# Patient Record
Sex: Male | Born: 1950
Health system: Southern US, Community
[De-identification: ages and names within clinical notes are randomized; demographics above are authoritative.]

## PROBLEM LIST (undated history)

## (undated) DIAGNOSIS — I10 Essential (primary) hypertension: Secondary | ICD-10-CM

## (undated) DIAGNOSIS — F1027 Alcohol dependence with alcohol-induced persisting dementia: Secondary | ICD-10-CM

## (undated) DIAGNOSIS — R569 Unspecified convulsions: Secondary | ICD-10-CM

## (undated) DIAGNOSIS — Z720 Tobacco use: Secondary | ICD-10-CM

## (undated) DIAGNOSIS — D649 Anemia, unspecified: Secondary | ICD-10-CM

## (undated) DIAGNOSIS — K2990 Gastroduodenitis, unspecified, without bleeding: Secondary | ICD-10-CM

## (undated) DIAGNOSIS — F1010 Alcohol abuse, uncomplicated: Secondary | ICD-10-CM

## (undated) DIAGNOSIS — R911 Solitary pulmonary nodule: Secondary | ICD-10-CM

## (undated) HISTORY — DX: Alcohol dependence with alcohol-induced persisting dementia: F10.27

## (undated) HISTORY — DX: Anemia, unspecified: D64.9

## (undated) HISTORY — DX: Unspecified convulsions: R56.9

## (undated) HISTORY — PX: ANKLE FRACTURE SURGERY: SHX122

## (undated) HISTORY — DX: Solitary pulmonary nodule: R91.1

## (undated) HISTORY — PX: APPENDECTOMY: SHX54

## (undated) HISTORY — DX: Alcohol abuse, uncomplicated: F10.10

## (undated) HISTORY — DX: Gastroduodenitis, unspecified, without bleeding: K29.90

## (undated) HISTORY — DX: Essential (primary) hypertension: I10

## (undated) HISTORY — DX: Tobacco use: Z72.0

---
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
## 2005-07-25 HISTORY — PX: COLONOSCOPY: SHX174

## 2015-01-23 HISTORY — PX: COLONOSCOPY: SHX174

## 2016-09-20 ENCOUNTER — Encounter: Payer: Self-pay | Admitting: Orthopaedic Surgery

## 2016-09-20 ENCOUNTER — Ambulatory Visit (INDEPENDENT_AMBULATORY_CARE_PROVIDER_SITE_OTHER): Payer: Medicare Other | Admitting: Orthopaedic Surgery

## 2016-09-20 ENCOUNTER — Ambulatory Visit (INDEPENDENT_AMBULATORY_CARE_PROVIDER_SITE_OTHER): Payer: Medicare Other

## 2016-09-20 VITALS — BP 181/111 | HR 97 | Temp 97.3°F | Ht 73.0 in | Wt 145.0 lb

## 2016-09-20 DIAGNOSIS — F17210 Nicotine dependence, cigarettes, uncomplicated: Secondary | ICD-10-CM | POA: Diagnosis not present

## 2016-09-20 DIAGNOSIS — M25561 Pain in right knee: Secondary | ICD-10-CM

## 2016-09-20 MED ORDER — NAPROXEN 500 MG PO TABS: 500.0000 mg | ORAL_TABLET | Freq: Two times a day (BID) | ORAL | 5 refills | Status: DC

## 2016-09-20 NOTE — Progress Notes (Signed)
Subjective:    Patient ID: Scott Jackson, male    DOB: October 27, 1950, 66 y.o.   MRN: [id number]  HPI He fell Friday, February 23rd and hurt his right knee.  He has had swelling and pain but no giving way.  He used ice and a knee sleeve and Tylenol which helped.  He is concerned about the swelling.  He has had problems with the knee on and off for years.  He has no redness, no numbness.   Review of Systems  HENT: Negative for congestion.   Respiratory: Negative for cough and shortness of breath.   Cardiovascular: Negative for chest pain and leg swelling.  Endocrine: Negative for cold intolerance.  Musculoskeletal: Positive for arthralgias, gait problem and joint swelling.  Allergic/Immunologic: Negative for environmental allergies.    He is a current smoker and not willing to quit at this time.  Past Medical History:  Diagnosis Date  . Hypertension     Past Surgical History:  Procedure Laterality Date  . ANKLE FRACTURE SURGERY    . intestininal blockage      No current outpatient prescriptions on file prior to visit.   No current facility-administered medications on file prior to visit.     Social History   Social History  . Marital status: Married    Spouse name: N/A  . Number of children: N/A  . Years of education: N/A   Occupational History  . Not on file.   Social History Main Topics  . Smoking status: Current Every Day Smoker  . Smokeless tobacco: Never Used  . Alcohol use Not on file  . Drug use: Unknown  . Sexual activity: Not on file   Other Topics Concern  . Not on file   Social History Narrative  . No narrative on file    Family History  Problem Relation Age of Onset  . Heart disease Sister     BP (!) 181/111   Pulse 97   Temp 97.3 F (36.3 C)   Ht 6\' 1"  (1.854 m)   Wt 145 lb (65.8 kg)   BMI 19.13 kg/m      Objective:   Physical Exam  Constitutional: He is oriented to person, place, and time. He appears well-developed and  well-nourished.  HENT:  Head: Normocephalic and atraumatic.  Eyes: Conjunctivae and EOM are normal. Pupils are equal, round, and reactive to light.  Neck: Normal range of motion. Neck supple.  Cardiovascular: Normal rate, regular rhythm and intact distal pulses.   Pulmonary/Chest: Effort normal.  Abdominal: Soft.  Musculoskeletal: He exhibits tenderness (he has pain of the right knee and a very large effusion.  ROM is 0 to 95 with pain.  The knee is stable.  there is no redness or distal edema.  NV intact.  Left knee negative.).  Neurological: He is alert and oriented to person, place, and time. He has normal reflexes. No cranial nerve deficit. He exhibits normal muscle tone. Coordination normal.  Skin: Skin is warm and dry.  Psychiatric: He has a normal mood and affect. His behavior is normal. Judgment and thought content normal.  Vitals reviewed.   X-rays were done of the right knee.      Assessment & Plan:   Encounter Diagnoses  Name Primary?  . Acute pain of right knee Yes  . Cigarette nicotine dependence without complication    PROCEDURE NOTE:  The patient request injection, verbal consent was obtained.  The right knee was prepped appropriately after time  out was performed.   Sterile technique was observed and anesthesia was provided by ethyl chloride and a 20-gauge needle was used to inject the knee area.  A 16-gauge needle was then used to aspirate the knee.  Color of fluid aspirated was bloody  Total cc's aspirated was 95.    Injection of 1 cc of Depo-Medrol 40 mg with several cc's of plain xylocaine was then performed.  A band aid dressing was applied.  The patient was advised to apply ice later today and tomorrow to the injection sight as needed.  Begin Naprosyn.  Return in two weeks.  Call if any problem.  Precautions discussed.  Electronically Signed Sanjuana Kava, MD 2/27/20189:50 AM

## 2016-09-20 NOTE — Patient Instructions (Addendum)
Steps to Quit Smoking Smoking tobacco can be bad for your health. It can also affect almost every organ in your body. Smoking puts you and people around you at risk for many serious long-lasting (chronic) diseases. Quitting smoking is hard, but it is one of the best things that you can do for your health. It is never too late to quit. What are the benefits of quitting smoking? When you quit smoking, you lower your risk for getting serious diseases and conditions. They can include:  Lung cancer or lung disease.  Heart disease.  Stroke.  Heart attack.  Not being able to have children (infertility).  Weak bones (osteoporosis) and broken bones (fractures). If you have coughing, wheezing, and shortness of breath, those symptoms may get better when you quit. You may also get sick less often. If you are pregnant, quitting smoking can help to lower your chances of having a baby of low birth weight. What can I do to help me quit smoking? Talk with your doctor about what can help you quit smoking. Some things you can do (strategies) include:  Quitting smoking totally, instead of slowly cutting back how much you smoke over a period of time.  Going to in-person counseling. You are more likely to quit if you go to many counseling sessions.  Using resources and support systems, such as:  Online chats with a counselor.  Phone quitlines.  Printed self-help materials.  Support groups or group counseling.  Text messaging programs.  Mobile phone apps or applications.  Taking medicines. Some of these medicines may have nicotine in them. If you are pregnant or breastfeeding, do not take any medicines to quit smoking unless your doctor says it is okay. Talk with your doctor about counseling or other things that can help you. Talk with your doctor about using more than one strategy at the same time, such as taking medicines while you are also going to in-person counseling. This can help make quitting  easier. What things can I do to make it easier to quit? Quitting smoking might feel very hard at first, but there is a lot that you can do to make it easier. Take these steps:  Talk to your family and friends. Ask them to support and encourage you.  Call phone quitlines, reach out to support groups, or work with a counselor.  Ask people who smoke to not smoke around you.  Avoid places that make you want (trigger) to smoke, such as:  Bars.  Parties.  Smoke-break areas at work.  Spend time with people who do not smoke.  Lower the stress in your life. Stress can make you want to smoke. Try these things to help your stress:  Getting regular exercise.  Deep-breathing exercises.  Yoga.  Meditating.  Doing a body scan. To do this, close your eyes, focus on one area of your body at a time from head to toe, and notice which parts of your body are tense. Try to relax the muscles in those areas.  Download or buy apps on your mobile phone or tablet that can help you stick to your quit plan. There are many free apps, such as QuitGuide from the CDC (Centers for Disease Control and Prevention). You can find more support from smokefree.gov and other websites. This information is not intended to replace advice given to you by your health care provider. Make sure you discuss any questions you have with your health care provider. Document Released: 05/07/2009 Document Revised: 03/08/2016 Document   Reviewed: 11/25/2014 Elsevier Interactive Patient Education  2017 [employer].   Note to work saying he was here today.

## 2016-10-05 ENCOUNTER — Encounter: Payer: Self-pay | Admitting: Orthopaedic Surgery

## 2016-10-05 ENCOUNTER — Ambulatory Visit (INDEPENDENT_AMBULATORY_CARE_PROVIDER_SITE_OTHER): Payer: Medicare Other | Admitting: Orthopaedic Surgery

## 2016-10-05 VITALS — BP 146/93 | HR 76 | Ht 73.0 in | Wt 140.0 lb

## 2016-10-05 DIAGNOSIS — M25561 Pain in right knee: Secondary | ICD-10-CM

## 2016-10-05 NOTE — Progress Notes (Signed)
Patient Scott Jackson, male DOB:May 12, 1951, 66 y.o. [id number]  Chief Complaint  Patient presents with  . Follow-up    RIGHT KNEE PAIN    HPI  Scott Jackson is a 66 y.o. male who had acute pain of the right knee. He had an injection last time.  He is significantly improved. He has no pain now, normal gait. He is pleased. HPI  Body mass index is 18.47 kg/m.  ROS  Review of Systems  HENT: Negative for congestion.   Respiratory: Negative for cough and shortness of breath.   Cardiovascular: Negative for chest pain and leg swelling.  Endocrine: Negative for cold intolerance.  Musculoskeletal: Positive for arthralgias, gait problem and joint swelling.  Allergic/Immunologic: Negative for environmental allergies.    Past Medical History:  Diagnosis Date  . Hypertension     Past Surgical History:  Procedure Laterality Date  . ANKLE FRACTURE SURGERY    . intestininal blockage      Family History  Problem Relation Age of Onset  . Heart disease Sister     Social History Social History  Substance Use Topics  . Smoking status: Current Every Day Smoker  . Smokeless tobacco: Never Used  . Alcohol use Not on file    No Known Allergies  Current Outpatient Prescriptions  Medication Sig Dispense Refill  . amLODipine (NORVASC) 5 MG tablet 5 mg.   0  . aspirin 81 MG chewable tablet Chew by mouth daily.    Marland Kitchen lisinopril (PRINIVIL,ZESTRIL) 20 MG tablet 20 mg.   0  . naproxen (NAPROSYN) 500 MG tablet Take 1 tablet (500 mg total) by mouth 2 (two) times daily with a meal. 60 tablet 5   No current facility-administered medications for this visit.      Physical Exam  Blood pressure (!) 146/93, pulse 76, height 6\' 1"  (1.854 m), weight 140 lb (63.5 kg).  Constitutional: overall normal hygiene, normal nutrition, well developed, normal grooming, normal body habitus. Assistive device:none  Musculoskeletal: gait and station Limp none, muscle tone and strength are normal, no  tremors or atrophy is present.  .  Neurological: coordination overall normal.  Deep tendon reflex/nerve stretch intact.  Sensation normal.  Cranial nerves II-XII intact.   Skin:   Normal overall no scars, lesions, ulcers or rashes. No psoriasis.  Psychiatric: Alert and oriented x 3.  Recent memory intact, remote memory unclear.  Normal mood and affect. Well groomed.  Good eye contact.  Cardiovascular: overall no swelling, no varicosities, no edema bilaterally, normal temperatures of the legs and arms, no clubbing, cyanosis and good capillary refill.  Lymphatic: palpation is normal.  He has full motion of the right knee and no pain.  Exam negative.  The patient has been educated about the nature of the problem(s) and counseled on treatment options.  The patient appeared to understand what I have discussed and is in agreement with it.  Encounter Diagnosis  Name Primary?  . Acute pain of right knee Yes    PLAN Call if any problems.  Precautions discussed.  Continue current medications.   Return to clinic PRN   Electronically Signed Sanjuana Kava, MD 3/14/20183:32 PM

## 2016-10-05 NOTE — Patient Instructions (Signed)
Steps to Quit Smoking Smoking tobacco can be bad for your health. It can also affect almost every organ in your body. Smoking puts you and people around you at risk for many serious long-lasting (chronic) diseases. Quitting smoking is hard, but it is one of the best things that you can do for your health. It is never too late to quit. What are the benefits of quitting smoking? When you quit smoking, you lower your risk for getting serious diseases and conditions. They can include:  Lung cancer or lung disease.  Heart disease.  Stroke.  Heart attack.  Not being able to have children (infertility).  Weak bones (osteoporosis) and broken bones (fractures). If you have coughing, wheezing, and shortness of breath, those symptoms may get better when you quit. You may also get sick less often. If you are pregnant, quitting smoking can help to lower your chances of having a baby of low birth weight. What can I do to help me quit smoking? Talk with your doctor about what can help you quit smoking. Some things you can do (strategies) include:  Quitting smoking totally, instead of slowly cutting back how much you smoke over a period of time.  Going to in-person counseling. You are more likely to quit if you go to many counseling sessions.  Using resources and support systems, such as:  Online chats with a counselor.  Phone quitlines.  Printed self-help materials.  Support groups or group counseling.  Text messaging programs.  Mobile phone apps or applications.  Taking medicines. Some of these medicines may have nicotine in them. If you are pregnant or breastfeeding, do not take any medicines to quit smoking unless your doctor says it is okay. Talk with your doctor about counseling or other things that can help you. Talk with your doctor about using more than one strategy at the same time, such as taking medicines while you are also going to in-person counseling. This can help make quitting  easier. What things can I do to make it easier to quit? Quitting smoking might feel very hard at first, but there is a lot that you can do to make it easier. Take these steps:  Talk to your family and friends. Ask them to support and encourage you.  Call phone quitlines, reach out to support groups, or work with a counselor.  Ask people who smoke to not smoke around you.  Avoid places that make you want (trigger) to smoke, such as:  Bars.  Parties.  Smoke-break areas at work.  Spend time with people who do not smoke.  Lower the stress in your life. Stress can make you want to smoke. Try these things to help your stress:  Getting regular exercise.  Deep-breathing exercises.  Yoga.  Meditating.  Doing a body scan. To do this, close your eyes, focus on one area of your body at a time from head to toe, and notice which parts of your body are tense. Try to relax the muscles in those areas.  Download or buy apps on your mobile phone or tablet that can help you stick to your quit plan. There are many free apps, such as QuitGuide from the CDC (Centers for Disease Control and Prevention). You can find more support from smokefree.gov and other websites. This information is not intended to replace advice given to you by your health care provider. Make sure you discuss any questions you have with your health care provider. Document Released: 05/07/2009 Document Revised: 03/08/2016 Document   Reviewed: 11/25/2014 Elsevier Interactive Patient Education  2017 [employer] Inc[employer]  

## 2017-08-18 ENCOUNTER — Ambulatory Visit (INDEPENDENT_AMBULATORY_CARE_PROVIDER_SITE_OTHER)
Admission: RE | Admit: 2017-08-18 | Discharge: 2017-08-18 | Disposition: A | Discharge status: Home / Self Care | Payer: Medicare PPO | Source: Ambulatory Visit | Attending: Pulmonary Disease | Admitting: Pulmonary Disease

## 2017-08-18 ENCOUNTER — Ambulatory Visit (INDEPENDENT_AMBULATORY_CARE_PROVIDER_SITE_OTHER): Payer: Medicare PPO | Admitting: Pulmonary Disease

## 2017-08-18 ENCOUNTER — Encounter: Payer: Self-pay | Admitting: Pulmonary Disease

## 2017-08-18 VITALS — BP 144/90 | HR 89 | Ht 73.0 in | Wt 145.0 lb

## 2017-08-18 DIAGNOSIS — R0602 Shortness of breath: Secondary | ICD-10-CM

## 2017-08-18 MED ORDER — FLUTICASONE PROPIONATE 50 MCG/ACT NA SUSP: 2.0000 | Freq: Every day | NASAL | 2 refills | Status: DC

## 2017-08-18 MED ORDER — CHLORPHENIRAMINE MALEATE 4 MG PO TABS: 4.0000 mg | ORAL_TABLET | Freq: Three times a day (TID) | ORAL | 0 refills | Status: DC

## 2017-08-18 NOTE — Patient Instructions (Signed)
Please schedule you for pulmonary function test and chest x-ray Continue to work on smoking cessation You can use chlorpheniramine 3 times a day over-the-counter and Flonase nasal spray for sinus congestion and postnasal drip Follow-up in 1 month

## 2017-08-18 NOTE — Progress Notes (Signed)
Scott Jackson    [id number]    1951-05-08  Primary Care Physician:Patient, No Pcp Per  Referring Physician: No referring provider defined for this encounter.  Chief complaint: Consult for evaluation of the lung  HPI: 67 year old with extensive smoking history, hypertension.  Complains of sinus congestion, postnasal drip.  Throat congestion.  Denies any cough, sputum production, fevers, chills.  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  Continues to smoke half pack per day Travel History: Not significant  Outpatient Encounter Medications as of 08/18/2017  Medication Sig  . amLODipine (NORVASC) 5 MG tablet 5 mg.   . aspirin 81 MG chewable tablet Chew by mouth daily.  Marland Kitchen lisinopril (PRINIVIL,ZESTRIL) 20 MG tablet 20 mg.   . naproxen (NAPROSYN) 500 MG tablet Take 1 tablet (500 mg total) by mouth 2 (two) times daily with a meal.   No facility-administered encounter medications on file as of 08/18/2017.     Allergies as of 08/18/2017  . (No Known Allergies)    Past Medical History:  Diagnosis Date  . Hypertension     Past Surgical History:  Procedure Laterality Date  . ANKLE FRACTURE SURGERY    . intestininal blockage      Family History  Problem Relation Age of Onset  . Heart disease Sister     Social History   Socioeconomic History  . Marital status: Married    Spouse name: Not on file  . Number of children: Not on file  . Years of education: Not on file  . Highest education level: Not on file  Social Needs  . Financial resource strain: Not on file  . Food insecurity - worry: Not on file  . Food insecurity - inability: Not on file  . Transportation needs - medical: Not on file  . Transportation  needs - non-medical: Not on file  Occupational History  . Not on file  Tobacco Use  . Smoking status: Current Every Day Smoker    Packs/day: 0.50    Years: 40.00    Pack years: 20.00  . Smokeless tobacco: Never Used  Substance and Sexual Activity  . Alcohol use: Yes    Alcohol/week: 2.4 oz    Types: 4 Shots of liquor per week  . Drug use: No  . Sexual activity: Not on file  Other Topics Concern  . Not on file  Social History Narrative  . Not on file   Review of systems: Review of Systems  Constitutional: Negative for fever and chills.  HENT: Negative.   Eyes: Negative for blurred vision.  Respiratory: as per HPI  Cardiovascular: Negative for chest pain and palpitations.  Gastrointestinal: Negative for vomiting, diarrhea, blood per rectum. Genitourinary: Negative for dysuria, urgency, frequency and hematuria.  Musculoskeletal: Negative for myalgias, back pain and joint pain.  Skin: Negative for itching and rash.  Neurological: Negative for dizziness, tremors, focal weakness, seizures and loss of consciousness.  Endo/Heme/Allergies: Negative for environmental allergies.  Psychiatric/Behavioral: Negative for depression, suicidal ideas and hallucinations.  All other systems reviewed and are negative.  Physical Exam: Blood pressure (!) 144/90, pulse 89, SpO2 98 %. Gen:      No acute distress HEENT:  EOMI, sclera anicteric  Neck:     No masses; no thyromegaly Lungs:    Clear to auscultation bilaterally; normal respiratory effort CV:         Regular rate and rhythm; no murmurs Abd:      + bowel sounds; soft, non-tender; no palpable masses, no distension Ext:    No edema; adequate peripheral perfusion Skin:      Warm and dry; no rash Neuro: alert and oriented x 3 Psych: normal mood and affect  Data Reviewed:   Assessment:  Evaluation for COPD Schedule chest x-ray and pulmonary function test. He is not very symptomatic and will avoid putting him on standing inhalers for  now. I have advised him to use over-the-counter antihistamine and Flonase nasal spray for postnasal drip and sinus congestion.  Active smoker Smoking cessation encouraged.  He is not interested in quitting.  Plan/Recommendations: - Chest x-ray, PFTs - Smoking cessation.  Marshell Garfinkel MD [facility] Pager [phone] 08/18/2017, 1:34 PM  CC: No ref. provider found

## 2017-08-22 ENCOUNTER — Telehealth: Payer: Self-pay | Admitting: Pulmonary Disease

## 2017-08-22 NOTE — Telephone Encounter (Signed)
Notes recorded by Marshell Garfinkel, MD on 08/20/2017 at 9:53 AM EST Please let the pt know that the CXR shows mild scarring that is likely chronic. No lung infiltrate or nodules  Pt's daughter [name] is aware of results and voiced her understanding. Nothing further is needed

## 2017-09-21 ENCOUNTER — Ambulatory Visit: Payer: Medicare PPO | Admitting: Pulmonary Disease

## 2017-10-19 ENCOUNTER — Encounter: Payer: Self-pay | Admitting: Pulmonary Disease

## 2017-10-19 ENCOUNTER — Ambulatory Visit: Payer: Medicare HMO | Admitting: Pulmonary Disease

## 2017-10-19 ENCOUNTER — Ambulatory Visit (INDEPENDENT_AMBULATORY_CARE_PROVIDER_SITE_OTHER): Payer: Medicare HMO | Admitting: Pulmonary Disease

## 2017-10-19 VITALS — BP 130/70 | HR 75 | Ht 72.0 in | Wt 141.0 lb

## 2017-10-19 DIAGNOSIS — R0602 Shortness of breath: Secondary | ICD-10-CM

## 2017-10-19 DIAGNOSIS — F17210 Nicotine dependence, cigarettes, uncomplicated: Secondary | ICD-10-CM | POA: Diagnosis not present

## 2017-10-19 DIAGNOSIS — F17200 Nicotine dependence, unspecified, uncomplicated: Secondary | ICD-10-CM

## 2017-10-19 DIAGNOSIS — R69 Illness, unspecified: Secondary | ICD-10-CM | POA: Diagnosis not present

## 2017-10-19 LAB — PULMONARY FUNCTION TEST
DL/VA % PRED: 87 %
DL/VA: 4.14 ml/min/mmHg/L
DLCO unc % pred: 76 %
DLCO unc: 26.7 ml/min/mmHg
FEF 25-75 POST: 2.1 L/s
FEF 25-75 Pre: 1.85 L/sec
FEF2575-%Change-Post: 13 %
FEF2575-%PRED-POST: 74 %
FEF2575-%PRED-PRE: 65 %
FEV1-%Change-Post: 2 %
FEV1-%Pred-Post: 87 %
FEV1-%Pred-Pre: 85 %
FEV1-PRE: 2.75 L
FEV1-Post: 2.83 L
FEV1FVC-%CHANGE-POST: 4 %
FEV1FVC-%PRED-PRE: 91 %
FEV6-%Change-Post: 0 %
FEV6-%Pred-Post: 94 %
FEV6-%Pred-Pre: 95 %
FEV6-Post: 3.84 L
FEV6-Pre: 3.87 L
FEV6FVC-%Change-Post: 0 %
FEV6FVC-%Pred-Post: 103 %
FEV6FVC-%Pred-Pre: 103 %
FVC-%Change-Post: -1 %
FVC-%PRED-PRE: 92 %
FVC-%Pred-Post: 91 %
FVC-POST: 3.85 L
FVC-PRE: 3.89 L
POST FEV1/FVC RATIO: 74 %
PRE FEV1/FVC RATIO: 71 %
Post FEV6/FVC ratio: 100 %
Pre FEV6/FVC Ratio: 99 %
RV % pred: 121 %
RV: 3.03 L
TLC % pred: 91 %
TLC: 6.76 L

## 2017-10-19 NOTE — Progress Notes (Signed)
PFT done today. 

## 2017-10-19 NOTE — Patient Instructions (Addendum)
Your lung function test show very mild changes of COPD Please work on quitting smoking as we do not want your lung function to decline further We will refer you for low-dose screening CT of the chest Follow-up in 6 months.

## 2017-10-19 NOTE — Progress Notes (Signed)
Scott Jackson    [id number]    Mar 20, 1951  Primary Care Physician:Pradhan, Tilden Fossa, MD  Chief complaint: Follow-up for COPD  HPI: 67 year old with extensive smoking history, hypertension.  Complains of sinus congestion, postnasal drip.  Throat congestion.  Denies any cough, sputum production, fevers, chills.  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  Continues to smoke half pack per day Travel History: Not significant  Interim history: He remains asymptomatic with no issues.  He is here for review of PFTs  Outpatient Encounter Medications as of 10/19/2017  Medication Sig  . amLODipine (NORVASC) 5 MG tablet 5 mg.   . aspirin 81 MG chewable tablet Chew by mouth daily.  . chlorpheniramine (CHLOR-TRIMETON) 4 MG tablet Take 1 tablet (4 mg total) by mouth 3 (three) times daily.  . fluticasone (FLONASE) 50 MCG/ACT nasal spray Place 2 sprays into both nostrils daily.  Marland Kitchen lisinopril (PRINIVIL,ZESTRIL) 20 MG tablet 20 mg.   . naproxen (NAPROSYN) 500 MG tablet Take 1 tablet (500 mg total) by mouth 2 (two) times daily with a meal.   No facility-administered encounter medications on file as of 10/19/2017.     Allergies as of 10/19/2017  . (No Known Allergies)    Past Medical History:  Diagnosis Date  . Hypertension     Past Surgical History:  Procedure Laterality Date  . ANKLE FRACTURE SURGERY    . intestininal blockage      Family History  Problem Relation Age of Onset  . Heart disease Sister     Social History   Socioeconomic History  . Marital status: Married    Spouse name: Not on file  . Number of children: Not on file  . Years of education: Not on file  . Highest education level: Not on file    Occupational History  . Not on file  Social Needs  . Financial resource strain: Not on file  . Food insecurity:    Worry: Not on file    Inability: Not on file  . Transportation needs:    Medical: Not on file    Non-medical: Not on file  Tobacco Use  . Smoking status: Current Every Day Smoker    Packs/day: 0.50    Years: 40.00    Pack years: 20.00  . Smokeless tobacco: Never Used  Substance and Sexual Activity  . Alcohol use: Yes    Alcohol/week: 2.4 oz    Types: 4 Shots of liquor per week  . Drug use: No  . Sexual activity: Not on file  Lifestyle  . Physical activity:    Days per week: Not on file    Minutes per session: Not on file  . Stress: Not on file  Relationships  . Social connections:    Talks on phone: Not on file    Gets together: Not on file    Attends religious service: Not on file    Active member of club or organization: Not on file    Attends meetings of clubs or organizations: Not on file    Relationship status: Not on file  . Intimate partner violence:    Fear of current or ex partner: Not on file  Emotionally abused: Not on file    Physically abused: Not on file    Forced sexual activity: Not on file  Other Topics Concern  . Not on file  Social History Narrative  . Not on file   Review of systems: Review of Systems  Constitutional: Negative for fever and chills.  HENT: Negative.   Eyes: Negative for blurred vision.  Respiratory: as per HPI  Cardiovascular: Negative for chest pain and palpitations.  Gastrointestinal: Negative for vomiting, diarrhea, blood per rectum. Genitourinary: Negative for dysuria, urgency, frequency and hematuria.  Musculoskeletal: Negative for myalgias, back pain and joint pain.  Skin: Negative for itching and rash.  Neurological: Negative for dizziness, tremors, focal weakness, seizures and loss of consciousness.  Endo/Heme/Allergies: Negative for environmental allergies.  Psychiatric/Behavioral: Negative for  depression, suicidal ideas and hallucinations.  All other systems reviewed and are negative.  Physical Exam: Blood pressure 130/70, pulse 75, height 6' (1.829 m), weight 141 lb (64 kg), SpO2 97 %. Gen:      No acute distress HEENT:  EOMI, sclera anicteric Neck:     No masses; no thyromegaly Lungs:    Clear to auscultation bilaterally; normal respiratory effort CV:         Regular rate and rhythm; no murmurs Abd:      + bowel sounds; soft, non-tender; no palpable masses, no distension Ext:    No edema; adequate peripheral perfusion Skin:      Warm and dry; no rash Neuro: alert and oriented x 3 Psych: normal mood and affect  Data Reviewed: PFTs 10/19/17 FVC 3.85 [91%], FEV1 2.83 [87%], F/F 74, TLC 91%, RV/TLC 124%, DLCO 76% Minimal obstruction and diffusion defect  Chest x-ray 08/18/17-hyperinflation, biapical scarring.  No acute abnormality I have reviewed the images personally   Assessment:  COPD PFTs reviewed which shows only minimal obstructive changes which is surprising given his smoking history No need for inhalers as he is asymptomatic  Active smoker Smoking cessation encouraged.  Time spent counseling-5 minutes Schedule for low-dose screening CT  Plan/Recommendations: - Low-dose screening CT - Smoking cessation.  Marshell Garfinkel MD [facility] Pager [phone] 10/19/2017, 4:21 PM

## 2017-11-02 ENCOUNTER — Telehealth: Payer: Self-pay | Admitting: Pulmonary Disease

## 2017-11-03 NOTE — Telephone Encounter (Signed)
LMTC x 1  

## 2017-11-08 ENCOUNTER — Other Ambulatory Visit: Payer: Self-pay | Admitting: Acute Care

## 2017-11-08 DIAGNOSIS — F17210 Nicotine dependence, cigarettes, uncomplicated: Secondary | ICD-10-CM

## 2017-11-08 DIAGNOSIS — Z122 Encounter for screening for malignant neoplasm of respiratory organs: Secondary | ICD-10-CM

## 2017-11-08 NOTE — Telephone Encounter (Signed)
LMTC x 1 - Will close this message and refer to referral notes 

## 2017-11-08 NOTE — Telephone Encounter (Signed)
Pt daughter, [name], is returning call. Cb is [phone].

## 2017-11-23 ENCOUNTER — Other Ambulatory Visit: Payer: Self-pay

## 2017-11-23 ENCOUNTER — Telehealth: Payer: Self-pay | Admitting: Clinical

## 2017-11-23 ENCOUNTER — Ambulatory Visit (INDEPENDENT_AMBULATORY_CARE_PROVIDER_SITE_OTHER): Payer: Managed Care, Other (non HMO) | Admitting: Family Medicine

## 2017-11-23 ENCOUNTER — Encounter: Payer: Self-pay | Admitting: Family Medicine

## 2017-11-23 ENCOUNTER — Telehealth (INDEPENDENT_AMBULATORY_CARE_PROVIDER_SITE_OTHER): Payer: Managed Care, Other (non HMO) | Admitting: Clinical

## 2017-11-23 VITALS — BP 148/86 | HR 77 | Temp 98.1°F | Resp 14 | Ht 73.0 in | Wt 141.1 lb

## 2017-11-23 DIAGNOSIS — R5383 Other fatigue: Secondary | ICD-10-CM

## 2017-11-23 DIAGNOSIS — F1010 Alcohol abuse, uncomplicated: Secondary | ICD-10-CM

## 2017-11-23 DIAGNOSIS — R739 Hyperglycemia, unspecified: Secondary | ICD-10-CM | POA: Diagnosis not present

## 2017-11-23 DIAGNOSIS — Z113 Encounter for screening for infections with a predominantly sexual mode of transmission: Secondary | ICD-10-CM

## 2017-11-23 DIAGNOSIS — Z7141 Alcohol abuse counseling and surveillance of alcoholic: Secondary | ICD-10-CM

## 2017-11-23 DIAGNOSIS — F1020 Alcohol dependence, uncomplicated: Secondary | ICD-10-CM

## 2017-11-23 DIAGNOSIS — E785 Hyperlipidemia, unspecified: Secondary | ICD-10-CM | POA: Diagnosis not present

## 2017-11-23 DIAGNOSIS — Z1159 Encounter for screening for other viral diseases: Secondary | ICD-10-CM

## 2017-11-23 DIAGNOSIS — I10 Essential (primary) hypertension: Secondary | ICD-10-CM | POA: Diagnosis not present

## 2017-11-23 DIAGNOSIS — R69 Illness, unspecified: Secondary | ICD-10-CM | POA: Diagnosis not present

## 2017-11-23 DIAGNOSIS — Z23 Encounter for immunization: Secondary | ICD-10-CM | POA: Diagnosis not present

## 2017-11-23 LAB — COMPLETE METABOLIC PANEL WITH GFR
AG RATIO: 1.6 (calc) (ref 1.0–2.5)
ALBUMIN MSPROF: 4.7 g/dL (ref 3.6–5.1)
ALT: 14 U/L (ref 9–46)
AST: 27 U/L (ref 10–35)
Alkaline phosphatase (APISO): 40 U/L (ref 40–115)
BUN: 21 mg/dL (ref 7–25)
CO2: 27 mmol/L (ref 20–32)
CREATININE: 1.05 mg/dL (ref 0.70–1.25)
Calcium: 10.2 mg/dL (ref 8.6–10.3)
Chloride: 100 mmol/L (ref 98–110)
GFR, Est African American: 85 mL/min/{1.73_m2} (ref >=60)
GFR, Est Non African American: 74 mL/min/{1.73_m2} (ref >=60)
GLOBULIN: 2.9 g/dL (ref 1.9–3.7)
Glucose, Bld: 90 mg/dL (ref 65–139)
POTASSIUM: 4.6 mmol/L (ref 3.5–5.3)
SODIUM: 137 mmol/L (ref 135–146)
Total Bilirubin: 0.3 mg/dL (ref 0.2–1.2)
Total Protein: 7.6 g/dL (ref 6.1–8.1)

## 2017-11-23 LAB — CBC WITH DIFFERENTIAL/PLATELET
BASOS ABS: 31 {cells}/uL (ref 0–200)
Basophils Relative: 0.7 %
EOS ABS: 22 {cells}/uL (ref 15–500)
EOS PCT: 0.5 %
HEMATOCRIT: 38.4 % — AB (ref 38.5–50.0)
HEMOGLOBIN: 13.1 g/dL — AB (ref 13.2–17.1)
LYMPHS ABS: 2380 {cells}/uL (ref 850–3900)
MCH: 32 pg (ref 27.0–33.0)
MCHC: 34.1 g/dL (ref 32.0–36.0)
MCV: 93.7 fL (ref 80.0–100.0)
MPV: 9.2 fL (ref 7.5–12.5)
Monocytes Relative: 7.3 %
NEUTROS ABS: 1646 {cells}/uL (ref 1500–7800)
Neutrophils Relative %: 37.4 %
Platelets: 416 10*3/uL — ABNORMAL HIGH (ref 140–400)
RBC: 4.1 10*6/uL — ABNORMAL LOW (ref 4.20–5.80)
RDW: 12.9 % (ref 11.0–15.0)
Total Lymphocyte: 54.1 %
WBC: 4.4 10*3/uL (ref 3.8–10.8)
WBCMIX: 321 {cells}/uL (ref 200–950)

## 2017-11-23 LAB — TSH: TSH: 1.15 m[IU]/L (ref 0.40–4.50)

## 2017-11-23 LAB — LIPID PANEL
CHOL/HDL RATIO: 2.1 (calc) (ref <5.0)
Cholesterol: 207 mg/dL — ABNORMAL HIGH (ref <200)
HDL: 97 mg/dL (ref >40)
LDL CHOLESTEROL (CALC): 86 mg/dL
Non-HDL Cholesterol (Calc): 110 mg/dL (calc) (ref <130)
Triglycerides: 139 mg/dL (ref <150)

## 2017-11-23 NOTE — Telephone Encounter (Signed)
This [facility] specialist left message to call back with name and contact information for daughter to coordinate care.  This [facility] specialist will follow up tomorrow.

## 2017-11-23 NOTE — BH Specialist Note (Addendum)
[facility] Initial Clinical Assessment  MRN: [id number] NAME: Scott Jackson Date: 11/23/17  Start time: 11:50 AM  End time: 12:20pm Total time: 30 minutes  Type of Contact: Type of Contact: Video Visit Initial Contact  Patient consent obtained: Patient consent obtained for Virtual Visit: Yes Reason for Visit today: Reason for Your Call/Visit Today: Wants to decrease alcohol use  Scott Jackson - patient's daughter, was present during all of the video visit  Treatment History Patient recently received Inpatient Treatment: Have You Recently Been in Any Inpatient Treatment (Hospital/Detox/Crisis Center/28-Day Program)?: No  Facility/Program:    Date of discharge:   Patient currently being seen by therapist/psychiatrist: Do You Currently Have a Therapist/Psychiatrist?: No Patient currently receiving the following services: Patient Currently Receiving the Following Services:: Currently not receiving any services  Past Psychiatric History/Hospitalization(s): Anxiety: No Bipolar Disorder: Need to assess Depression: No Mania: Not assessed Psychosis: Need to assess Schizophrenia: Need to assess Personality Disorder: Need to assess Hospitalization for psychiatric illness: No History of Electroconvulsive Shock Therapy: No Prior Suicide Attempts: No  Clinical Assessment:  PHQ-9 Assessments: Depression screen [facility] 2/9 11/23/2017  Decreased Interest 0  Down, Depressed, Hopeless 0  PHQ - 2 Score 0    GAD-7 Assessments: No flowsheet data found.   Social Functioning Social maturity: Social Maturity: Responsible Social judgement: Social Judgement: Normal  Stress Current stressors: Current Stressors: Family death(Father's death many years ago) Familial stressors: Familial Stressors: None Sleep: Sleep: No problems Appetite: Appetite: Decreased Coping ability: Coping ability: Other (Comment)(Increased alcohol use since father's death) Patient taking medications as prescribed:  Patient taking medications as prescribed: Yes  Current medications:  Outpatient Encounter Medications as of 11/23/2017  Medication Sig  . amLODipine (NORVASC) 5 MG tablet 5 mg.   . aspirin 81 MG chewable tablet Chew by mouth daily.  . chlorpheniramine (CHLOR-TRIMETON) 4 MG tablet Take 1 tablet (4 mg total) by mouth 3 (three) times daily. (Patient not taking: Reported on 11/23/2017)  . Cholecalciferol (VITAMIN D3) 5000 units CAPS Take 1 capsule by mouth every other day.  . fluticasone (FLONASE) 50 MCG/ACT nasal spray Place 2 sprays into both nostrils daily.  Marland Kitchen lisinopril (PRINIVIL,ZESTRIL) 20 MG tablet 20 mg.   . lisinopril-hydrochlorothiazide (PRINZIDE,ZESTORETIC) 10-12.5 MG tablet Take 1 tablet by mouth every morning.  . naproxen (NAPROSYN) 500 MG tablet Take 1 tablet (500 mg total) by mouth 2 (two) times daily with a meal.   No facility-administered encounter medications on file as of 11/23/2017.     Self-harm Behaviors Risk Assessment Self-harm risk factors: Self-harm risk factors: Substance use disorder(Increased alcohol use) Patient endorses recent thoughts of harming self: Have you recently had any thoughts about harming yourself?: No  [place] Suicide Severity Rating Scale: No flowsheet data found.  Danger to Others Risk Assessment Danger to others risk factors: Danger to Others Risk Factors: No risk factors noted Patient endorses recent thoughts of harming others: Notification required: No need or identified person  Dynamic Appraisal of Situational Aggression (DASA): No flowsheet data found.  Substance Use Assessment Patient recently consumed alcohol: Have you recently consumed alcohol?: Yes  Alcohol Use Disorder Identification Test (AUDIT): No flowsheet data found. Patient recently used drugs: Have you recently used any drugs?: No  Opioid Risk Assessment:  Patient is concerned about dependence or abuse of substances: Does patient seem concerned about dependence or abuse of any  substance?: No  Goals, Interventions and Follow-up Plan Goals: Decrease alcohol use Interventions: Functional Assessment of ADLs and Link to [employer]  Summary of Clinical Assessment Summary:   Scott Jackson is a 67 yo male who has been using alcohol to cope with his father's death over 30 years ago. Scott Jackson also smokes cigarettes about half a pack per day, for the last 20 years.  Scott Jackson lives with 20 yo mother.  Scott Jackson has an adult daughter who assists in his care and has a 38 yo granddaughter who lives in [place].  Scott Jackson' daughter was supportive and willing to provide assistance with his treatment.  Scott Jackson works for a [employer] Monday - Wednesdays, he denied any alcohol use while at work.  During video call, Scott Jackson minimized his alcohol consumption each day to half of a fifth.  After talking with Dr. Mannie Stabile, Scott Jackson daughter reported during MD visit that Scott Jackson consumes about 2 liters of vodka each week and that he consumes alcohol every day.  Scott Jackson denied any depressive symptoms, denied any history or current SI/HI.  After consultation with Dr. Mannie Stabile after the video visit, Dr. Mannie Stabile was concerned about the patient stating he gets shaky, agitated, and sweaty when he stops drinking. Dr. Mannie Stabile would recommend inpatient services due to high risk for seizures.  Follow-up Plan:  Scott Jackson to connect [name] to appropriate services to address his alcohol use. [facility] will follow up with pt/family by tomorrow 11/23/17.   Scott Francisco Capuchin, LCSW

## 2017-11-23 NOTE — Progress Notes (Signed)
Patient ID: Scott Jackson, male    DOB: [birth year], 67 y.o.   MRN: [id number]  Chief Complaint  Patient presents with  . Establish Care    Allergies Varenicline  Subjective:   Scott Jackson is a 67 y.o. male who presents to [facility] today.  HPI Mr. Bomar presents to the office today.  He is accompanied by his daughter who brings him in for evaluation.  She reports that he has previously been seen by a primary care physician in [place].  However they did not accept his insurance and so he was getting very large bills.  He comes in today to establish care with a doctor who would be in his network.  She reports that she is concerned about his alcohol use.  She takes him to the liquor store twice a week and he goes in and purchases two, liter bottles of vodka.  He drinks the vodka throughout the week and has been drinking this much alcohol since 1983.  He has never tried to quit.  He drinks when he gets home from work or if he does not have to work.  He lives with his mother.  The alcohol use and tobacco use started after the death of his father in 63.  Per his daughter's report he had never drank alcohol or smoke tobacco products prior to this.  His alcohol use has caused the dissolution of his marriage.  His daughter reports that if he does not drink alcohol he will get shaky and sweaty.  He denies any history of seizures.  He denies any other drug use.  He reports that he would be interested in getting some help to stop drinking alcohol.  He reports that he understands if he does not cut down on his alcohol use and stop smoking that he will likely die.  He is lost weight over the past year.  He does not always eat as well as he should.  He denies any fevers, chills, nausea, vomiting, or diarrhea.  He denies any hematemesis or hemoptysis.  He does smoke over one pack per day.  He denies any skin rashes.  He reports that he might of had a high blood sugar in the past but he has  not diabetic.  He does report that he takes his blood pressure medication on a daily basis.  He denies any history of heart attack or stroke.  He denies any chest pain.  He reports that he is not short of breath.  He does occasionally get some dyspnea on exertion.  He is followed by pulmonology.  He is not sure if he has had his pneumonia shots.  He does not take any vitamins or supplements.  He denies any myalgias.  He reports that he urinates fine.  Denies any blood in his urine.  Patient can get tired and have low energy at times.    Past Medical History:  Diagnosis Date  . Hypertension     Past Surgical History:  Procedure Laterality Date  . ANKLE FRACTURE SURGERY    . APPENDECTOMY    . intestininal blockage      Family History  Problem Relation Age of Onset  . Hypertension Mother   . Fibromyalgia Daughter   . Lupus Daughter   . Anxiety disorder Daughter   . Post-traumatic stress disorder Daughter   . Asthma Daughter   . Breast cancer Sister   . Breast cancer Sister  Social History   Socioeconomic History  . Marital status: Married    Spouse name: Not on file  . Number of children: Not on file  . Years of education: Not on file  . Highest education level: Not on file  Occupational History  . Not on file  Social Needs  . Financial resource strain: Not on file  . Food insecurity:    Worry: Not on file    Inability: Not on file  . Transportation needs:    Medical: Not on file    Non-medical: Not on file  Tobacco Use  . Smoking status: Current Every Day Smoker    Packs/day: 0.50    Years: 40.00    Pack years: 20.00  . Smokeless tobacco: Never Used  Substance and Sexual Activity  . Alcohol use: Yes    Alcohol/week: 2.4 oz    Types: 4 Shots of liquor per week  . Drug use: No  . Sexual activity: Yes  Lifestyle  . Physical activity:    Days per week: Not on file    Minutes per session: Not on file  . Stress: Not on file  Relationships  . Social  connections:    Talks on phone: Not on file    Gets together: Not on file    Attends religious service: Not on file    Active member of club or organization: Not on file    Attends meetings of clubs or organizations: Not on file    Relationship status: Not on file  Other Topics Concern  . Not on file  Social History Narrative   Works at [employer] in [place], [place].   Separated.   [place], hunting.    Current Outpatient Medications on File Prior to Visit  Medication Sig Dispense Refill  . amLODipine (NORVASC) 5 MG tablet 5 mg.   0  . aspirin 81 MG chewable tablet Chew by mouth daily.    . Cholecalciferol (VITAMIN D3) 5000 units CAPS Take 1 capsule by mouth every other day.    . fluticasone (FLONASE) 50 MCG/ACT nasal spray Place 2 sprays into both nostrils daily. 16 g 2  . lisinopril-hydrochlorothiazide (PRINZIDE,ZESTORETIC) 10-12.5 MG tablet Take 1 tablet by mouth every morning.  1  . naproxen (NAPROSYN) 500 MG tablet Take 1 tablet (500 mg total) by mouth 2 (two) times daily with a meal. 60 tablet 5  . chlorpheniramine (CHLOR-TRIMETON) 4 MG tablet Take 1 tablet (4 mg total) by mouth 3 (three) times daily. (Patient not taking: Reported on 11/23/2017) 180 tablet 0  . lisinopril (PRINIVIL,ZESTRIL) 20 MG tablet 20 mg.   0   No current facility-administered medications on file prior to visit.     Review of Systems  Constitutional: Negative for appetite change, chills, diaphoresis, fever and unexpected weight change.  HENT: Negative for congestion, facial swelling, postnasal drip, rhinorrhea, sinus pressure, tinnitus, trouble swallowing and voice change.   Eyes: Negative for visual disturbance.  Respiratory: Negative for cough, chest tightness, shortness of breath and wheezing.   Cardiovascular: Negative for chest pain, palpitations and leg swelling.  Gastrointestinal: Negative for abdominal pain, diarrhea, nausea and vomiting.  Genitourinary: Negative for decreased urine volume,  difficulty urinating, dysuria, enuresis, frequency, hematuria, penile pain and urgency.  Musculoskeletal: Negative for arthralgias, gait problem and myalgias.  Skin: Negative for rash.  Neurological: Negative for dizziness, tremors, seizures, syncope, facial asymmetry, speech difficulty, weakness, light-headedness, numbness and headaches.  Hematological: Negative for adenopathy. Does not bruise/bleed easily.  Psychiatric/Behavioral: Negative for  behavioral problems, decreased concentration, dysphoric mood and suicidal ideas. The patient is not nervous/anxious and is not hyperactive.        He does not admit to feeling down, depressed, or hopeless.  But then asked again and he reports that he may occasionally feel sad.  He reports he does not feel anxious.  He reports that he does not drink alcohol to help him sleep.     Objective:   BP (!) 148/86 (BP Location: Left Arm, Patient Position: Sitting, Cuff Size: Normal)   Pulse 77   Temp 98.1 F (36.7 C) (Oral)   Resp 14   Ht 6\' 1"  (1.854 m)   Wt 141 lb 1.3 oz (64 kg)   SpO2 98%   BMI 18.61 kg/m   Physical Exam  Constitutional: Vital signs are normal.  Non-toxic appearance. No distress.  Thin elderly male, appearance older than stated age.  HENT:  Head: Normocephalic and atraumatic.  Mouth/Throat: Oropharynx is clear and moist. No oropharyngeal exudate.  Very poor dentition.  Eyes: Pupils are equal, round, and reactive to light. EOM are normal. No scleral icterus.  Muddy sclera, but no overt icterus.  Neck: Normal range of motion. Neck supple. No JVD present. No tracheal deviation present. No thyromegaly present.  Cardiovascular: Normal rate, regular rhythm and normal heart sounds.  Pulmonary/Chest: Effort normal and breath sounds normal.  Abdominal: Soft. Bowel sounds are normal. He exhibits no distension and no pulsatile liver. There is no hepatosplenomegaly. There is no tenderness.  Musculoskeletal: Normal range of motion.    Neurological: He is alert. He displays normal reflexes. No sensory deficit. He exhibits normal muscle tone.  Skin: Skin is warm and dry. Capillary refill takes less than 2 seconds.  Psychiatric: His speech is normal and behavior is normal. Judgment and thought content normal. He is not actively hallucinating. Cognition and memory are normal. He expresses no homicidal and no suicidal ideation. He expresses no suicidal plans and no homicidal plans.  Tearful during office visit when discussing alcohol use and needing to stop.  No suicidal or homicidal ideations.  Thought process is goal-directed without delusions, phobias, obsessions, or compulsions.  Judgment appears within normal limits.  Memory appears intact to immediate and recent, and remote recall.  No agitation or tremors.  No distracting behaviors or mannerisms.  Mood dysthymic slightly anxious.  Affect congruent with mood.   He is attentive.   Depression screen PHQ 2/9 11/23/2017  Decreased Interest 0  Down, Depressed, Hopeless 0  PHQ - 2 Score 0    Assessment and Plan  1. Alcohol abuse Patient with long-term history of alcohol abuse affecting his relationships and quality of life.  He understands the severity and life-threatening effects of his pattern of behavior.  He is interested in seeking help to become abstinent.  He is at significant risk for DTs and side effects of alcohol withdrawal.   Due to patient's willingness and his family support of this decision virtual behavioral health urgent referral was consulted today.  Patient spoke at length with Jazmine at behavioral health.  After  long discussion with behavioral health, patient left with a plan that he would be contacted after check with his insurance regarding behavioral health therapy. She has been called me subsequent to his departure again and she was considering [facility] treatment.  I discussed with her my concerns of alcohol withdrawal, maintenance of sobriety, and  complications for this patient.  We also discussed that he was willing to go  to an inpatient or an intensive day program because he has benefits and could take time off from work.  She reports that she will check this out with his insurance and be in contact with him. Office visit today was greater than 60 minutes.  Greater than 50% was spent counseling and coordinating care.  Due to his history of alcoholism will check B12 and go ahead and screen with UDS. - Vitamin B12 - Drugs of abuse screen w/o alc (for BH OP)  2. Screening for viral disease Check hep C today.  3. Fatigue, unspecified type Patient due for metabolic testing.  Suspect fatigue is most likely secondary to tobacco, alcohol, and mood disorder.  He is at risk for other metabolic conditions therefore order testing today. - CBC with Differential - COMPLETE METABOLIC PANEL WITH GFR - TSH - VITAMIN D 25 Hydroxy (Vit-D Deficiency, Fractures)  4. Need for Tdap vaccination Request records, and plan to give vaccinations at next visit.  5. HTN, goal below 140/90 Pressure not at goal today.  However, patient is anxious and this is been somewhat of a stressful, emotional visit for him.  We will plan to recheck at follow-up.  Dietary and salt reduction were as discussed with patient  6. Hyperlipidemia, unspecified hyperlipidemia type Plan to check lipids today.  He reports he has not been on medicine but has had a high cholesterol for quite some time. - Lipid panel  7. Screen for sexually transmitted diseases Lab testing ordered. - Hepatitis C antibody - Hepatitis panel, acute - HIV antibody - RPR  8. Hyperglycemia Noted history of hyperglycemia and history of alcohol use.  Check labs today. - Hemoglobin A1c  Commended to daughter and patient to get a multivitamin and start this on a daily basis.  Recommended to get B12 and thiamine.  We will follow-up and discuss at next visit.  Call with any questions, concerns, or worrisome  symptoms.  Records have also been requested. Return in about 2 weeks (around 12/07/2017) for Follow-up. Caren Macadam, MD 11/24/2017

## 2017-11-23 NOTE — Telephone Encounter (Signed)
12:30pm-12:35pm TC to [employer] and spoke with [employer]: [phone] Maudie Mercury reported services would be out of network with [facility] but was directed to finance department for more information.  Left message with finance department  1:00pm - 1:05 pm TC to Dr. Mannie Stabile - she recommends inpatient treatment due to concerns report during their visit.    1:25pm-1:30pm Received a call back from [place], she would need more information to determine how much the cost would be.  [facility] can provide inpatient services but not in the [facility]. Per Maudie Mercury, patient can be assessed at their location for services he may need and they would connect him to the appropriate level of care in their various locations in [place].  Plan:  This [facility] will contact patient & family regarding their options.

## 2017-11-24 ENCOUNTER — Telehealth: Payer: Self-pay | Admitting: Family Medicine

## 2017-11-24 DIAGNOSIS — Z113 Encounter for screening for infections with a predominantly sexual mode of transmission: Secondary | ICD-10-CM | POA: Insufficient documentation

## 2017-11-24 DIAGNOSIS — Z7141 Alcohol abuse counseling and surveillance of alcoholic: Secondary | ICD-10-CM | POA: Insufficient documentation

## 2017-11-24 DIAGNOSIS — F1010 Alcohol abuse, uncomplicated: Secondary | ICD-10-CM | POA: Insufficient documentation

## 2017-11-24 DIAGNOSIS — Z1159 Encounter for screening for other viral diseases: Secondary | ICD-10-CM | POA: Insufficient documentation

## 2017-11-24 DIAGNOSIS — E785 Hyperlipidemia, unspecified: Secondary | ICD-10-CM | POA: Insufficient documentation

## 2017-11-24 DIAGNOSIS — I10 Essential (primary) hypertension: Secondary | ICD-10-CM | POA: Insufficient documentation

## 2017-11-24 LAB — VITAMIN B12: Vitamin B-12: 350 pg/mL (ref 200–1100)

## 2017-11-24 LAB — HEMOGLOBIN A1C
EAG (MMOL/L): 6.5 (calc)
Hgb A1c MFr Bld: 5.7 % of total Hgb — ABNORMAL HIGH (ref <5.7)
Mean Plasma Glucose: 117 (calc)

## 2017-11-24 LAB — HIV ANTIBODY (ROUTINE TESTING W REFLEX): HIV: NONREACTIVE

## 2017-11-24 LAB — HEPATITIS PANEL, ACUTE
HEP B C IGM: NONREACTIVE
HEP B S AG: NONREACTIVE
Hep A IgM: NONREACTIVE
Hepatitis C Ab: NONREACTIVE
SIGNAL TO CUT-OFF: 0.03 (ref <1.00)

## 2017-11-24 LAB — RPR: RPR Ser Ql: NONREACTIVE

## 2017-11-24 LAB — VITAMIN D 25 HYDROXY (VIT D DEFICIENCY, FRACTURES): Vit D, 25-Hydroxy: 56 ng/mL (ref 30–100)

## 2017-11-24 NOTE — Telephone Encounter (Signed)
1:20-1:38 pm This VBH Specialist spoke with Scott Jackson, patient's daughter, and informed her about the options for detox and treatment.  Pt's daughter reported that she doesn't think patient will go into any facility for a few days, since he's concerned about missing work.  Pt's daughter reported that patient works half a day on Saturday, along with his regular hours Monday-Wednesday.  Pt's daughter reported that patient is concerned about missing work because he is financially supporting his daughter & granddaughter at this time.   Pt's daughter reported that patient is open to counseling and she thinks he needs it.  Plan: This VBH specialist will discuss options with Scott Jackson.   2:04pm -2:08 pm TC to Scott Jackson and informed him about his options for treatment and if he wants to completely stop using alcohol he will need to go to [facility] to be monitored by a doctor.  Scott Jackson was agreeable to the plan, he will talk to his sister about staying with his mother and he reported his job would ok if he missed a few days.  This VBH Specialist will contact his daughter again to discuss plans for Scott Jackson' treatment.   2:10 pm TC to [employer], pt's daughter, no answer. This VBH specialist left message to call back with name and contact information.

## 2017-11-24 NOTE — Telephone Encounter (Signed)
Scott Jackson, [place], left message regarding patient. She states he can go to [facility] for detox. She requests a call back from Dr. Mannie Stabile at [phone]

## 2017-11-24 NOTE — Telephone Encounter (Signed)
I spoke with [facility] on the phone.  She was questioning whether it would be acceptable for patient to have a scheduled admit for inpatient detox within the next several days versus going there urgently.  She reports she was concerned that he could possibly lose his job per his report and there were family concerns because he lives with his 67 year old mother.  I did discuss with Jasmine and she was going to talk with the family in more detail that he should not abruptly discontinue all alcohol use or he was at risk for alcohol withdrawal which could increase his risk of death and disability.  She will keep me contacted and up-to-date on any changes or issues.  She is going to talk with her family today to schedule his hospitalization for detox at [facility].

## 2017-11-24 NOTE — Telephone Encounter (Signed)
1:10pm This VBH Specialist spoke with Dr. Mannie Stabile about Scott Jackson options for treatment and when it can occur.  VBH Specialist discussed concerns with patient taking care of Scott 67 yo Jackson at home and Scott job, making sure there is a plan in place before patient goes in patient for detoxification and ongoing treatment.  Dr. Mannie Stabile agreed that there needs to be a plan in place and advised that patient does not stop alcohol use by himself due to high risk of seizures from withdrawal.  Plan: This VBH Specialist will contact patient & Scott daughter about a plan for patient's medically monitored detoxification.

## 2017-11-25 LAB — DRUGS OF ABUSE SCREEN W/O ALC, ROUTINE URINE
AMPHETAMINES (1000 ng/mL SCRN): NEGATIVE
BARBITURATES: NEGATIVE
BENZODIAZEPINES: NEGATIVE
COCAINE METABOLITES: NEGATIVE
MARIJUANA MET (50 ng/mL SCRN): NEGATIVE
METHADONE: NEGATIVE
METHAQUALONE: NEGATIVE
OPIATES: NEGATIVE
PHENCYCLIDINE: NEGATIVE
PROPOXYPHENE: NEGATIVE

## 2017-11-27 ENCOUNTER — Telehealth: Payer: Self-pay | Admitting: Clinical

## 2017-11-29 ENCOUNTER — Telehealth: Payer: Self-pay | Admitting: Clinical

## 2017-11-29 DIAGNOSIS — F1020 Alcohol dependence, uncomplicated: Secondary | ICD-10-CM

## 2017-11-29 NOTE — Progress Notes (Signed)
It is strongly recommended for inpatient detox given severity of symptoms. If the patient declines this option, will make referral to outpatient treatment for evaluation/treatment. He does not meet criteria for involuntary commitment given he is amenable to outpatient treatment and denies SI.

## 2017-11-29 NOTE — BH Specialist Note (Signed)
Virtual Behavioral Health Treatment Plan Team Note  MRN: [id number] NAME: Scott Jackson  DATE: 12/01/17  Total time: 10 min Total number of Virtual [place] Treatment Team Plan encounters: 1/4  Treatment Team Attendees: Sherilyn Dacosta & Dr. Modesta Messing  This [facility] specialists presented case to Dr. Modesta Messing.  Recommendation of inpatient detoxification and if the patient declines, she would recommend outpatient therapy for ongoing assessment and treatment.  Stress   Virtual [place] Visit from 11/23/2017 in [facility]  Current Stressors  Family death [Father's death many years ago]  Familial Stressors  None  Sleep  No problems  Appetite  Decreased  Coping ability  Other (Comment) [Increased alcohol use since father's death]  Patient taking medications as prescribed  Yes        Self-harm Behaviors Risk Assessment   Virtual [place] Visit from 11/23/2017 in [facility]  Self-harm risk factors  Substance use disorder [Increased alcohol use]  Have you recently had any thoughts about harming yourself?  No        Goals, Interventions and Follow-up Plan Goals: Decrease alcohol use Interventions: Functional Assessment of ADLs Link to [employer] Follow-up Plan:  This VBH Specialist will will follow up with pt and family regarding services.  Scribe for Treatment Team: Toney Rakes, [place]

## 2017-12-01 ENCOUNTER — Ambulatory Visit (INDEPENDENT_AMBULATORY_CARE_PROVIDER_SITE_OTHER)
Admission: RE | Admit: 2017-12-01 | Discharge: 2017-12-01 | Disposition: A | Discharge status: Home / Self Care | Payer: Managed Care, Other (non HMO) | Source: Ambulatory Visit | Attending: Acute Care | Admitting: Acute Care

## 2017-12-01 ENCOUNTER — Telehealth: Payer: Self-pay | Admitting: Acute Care

## 2017-12-01 ENCOUNTER — Ambulatory Visit (INDEPENDENT_AMBULATORY_CARE_PROVIDER_SITE_OTHER): Payer: Managed Care, Other (non HMO) | Admitting: Acute Care

## 2017-12-01 ENCOUNTER — Encounter: Payer: Self-pay | Admitting: Acute Care

## 2017-12-01 DIAGNOSIS — F17210 Nicotine dependence, cigarettes, uncomplicated: Secondary | ICD-10-CM | POA: Diagnosis not present

## 2017-12-01 DIAGNOSIS — Z122 Encounter for screening for malignant neoplasm of respiratory organs: Secondary | ICD-10-CM

## 2017-12-01 DIAGNOSIS — R69 Illness, unspecified: Secondary | ICD-10-CM | POA: Diagnosis not present

## 2017-12-01 NOTE — Telephone Encounter (Signed)
Spoke briefly with Alben Spittle, pt's daughter, she reported that they were at pt's doctor visit for his lungs.  Ms. Eliah Marquard reported she will call this [facility] specialist back.

## 2017-12-01 NOTE — Progress Notes (Signed)
Shared Decision Making Visit Lung Cancer Screening Program [phone])   Eligibility:  Age 67 y.o.  Pack Years Smoking History Calculation 75 pack year smoking history (# packs/per year x # years smoked)  Recent History of coughing up blood  no  Unexplained weight loss? no ( >Than 15 pounds within the last 6 months )  Prior History Lung / other cancer no (Diagnosis within the last 5 years already requiring surveillance chest CT Scans).  Smoking Status Current Smoker  Former Smokers: Years since quit: NA  Quit Date: NA  Visit Components:  Discussion included one or more decision making aids. yes  Discussion included risk/benefits of screening. yes  Discussion included potential follow up diagnostic testing for abnormal scans. yes  Discussion included meaning and risk of over diagnosis. yes  Discussion included meaning and risk of False Positives. yes  Discussion included meaning of total radiation exposure. yes  Counseling Included:  Importance of adherence to annual lung cancer LDCT screening. yes  Impact of comorbidities on ability to participate in the program. yes  Ability and willingness to under diagnostic treatment. yes  Smoking Cessation Counseling:  Current Smokers:   Discussed importance of smoking cessation. yes  Information about tobacco cessation classes and interventions provided to patient. yes  Patient provided with "ticket" for LDCT Scan. yes  Symptomatic Patient. no  Counseling  Diagnosis Code: Tobacco Use Z72.0  Asymptomatic Patient yes  Counseling (Intermediate counseling: > three minutes counseling) N4627  Former Smokers:   Discussed the importance of maintaining cigarette abstinence. yes  Diagnosis Code: Personal History of Nicotine Dependence. O35.009  Information about tobacco cessation classes and interventions provided to patient. Yes  Patient provided with "ticket" for LDCT Scan. yes  Written Order for Lung Cancer  Screening with LDCT placed in Epic. Yes (CT Chest Lung Cancer Screening Low Dose W/O CM) [id number] Z12.2-Screening of respiratory organs Z87.891-Personal history of nicotine dependence  I have spent 25 minutes of face to face time with Scott Jackson and Scott Jackson  discussing the risks and benefits of lung cancer screening. We viewed a power point together that explained in detail the above noted topics. We paused at intervals to allow for questions to be asked and answered to ensure understanding.We discussed that the single most powerful action that he can take to decrease Scott risk of developing lung cancer is to quit smoking. We discussed whether or not he is ready to commit to setting a quit date. He is not ready to set a quit date. We discussed options for tools to aid in quitting smoking including nicotine replacement therapy, non-nicotine medications, support groups, Quit Smart classes, and behavior modification. We discussed that often times setting smaller, more achievable goals, such as eliminating 1 cigarette a day for a week and then 2 cigarettes a day for a week can be helpful in slowly decreasing the number of cigarettes smoked. This allows for a sense of accomplishment as well as providing a clinical benefit. I gave him the " Be Stronger Than Your Excuses" card with contact information for community resources, classes, free nicotine replacement therapy, and access to mobile apps, text messaging, and on-line smoking cessation help. I have also given him my card and contact information in the event he needs to contact me. We discussed the time and location of the scan, and that either Doroteo Glassman RN or I will call with the results within 24-48 hours of receiving them. I have offered him  a copy of the power point  we viewed  as a resource in the event they need reinforcement of the concepts we discussed today in the office. The patient verbalized understanding of all of  the above and had no further  questions upon leaving the office. They have my contact information in the event they have any further questions.  I spent 4 minutes counseling on smoking cessation and the health risks of continued tobacco abuse.  I explained to the patient that there has been a high incidence of coronary artery disease noted on these exams. I explained that this is a non-gated exam therefore degree or severity cannot be determined. This patient is not on statin therapy. I have asked the patient to follow-up with their PCP regarding any incidental finding of coronary artery disease and management with diet or medication as their PCP  feels is clinically indicated. The patient verbalized understanding of the above and had no further questions upon completion of the visit.      Magdalen Spatz, NP 12/01/2017 3:55 PM

## 2017-12-01 NOTE — Telephone Encounter (Signed)
Denies, If you call Scott Jackson with his results he wants [place] to call his daughter, Scott Jackson>> [phone]. Thanks

## 2017-12-08 ENCOUNTER — Ambulatory Visit: Payer: Managed Care, Other (non HMO) | Admitting: Family Medicine

## 2017-12-08 ENCOUNTER — Telehealth: Payer: Self-pay | Admitting: Licensed Clinical Social Worker

## 2017-12-08 NOTE — Telephone Encounter (Signed)
Outreach call to patient; Left message with daughter [name]. She states that her father has told her that he doesn't want inpatient treatment. Per daughter, patient doesn't want to take days off of work. She agreed to give her father the message to contact [place].

## 2017-12-12 ENCOUNTER — Ambulatory Visit: Payer: Managed Care, Other (non HMO) | Admitting: Family Medicine

## 2017-12-13 ENCOUNTER — Other Ambulatory Visit: Payer: Self-pay | Admitting: Acute Care

## 2017-12-13 DIAGNOSIS — Z122 Encounter for screening for malignant neoplasm of respiratory organs: Secondary | ICD-10-CM

## 2017-12-13 DIAGNOSIS — F17210 Nicotine dependence, cigarettes, uncomplicated: Secondary | ICD-10-CM

## 2017-12-14 ENCOUNTER — Encounter: Payer: Self-pay | Admitting: Family Medicine

## 2017-12-14 ENCOUNTER — Ambulatory Visit (INDEPENDENT_AMBULATORY_CARE_PROVIDER_SITE_OTHER): Payer: Managed Care, Other (non HMO) | Admitting: Family Medicine

## 2017-12-14 ENCOUNTER — Other Ambulatory Visit: Payer: Self-pay

## 2017-12-14 ENCOUNTER — Telehealth: Payer: Self-pay | Admitting: Clinical

## 2017-12-14 VITALS — BP 128/70 | HR 73 | Temp 98.0°F | Resp 14 | Ht 73.0 in | Wt 142.0 lb

## 2017-12-14 DIAGNOSIS — I2510 Atherosclerotic heart disease of native coronary artery without angina pectoris: Secondary | ICD-10-CM

## 2017-12-14 DIAGNOSIS — I2584 Coronary atherosclerosis due to calcified coronary lesion: Secondary | ICD-10-CM

## 2017-12-14 DIAGNOSIS — F1010 Alcohol abuse, uncomplicated: Secondary | ICD-10-CM | POA: Diagnosis not present

## 2017-12-14 DIAGNOSIS — R69 Illness, unspecified: Secondary | ICD-10-CM | POA: Diagnosis not present

## 2017-12-14 DIAGNOSIS — I10 Essential (primary) hypertension: Secondary | ICD-10-CM

## 2017-12-14 NOTE — Telephone Encounter (Signed)
TC to [place], pt's daughter & Mr. Raju while they were in [facility] for Dr. Roma Kayser appointment.  This VBH Specialist discussed with them the previous options for treatment including inpatient and outpatient.  Mr. Turnage reported he didn't want to go anywhere inpatient but was open to setting up outpatient counseling.  [place] specialist gave them name & contact information of [facility] for outpatient therapy.  They reported they will follow up with [facility] directly.

## 2017-12-14 NOTE — Progress Notes (Signed)
Patient ID: Scott Jackson, male    DOB: October 31, 1950, 67 y.o.   MRN: [id number]  Chief Complaint  Patient presents with  . alcohol abuse counseling and surveillance of alcoholic    2 week follow up    Allergies Varenicline  Subjective:   Scott Jackson is a 67 y.o. male who presents to [facility] today.  HPI Patient presents today for a follow-up visit.  He is accompanied by his daughter for the visit.  Upon him arriving in the office we initiated virtual behavioral health consult due to the fact that there is been some question about patient's path for counseling/assistance related to his alcohol and mood disorder.  Patient and his daughter spoke with virtual behavioral health counselor.  Upon conclusion of the conversation with behavioral health specialist, I went into the room.  Patient and his daughter report that they have decided that he is not going to seek any inpatient counseling or detox services at this time.  He is not interested in doing inpatient services and his daughter reports that she cannot make him.  He prefers to try to attempt this on his own.  He has been cutting down o on his alcohol use for the past several weeks.  He tells me he understands that he cannot abruptly stop drinking alcohol or he could have side effects.  He reports he is motivated to get help and is considering some outpatient options on his days off.  He was given options for this by the behavioral health counselor.  He reports that I am not the first doctor that [place] told him that he has problems with alcohol and that he needs to quit drinking or it is going to kill him.  He reports he understands the told that this is taken on his health.  He comes back in today to get his blood pressure checked.  He reports that he has been taking his medications as directed.  He also comes in to go over his blood work.  He is trying to eat better and eat more nutritious foods.  His daughter reports that  she has not taken him to the liquor store since she was here last but she does not know what he does all the time.   They would also like to discuss the fact that he had a CT scan performed for lung cancer screening which revealed evidence of calcifications in his left anterior descending artery.  He has never had a heart attack or stroke.  He denies any chest pain or shortness of breath.  He feels like he can walk without any difficulty.  He has a history significant for alcohol and tobacco abuse.  He denies any chest pain or chest pain equivalent.  He does admit he can get slightly winded at times.  He would be interested in having a stress test and his daughter would like to proceed with this test/evaluation by cardiology.   Past Medical History:  Diagnosis Date  . Hypertension     Past Surgical History:  Procedure Laterality Date  . ANKLE FRACTURE SURGERY    . APPENDECTOMY    . intestininal blockage      Family History  Problem Relation Age of Onset  . Hypertension Mother   . Fibromyalgia Daughter   . Lupus Daughter   . Anxiety disorder Daughter   . Post-traumatic stress disorder Daughter   . Asthma Daughter   . Breast cancer Sister   .  Breast cancer Sister      Social History   Socioeconomic History  . Marital status: Married    Spouse name: Not on file  . Number of children: Not on file  . Years of education: Not on file  . Highest education level: Not on file  Occupational History  . Not on file  Social Needs  . Financial resource strain: Not on file  . Food insecurity:    Worry: Not on file    Inability: Not on file  . Transportation needs:    Medical: Not on file    Non-medical: Not on file  Tobacco Use  . Smoking status: Current Every Day Smoker    Packs/day: 1.50    Years: 50.00    Pack years: 75.00  . Smokeless tobacco: Never Used  Substance and Sexual Activity  . Alcohol use: Yes    Alcohol/week: 2.4 oz    Types: 4 Shots of liquor per week  .  Drug use: No  . Sexual activity: Yes  Lifestyle  . Physical activity:    Days per week: Not on file    Minutes per session: Not on file  . Stress: Not on file  Relationships  . Social connections:    Talks on phone: Not on file    Gets together: Not on file    Attends religious service: Not on file    Active member of club or organization: Not on file    Attends meetings of clubs or organizations: Not on file    Relationship status: Not on file  Other Topics Concern  . Not on file  Social History Narrative   Works at [employer] in [place], [place].   Separated.   [place], hunting.    Current Outpatient Medications on File Prior to Visit  Medication Sig Dispense Refill  . amLODipine (NORVASC) 5 MG tablet 5 mg.   0  . aspirin 81 MG chewable tablet Chew by mouth daily.    . Cholecalciferol (VITAMIN D3) 5000 units CAPS Take 1 capsule by mouth every other day.    . fluticasone (FLONASE) 50 MCG/ACT nasal spray Place 2 sprays into both nostrils daily. 16 g 2  . lisinopril (PRINIVIL,ZESTRIL) 20 MG tablet 20 mg.   0  . lisinopril-hydrochlorothiazide (PRINZIDE,ZESTORETIC) 10-12.5 MG tablet Take 1 tablet by mouth every morning.  1  . naproxen (NAPROSYN) 500 MG tablet Take 1 tablet (500 mg total) by mouth 2 (two) times daily with a meal. 60 tablet 5   No current facility-administered medications on file prior to visit.     Review of Systems   Objective:   BP 128/70 (BP Location: Left Arm, Patient Position: Sitting, Cuff Size: Normal)   Pulse 73   Temp 98 F (36.7 C) (Temporal)   Resp 14   Ht 6\' 1"  (1.854 m)   Wt 142 lb (64.4 kg)   SpO2 99%   BMI 18.73 kg/m   Physical Exam  Constitutional:  Thin elderly appearing male with poor dentition, appearance older than his stated age.  Well-dressed and well-groomed.  HENT:  Head: Normocephalic and atraumatic.  Eyes: Pupils are equal, round, and reactive to light. Conjunctivae are normal. No scleral icterus.  Neck: Normal range  of motion. Neck supple.  Cardiovascular: Normal rate and regular rhythm.  Pulmonary/Chest: Effort normal and breath sounds normal.  Abdominal: Soft. Bowel sounds are normal.  Skin: Skin is warm and dry.  Psychiatric: He has a normal mood and affect. His behavior is  normal. Judgment and thought content normal.   Depression screen [facility] 2/9 12/14/2017 11/23/2017  Decreased Interest 0 0  Down, Depressed, Hopeless 0 0  PHQ - 2 Score 0 0    Assessment and Plan   1. Coronary artery disease due to calcified coronary lesion Evidence of coronary artery atherosclerosis on low-dose CT scan for lung cancer.  We will proceed with cardiology evaluation at this time.  Due to alcohol use and history will await recommendation for aspirin pending cardiology evaluation secondary to risk of GI bleed. - Ambulatory referral to Cardiology  2. HTN, goal below 140/90 Continue lisinopril as directed. Cholesterol values reviewed and discussed with patient and his daughter.  LDL is less than 100 and HDL is greater than 90.  Alcohol abstinence and recommendations for outpatient therapy/assistance discussed with patient.  They have this information and will make their decision.  They do understand the risks of abrupt cessation of alcohol secondary to his long history of abuse.  They understand that alcohol withdrawal puts him at increased risk for seizures, death, and disability.  They voiced understanding.  He will follow-up in several months for recheck.  They will call with any questions, concerns, or assistance.  Smoking cessation was recommended.  Return in about 6 weeks (around 01/25/2018) for follow up. Caren Macadam, MD 12/15/2017

## 2017-12-15 ENCOUNTER — Encounter: Payer: Self-pay | Admitting: Family Medicine

## 2017-12-28 ENCOUNTER — Encounter: Payer: Self-pay | Admitting: Family Medicine

## 2017-12-29 ENCOUNTER — Encounter: Payer: Self-pay | Admitting: Family Medicine

## 2018-01-10 NOTE — Progress Notes (Signed)
Cardiology Office Note   Date:  01/11/2018   ID:  GERAN, HAITHCOCK 1951/01/04, MRN [id number]  PCP:  Caren Macadam, MD  Cardiologist:   Jenkins Rouge, MD   No chief complaint on file.     History of Present Illness: Scott Jackson is a 67 y.o. male who presents for consultation regarding CAD. Referred by Dr Mannie Stabile.  He is an alcoholic and has refused inpatient Rx. He had a lung cancer screening scan that noted calcium in his LAD. He has no cardiac history I reviewed scan from 12/03/17  And he had a small amount of calcium in the proximal to mid LAD and none in the RCA or circumflex. He has recently been more compliant with his BP meds. 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 He drinks vodka 3 shots ors so at night Does not drink during day Smokes about 1 ppd.   No chest pain claudication TIA or edema  Past Medical History:  Diagnosis Date  . Hypertension     Past Surgical History:  Procedure Laterality Date  . ANKLE FRACTURE SURGERY    . APPENDECTOMY    . intestininal blockage       Current Outpatient Medications  Medication Sig Dispense Refill  . amLODipine (NORVASC) 5 MG tablet 5 mg.   0  . aspirin 81 MG chewable tablet Chew by mouth daily.    Marland Kitchen lisinopril-hydrochlorothiazide (PRINZIDE,ZESTORETIC) 10-12.5 MG tablet Take 1 tablet by mouth every morning.  1   No current facility-administered medications for this visit.     Allergies:   Varenicline    Social History:  The patient  reports that he has been smoking.  He has a 75.00 pack-year smoking history. He has never used smokeless tobacco. He reports that he drinks about 2.4 oz of alcohol per week. He reports that he does not use drugs.    Family History:  The patient's family history includes Anxiety disorder in his daughter; Asthma in his daughter; Breast cancer in his sister and sister; Fibromyalgia in his daughter; Hypertension in his mother; Lupus in his daughter; Post-traumatic stress disorder in his daughter.    ROS:  Please see the history of present illness.   Otherwise, review of systems are positive for none.   All other systems are reviewed and negative.    PHYSICAL EXAM: VS:  BP (!) 144/84 (BP Location: Right Arm)   Pulse 75   Ht 6\' 1"  (1.854 m)   Wt 140 lb (63.5 kg)   SpO2 99%   BMI 18.47 kg/m  , BMI Body mass index is 18.47 kg/m. Affect appropriate Healthy:  appears stated age 54: normal Neck supple with no adenopathy JVP normal no bruits no thyromegaly Lungs clear with no wheezing and good diaphragmatic motion Heart:  S1/S2 no murmur, no rub, gallop or click PMI normal Abdomen: benighn, BS positve, no tenderness, no AAA no bruit.  No HSM or HJR Distal pulses intact with no bruits No edema Neuro non-focal Skin warm and dry No muscular weakness    EKG:  01/12/17 SR rate 78 normal ECG    Recent Labs: 11/23/2017: ALT 14; BUN 21; Creat 1.05; Hemoglobin  13.1; Platelets 416; Potassium 4.6; Sodium 137; TSH 1.15    Lipid Panel    Component Value Date/Time   CHOL 207 (H) 11/23/2017 1225   TRIG 139 11/23/2017 1225   HDL 97 11/23/2017 1225   CHOLHDL 2.1 11/23/2017 1225   LDLCALC 86 11/23/2017 1225      Wt Readings from Last 3 Encounters:  01/11/18 140 lb (63.5 kg)  12/14/17 142 lb (64.4 kg)  11/23/17 141 lb 1.3 oz (64 kg)      Other studies Reviewed: Additional studies/ records that were reviewed today include: Notes from primary Dr Glori Bickers, ECG labs chest CT .    ASSESSMENT AND PLAN:  1.  CAD:  Incidental finding on CT No symptoms CRF;s HTN and smoking Normal ECG f/u ETT 2. COPD: counseled on smoking cessation needs f/u CT scan 6 months for LLL lesion 3. ETOH:  Abuse refused  inpatient Rx working with daughter and primary on outpatient services 4. HTN:  Well controlled.  Continue current medications and low sodium Dash type diet.      Current medicines are reviewed at length with the patient today.  The patient does not have concerns regarding medicines.  The following changes have been made:  no change  Labs/ tests ordered today include: ETT  Orders Placed This Encounter  Procedures  . EKG 12-Lead     Disposition:   FU with cardiology PRN      Signed, Jenkins Rouge, MD  01/11/2018 9:13 AM    [facility] [address], [place], [place]  [address] Phone: [phone]; Fax: [phone]

## 2018-01-11 ENCOUNTER — Encounter: Payer: Self-pay | Admitting: Cardiovascular Disease

## 2018-01-11 ENCOUNTER — Ambulatory Visit (INDEPENDENT_AMBULATORY_CARE_PROVIDER_SITE_OTHER): Payer: Medicare HMO | Admitting: Cardiovascular Disease

## 2018-01-11 VITALS — BP 144/84 | HR 75 | Ht 73.0 in | Wt 140.0 lb

## 2018-01-11 DIAGNOSIS — I2510 Atherosclerotic heart disease of native coronary artery without angina pectoris: Secondary | ICD-10-CM | POA: Diagnosis not present

## 2018-01-11 NOTE — Patient Instructions (Signed)
Medication Instructions:  Your physician recommends that you continue on your current medications as directed. Please refer to the Current Medication list given to you today.   Labwork: none  Testing/Procedures: Your physician has requested that you have an exercise tolerance test. For further information please visit [email or web address]. Please also follow instruction sheet, as given.    Follow-Up: Your physician recommends that you schedule a follow-up appointment in: As needed   Any Other Special Instructions Will Be Listed Below (If Applicable).     If you need a refill on your cardiac medications before your next appointment, please call your pharmacy.

## 2018-01-16 ENCOUNTER — Telehealth: Payer: Self-pay

## 2018-01-16 NOTE — Telephone Encounter (Signed)
VBH Inactive - information routed to the PCPn and Dr. Modesta Messing.

## 2018-01-18 ENCOUNTER — Ambulatory Visit (HOSPITAL_COMMUNITY)
Admission: RE | Admit: 2018-01-18 | Discharge: 2018-01-18 | Disposition: A | Discharge status: Home / Self Care | Payer: Medicare HMO | Source: Ambulatory Visit | Attending: Cardiovascular Disease | Admitting: Cardiovascular Disease

## 2018-01-18 DIAGNOSIS — I2510 Atherosclerotic heart disease of native coronary artery without angina pectoris: Secondary | ICD-10-CM | POA: Diagnosis not present

## 2018-01-18 LAB — EXERCISE TOLERANCE TEST
CHL RATE OF PERCEIVED EXERTION: 13
CSEPED: 4 min
CSEPEDS: 31 s
CSEPEW: 7 METS
CSEPPHR: 148 {beats}/min
MPHR: 154 {beats}/min
Percent HR: 96 %
Rest HR: 86 {beats}/min

## 2018-02-01 ENCOUNTER — Other Ambulatory Visit: Payer: Self-pay

## 2018-02-01 ENCOUNTER — Encounter: Payer: Self-pay | Admitting: Family Medicine

## 2018-02-01 ENCOUNTER — Ambulatory Visit (INDEPENDENT_AMBULATORY_CARE_PROVIDER_SITE_OTHER): Payer: Medicare HMO | Admitting: Family Medicine

## 2018-02-01 VITALS — BP 124/86 | HR 72 | Temp 98.7°F | Resp 16 | Ht 73.0 in | Wt 141.1 lb

## 2018-02-01 DIAGNOSIS — I10 Essential (primary) hypertension: Secondary | ICD-10-CM

## 2018-02-01 MED ORDER — AMLODIPINE BESYLATE 5 MG PO TABS: 5.0000 mg | ORAL_TABLET | Freq: Every day | ORAL | 1 refills | Status: DC

## 2018-02-01 MED ORDER — LISINOPRIL-HYDROCHLOROTHIAZIDE 10-12.5 MG PO TABS: 1.0000 | ORAL_TABLET | ORAL | 1 refills | Status: DC

## 2018-02-01 NOTE — Progress Notes (Signed)
Patient ID: Scott Jackson, male    DOB: 03/19/1951, 67 y.o.   MRN: [id number]  Chief Complaint  Patient presents with  . Coronary Artery Disease    follow up    Allergies Patient has no active allergies.  Subjective:   Scott Jackson is a 67 y.o. male who presents to [facility] today.  HPI Mr. Stetzer presents today for follow-up of his blood pressure.  He reports that he has been doing well.  He is cut down on his tobacco and alcohol use.  He is still not interested in any inpatient or outpatient alcohol rehab facility.  He does not have any problems cutting down on his alcohol use.  He reports his appetite is good.  He is feeling good.  Mood is good.  Is enjoying his job.  Comes in today with his daughter and grandchild.  Has been taking all of his medications as directed.  Is also taking vitamin D.  Denies any side effects.  Has been seen by cardiology and had a stress test which was negative.  This was done because he had a CT scan of the lungs as screening for lung cancer which revealed some coronary atherosclerosis.  In addition, his previous PCP had recommended a stress test.  He reports he has not had any chest pain or shortness of breath.  Denies any swelling in his legs.  Would like to get a refill of his medications.  Is due to get a repeat CT scan of the lungs in November 2019.  In addition he will be due to for blood work at that time.  Was able to pull up his immunizations from his previous PCP and he has had both of his pneumonia shots which were given in 2016 and 2018.  In addition his tetanus shot was placed in 2016.   Past Medical History:  Diagnosis Date  . Hypertension     Past Surgical History:  Procedure Laterality Date  . ANKLE FRACTURE SURGERY    . APPENDECTOMY    . intestininal blockage      Family History  Problem Relation Age of Onset  . Hypertension Mother   . Fibromyalgia Daughter   . Lupus Daughter   . Anxiety disorder Daughter   .  Post-traumatic stress disorder Daughter   . Asthma Daughter   . Breast cancer Sister   . Breast cancer Sister      Social History   Socioeconomic History  . Marital status: Married    Spouse name: Not on file  . Number of children: Not on file  . Years of education: Not on file  . Highest education level: Not on file  Occupational History  . Not on file  Social Needs  . Financial resource strain: Not on file  . Food insecurity:    Worry: Not on file    Inability: Not on file  . Transportation needs:    Medical: Not on file    Non-medical: Not on file  Tobacco Use  . Smoking status: Current Every Day Smoker    Packs/day: 1.50    Years: 50.00    Pack years: 75.00  . Smokeless tobacco: Never Used  Substance and Sexual Activity  . Alcohol use: Yes    Alcohol/week: 2.4 oz    Types: 4 Shots of liquor per week    Comment: daily  . Drug use: No  . Sexual activity: Yes  Lifestyle  . Physical activity:  Days per week: Not on file    Minutes per session: Not on file  . Stress: Not on file  Relationships  . Social connections:    Talks on phone: Not on file    Gets together: Not on file    Attends religious service: Not on file    Active member of club or organization: Not on file    Attends meetings of clubs or organizations: Not on file    Relationship status: Not on file  Other Topics Concern  . Not on file  Social History Narrative   Works at [employer] in [place], [place].   Separated.   [place], hunting.    Current Outpatient Medications on File Prior to Visit  Medication Sig Dispense Refill  . aspirin 81 MG chewable tablet Chew by mouth daily.     No current facility-administered medications on file prior to visit.     Review of Systems  Constitutional: Negative for appetite change, chills, fever and unexpected weight change.  HENT: Negative for trouble swallowing and voice change.   Eyes: Negative for visual disturbance.  Respiratory: Negative for  cough, chest tightness, shortness of breath and wheezing.   Cardiovascular: Negative for chest pain, palpitations and leg swelling.  Gastrointestinal: Negative for abdominal pain, diarrhea, nausea and vomiting.  Genitourinary: Negative for decreased urine volume, dysuria and frequency.  Musculoskeletal: Negative for back pain and myalgias.  Skin: Negative for rash.  Neurological: Negative for dizziness, tremors, syncope, facial asymmetry, weakness, light-headedness, numbness and headaches.  Hematological: Negative for adenopathy. Does not bruise/bleed easily.  Psychiatric/Behavioral: Negative for dysphoric mood, sleep disturbance and suicidal ideas. The patient is not nervous/anxious.      Objective:   BP 124/86 (BP Location: Left Arm, Patient Position: Sitting, Cuff Size: Normal)   Pulse 72   Temp 98.7 F (37.1 C) (Temporal)   Resp 16   Ht 6\' 1"  (1.854 m)   Wt 141 lb 1.9 oz (64 kg)   SpO2 98%   BMI 18.62 kg/m   Physical Exam Pleasant, well-developed, well-nourished male in no acute distress.  Appearance consistent with his stated age.  Heart regular rate and rhythm.  Lungs clear to auscultation bilaterally.  No visualized lesions on skin.  Normocephalic atraumatic.  Pupils equally round and reactive to light.  Extraocular muscles intact.  No clubbing, cyanosis, or edema in extremities.  Mood euthymic.  Affect congruent with mood.  Thought process is goal-directed.  Thought content without delusions, phobias, obsessions, or compulsions.  No suicidal or homicidal ideations.    Assessment and Plan  1. HTN, goal below 140/90 Continue current medications as directed.  Refills given.  Patient will return to clinic/follow-up with new PCP office in November 2019 for check of labs and follow-up. - amLODipine (NORVASC) 5 MG tablet; Take 1 tablet (5 mg total) by mouth daily.  Dispense: 90 tablet; Refill: 1 - lisinopril-hydrochlorothiazide (PRINZIDE,ZESTORETIC) 10-12.5 MG tablet; Take 1 tablet  by mouth every morning.  Dispense: 90 tablet; Refill: 1  The 5 A's Model for treating Tobacco Use and Dependence was used today. I have identified and documented tobacco use status for this patient. I have urged the patient to quit tobacco use. At this time, the patient is unwilling and not ready to attempt to quit. I have provided patient with information regarding risks, cessation techniques, and interventions that might increase future attempts to quit smoking. I will plan on again addressing tobacco dependence at the next visit. Patient was counseled concerning the risks  of alcohol and tobacco abuse.  He is continuing to cut down and reports that he has not been drinking.  His daughter reports that this is true.  Recommend starting Centrum Silver vitamin 1 p.o. daily.  Recommend starting B12 1000 mcg p.o. daily.  Immunizations are up-to-date.  We will abstract those at this time. Return in about 4 months (around 06/04/2018) for follow up with new PCP office . Caren Macadam, MD 02/01/2018

## 2018-02-01 NOTE — Patient Instructions (Addendum)
Start: B12 1000 mcg a day Centrum Silver vitamin   Will need CT scan in 05/2108 : Lung-RADS 3, probably benign findings. Short-term follow-up in 6 months is recommended with repeat low-dose chest CT without contrast (please use the following order, "CT CHEST LCS NODULE FOLLOW-UP W/O CM").  Labs needed in November 2019.   Continue with Tobacco cessation

## 2018-04-05 DIAGNOSIS — Z8249 Family history of ischemic heart disease and other diseases of the circulatory system: Secondary | ICD-10-CM | POA: Diagnosis not present

## 2018-04-05 DIAGNOSIS — F17210 Nicotine dependence, cigarettes, uncomplicated: Secondary | ICD-10-CM | POA: Diagnosis not present

## 2018-04-05 DIAGNOSIS — Z7982 Long term (current) use of aspirin: Secondary | ICD-10-CM | POA: Diagnosis not present

## 2018-04-05 DIAGNOSIS — I10 Essential (primary) hypertension: Secondary | ICD-10-CM | POA: Diagnosis not present

## 2018-04-05 DIAGNOSIS — R69 Illness, unspecified: Secondary | ICD-10-CM | POA: Diagnosis not present

## 2018-06-14 ENCOUNTER — Ambulatory Visit (HOSPITAL_COMMUNITY)
Admission: RE | Admit: 2018-06-14 | Discharge: 2018-06-14 | Disposition: A | Discharge status: Home / Self Care | Payer: Medicare HMO | Source: Ambulatory Visit | Attending: Acute Care | Admitting: Acute Care

## 2018-06-14 DIAGNOSIS — Z122 Encounter for screening for malignant neoplasm of respiratory organs: Secondary | ICD-10-CM | POA: Diagnosis not present

## 2018-06-14 DIAGNOSIS — F17210 Nicotine dependence, cigarettes, uncomplicated: Secondary | ICD-10-CM

## 2018-06-14 DIAGNOSIS — J432 Centrilobular emphysema: Secondary | ICD-10-CM | POA: Insufficient documentation

## 2018-06-14 DIAGNOSIS — I700 Atherosclerosis of aorta: Secondary | ICD-10-CM | POA: Insufficient documentation

## 2018-06-14 DIAGNOSIS — Z87891 Personal history of nicotine dependence: Secondary | ICD-10-CM | POA: Diagnosis not present

## 2018-06-14 DIAGNOSIS — N62 Hypertrophy of breast: Secondary | ICD-10-CM | POA: Diagnosis not present

## 2018-06-14 DIAGNOSIS — R69 Illness, unspecified: Secondary | ICD-10-CM | POA: Diagnosis not present

## 2018-06-18 ENCOUNTER — Telehealth: Payer: Self-pay | Admitting: Acute Care

## 2018-06-18 DIAGNOSIS — Z87891 Personal history of nicotine dependence: Secondary | ICD-10-CM

## 2018-06-18 DIAGNOSIS — F17210 Nicotine dependence, cigarettes, uncomplicated: Secondary | ICD-10-CM

## 2018-06-18 DIAGNOSIS — Z122 Encounter for screening for malignant neoplasm of respiratory organs: Secondary | ICD-10-CM

## 2018-06-18 NOTE — Telephone Encounter (Signed)
Pt's daughter informed of CT results per Eric Form, NP.  Pt's duaghter verbalized understanding.  Copy sent to PCP.  Order placed for 1 yr f/u CT.

## 2018-07-06 DIAGNOSIS — L853 Xerosis cutis: Secondary | ICD-10-CM | POA: Diagnosis not present

## 2018-07-06 DIAGNOSIS — K649 Unspecified hemorrhoids: Secondary | ICD-10-CM | POA: Diagnosis not present

## 2018-07-06 DIAGNOSIS — I10 Essential (primary) hypertension: Secondary | ICD-10-CM | POA: Diagnosis not present

## 2018-07-06 DIAGNOSIS — R69 Illness, unspecified: Secondary | ICD-10-CM | POA: Diagnosis not present

## 2018-07-06 DIAGNOSIS — R7303 Prediabetes: Secondary | ICD-10-CM | POA: Diagnosis not present

## 2018-07-06 DIAGNOSIS — Z720 Tobacco use: Secondary | ICD-10-CM | POA: Diagnosis not present

## 2018-07-16 ENCOUNTER — Encounter: Payer: Self-pay | Admitting: Gastroenterology

## 2018-09-26 DIAGNOSIS — R69 Illness, unspecified: Secondary | ICD-10-CM | POA: Diagnosis not present

## 2018-09-26 DIAGNOSIS — M79606 Pain in leg, unspecified: Secondary | ICD-10-CM | POA: Diagnosis not present

## 2018-09-26 DIAGNOSIS — R1032 Left lower quadrant pain: Secondary | ICD-10-CM | POA: Diagnosis not present

## 2018-09-26 DIAGNOSIS — I10 Essential (primary) hypertension: Secondary | ICD-10-CM | POA: Diagnosis not present

## 2018-09-28 ENCOUNTER — Encounter: Payer: Self-pay | Admitting: Gastroenterology

## 2018-09-28 ENCOUNTER — Telehealth: Payer: Self-pay

## 2018-09-28 ENCOUNTER — Other Ambulatory Visit: Payer: Self-pay

## 2018-09-28 ENCOUNTER — Ambulatory Visit (INDEPENDENT_AMBULATORY_CARE_PROVIDER_SITE_OTHER): Payer: Medicare HMO | Admitting: Gastroenterology

## 2018-09-28 VITALS — BP 109/70 | HR 81 | Temp 97.0°F | Ht 73.0 in | Wt 143.4 lb

## 2018-09-28 DIAGNOSIS — K625 Hemorrhage of anus and rectum: Secondary | ICD-10-CM

## 2018-09-28 DIAGNOSIS — K5900 Constipation, unspecified: Secondary | ICD-10-CM | POA: Diagnosis not present

## 2018-09-28 DIAGNOSIS — K649 Unspecified hemorrhoids: Secondary | ICD-10-CM | POA: Diagnosis not present

## 2018-09-28 DIAGNOSIS — F1010 Alcohol abuse, uncomplicated: Secondary | ICD-10-CM | POA: Diagnosis not present

## 2018-09-28 DIAGNOSIS — R69 Illness, unspecified: Secondary | ICD-10-CM | POA: Diagnosis not present

## 2018-09-28 MED ORDER — POLYETHYLENE GLYCOL 3350 17 GM/SCOOP PO POWD: ORAL | 5 refills | Status: DC

## 2018-09-28 MED ORDER — PEG 3350-KCL-NA BICARB-NACL 420 G PO SOLR: 4000.0000 mL | ORAL | 0 refills | Status: DC

## 2018-09-28 NOTE — Assessment & Plan Note (Signed)
Very pleasant 68 year old gentleman presenting for further evaluation of intermittent rectal bleeding, constipation/straining, hemorrhoids.  He believes he has had colon polyps in the past, last colonoscopy several years ago in [place].  Given progression of symptoms, no current colonoscopy would recommend colonoscopy  to evaluate for possibility of underlying malignancy.  Suspect rectal symptoms related to hemorrhoids based on description but cannot exclude other etiologies without direct visualization.  Given his daily alcohol use, plan for deep sedation.  Patient is interested in hemorrhoid banding if he is deemed a candidate at time of colonoscopy.  He is not interested in pursuing surgery/hemorrhoidectomy however. Colonoscopy with possible hemorrhoid banding in near future.  I have discussed the risks, alternatives, benefits with regards to but not limited to the risk of reaction to medication, bleeding, infection, perforation and the patient is agreeable to proceed. Written consent to be obtained.

## 2018-09-28 NOTE — Assessment & Plan Note (Signed)
Having daily BMs, but stools hard and has to strain. Will start Miralax twice daily until soft stool, then continue once daily.

## 2018-09-28 NOTE — Telephone Encounter (Signed)
Called and informed Jasma of pt's pre-op appt 01/02/19 at 11:00am. Letter mailed.

## 2018-09-28 NOTE — Patient Instructions (Signed)
1. Start Miralax one capful twice daily until soft stool. Then continue one capful daily to maintain soft stool. Hold if diarrhea. RX sent to your pharmacy but some insurance companies do not cover because this is over the counter.  2. Colonoscopy with possible hemorrhoid banding as scheduled. See separate instructions.  3. We will obtain copy of last colonoscopy report for review.

## 2018-09-28 NOTE — Progress Notes (Signed)
Primary Care Physician:  Caren Macadam, MD  Primary Gastroenterologist:  Barney Drain, MD   Chief Complaint  Patient presents with  . Consult    TCS.  Marland Kitchen Hemorrhoids  . Constipation    has "hard" BM's mostly    HPI:  Scott Jackson is a 68 y.o. male here at the request of Dr. Mannie Stabile for further evaluation of hemorrhoids, need for colonoscopy.  Patient presents with his daughter [name].  He has had issues with hemorrhoids off and on for some time.  He downplays somewhat today but his daughter states that he complains about it frequently.  Sometimes rectal pain, sore, cannot sit down.  Has to go home from work and lay down to get relief.  He uses Preparation H, hemorrhoid pads with fairly good results.  He has a bowel movement most days but often hard stool, straining.  Symptoms have been worse of the past 1 to 2 years.  At times sees bright red blood on the toilet tissue.  Used to see above in the stool and in the toilet water but this has improved.  Recently given a prescription for meloxicam for groin pain.  Has not started yet.  Denies any significant upper GI symptoms such as heartburn, vomiting, dysphagia.  No abdominal pain.  He has a long history of alcohol abuse.  He drinks vodka daily.  His daughter states he was not an alcoholic until after his father was murdered in the 72s.  Patient has been able to maintain jobs.  He currently works several days per week.  He does not drive.  He is not interested in stopping alcohol use.    He reports he has had 2 colonoscopies in his life.  The last one was several years ago by Dr. Earley Brooke or Posey Pronto.  He believes he has had colon polyps.   Current Outpatient Medications  Medication Sig Dispense Refill  . amLODipine (NORVASC) 5 MG tablet Take 1 tablet (5 mg total) by mouth daily. 90 tablet 1  . aspirin 81 MG chewable tablet Chew by mouth daily.    Marland Kitchen lisinopril-hydrochlorothiazide (PRINZIDE,ZESTORETIC) 10-12.5 MG tablet Take 1 tablet by mouth every  morning. 90 tablet 1  . meloxicam (MOBIC) 7.5 MG tablet Take 7.5 mg by mouth daily.     No current facility-administered medications for this visit.     Allergies as of 09/28/2018  . (No Known Allergies)    Past Medical History:  Diagnosis Date  . ETOH abuse   . Hypertension   . Pulmonary nodule   . Tobacco abuse     Past Surgical History:  Procedure Laterality Date  . ANKLE FRACTURE SURGERY    . APPENDECTOMY    . intestininal blockage  1970    Family History  Problem Relation Age of Onset  . Hypertension Mother   . Fibromyalgia Daughter   . Lupus Daughter   . Anxiety disorder Daughter   . Post-traumatic stress disorder Daughter   . Asthma Daughter   . Breast cancer Sister   . Breast cancer Sister   . Colon cancer Neg Hx     Social History   Socioeconomic History  . Marital status: Married    Spouse name: Not on file  . Number of children: Not on file  . Years of education: Not on file  . Highest education level: Not on file  Occupational History  . Not on file  Social Needs  . Financial resource strain: Not on file  .  Food insecurity:    Worry: Not on file    Inability: Not on file  . Transportation needs:    Medical: Not on file    Non-medical: Not on file  Tobacco Use  . Smoking status: Current Every Day Smoker    Packs/day: 1.50    Years: 50.00    Pack years: 75.00  . Smokeless tobacco: Never Used  Substance and Sexual Activity  . Alcohol use: Yes    Alcohol/week: 4.0 standard drinks    Types: 4 Shots of liquor per week    Comment: daily  . Drug use: No  . Sexual activity: Yes  Lifestyle  . Physical activity:    Days per week: Not on file    Minutes per session: Not on file  . Stress: Not on file  Relationships  . Social connections:    Talks on phone: Not on file    Gets together: Not on file    Attends religious service: Not on file    Active member of club or organization: Not on file    Attends meetings of clubs or organizations:  Not on file    Relationship status: Not on file  . Intimate partner violence:    Fear of current or ex partner: Not on file    Emotionally abused: Not on file    Physically abused: Not on file    Forced sexual activity: Not on file  Other Topics Concern  . Not on file  Social History Narrative   Works at [employer] in [place], [place].   Separated.   [place], hunting.       ROS:  General: Negative for anorexia, weight loss, fever, chills, fatigue, weakness. Eyes: Negative for vision changes.  ENT: Negative for hoarseness, difficulty swallowing , nasal congestion. CV: Negative for chest pain, angina, palpitations, dyspnea on exertion, peripheral edema.  Respiratory: Negative for dyspnea at rest, dyspnea on exertion, cough, sputum, wheezing.  GI: See history of present illness. GU:  Negative for dysuria, hematuria, urinary incontinence, urinary frequency, nocturnal urination.  MS: Positive for joint pain.  No low back pain.  Derm: Negative for rash or itching.  Neuro: Negative for weakness, abnormal sensation, seizure, frequent headaches, memory loss, confusion.  Psych: Negative for anxiety, depression, suicidal ideation, hallucinations.  Endo: Negative for unusual weight change.  Heme: Negative for bruising or bleeding. Allergy: Negative for rash or hives.    Physical Examination:  BP 109/70   Pulse 81   Temp (!) 97 F (36.1 C) (Oral)   Ht 6\' 1"  (1.854 m)   Wt 143 lb 6.4 oz (65 kg)   BMI 18.92 kg/m    General: Well-nourished, well-developed in no acute distress.  Head: Normocephalic, atraumatic.   Eyes: Conjunctiva pink, no icterus. Mouth: Oropharyngeal mucosa moist and pink , no lesions erythema or exudate. Neck: Supple without thyromegaly, masses, or lymphadenopathy.  Lungs: Clear to auscultation bilaterally.  Heart: Regular rate and rhythm, no murmurs rubs or gallops.  Abdomen: Bowel sounds are normal, nontender, nondistended, no hepatosplenomegaly or masses,  no abdominal bruits or    hernia , no rebound or guarding.   Rectal: Deferred Extremities: No lower extremity edema. No clubbing or deformities.  Neuro: Alert and oriented x 4 , grossly normal neurologically.  Skin: Warm and dry, no rash or jaundice.   Psych: Alert and cooperative, normal mood and affect.  Labs: Labs December 2009, creatinine 0.79, albumin 4.3, total bilirubin 0.3, alkaline phosphatase 33, AST 45, ALT 16, A1c  4.7.  11/2017: acute hep panel negative.

## 2018-10-01 NOTE — Progress Notes (Signed)
CC'D TO PCP °

## 2018-10-16 ENCOUNTER — Encounter: Payer: Self-pay | Admitting: Gastroenterology

## 2018-11-20 DIAGNOSIS — I10 Essential (primary) hypertension: Secondary | ICD-10-CM | POA: Diagnosis not present

## 2018-11-20 DIAGNOSIS — R740 Nonspecific elevation of levels of transaminase and lactic acid dehydrogenase [LDH]: Secondary | ICD-10-CM | POA: Diagnosis not present

## 2018-11-20 DIAGNOSIS — G8929 Other chronic pain: Secondary | ICD-10-CM | POA: Diagnosis not present

## 2018-11-20 DIAGNOSIS — W19XXXA Unspecified fall, initial encounter: Secondary | ICD-10-CM | POA: Diagnosis not present

## 2018-11-20 DIAGNOSIS — M25562 Pain in left knee: Secondary | ICD-10-CM | POA: Diagnosis not present

## 2018-11-20 DIAGNOSIS — E538 Deficiency of other specified B group vitamins: Secondary | ICD-10-CM | POA: Diagnosis not present

## 2018-11-27 ENCOUNTER — Telehealth: Payer: Self-pay | Admitting: Radiology

## 2018-11-27 ENCOUNTER — Encounter: Payer: Self-pay | Admitting: Orthopaedic Surgery

## 2018-11-27 ENCOUNTER — Ambulatory Visit (INDEPENDENT_AMBULATORY_CARE_PROVIDER_SITE_OTHER): Payer: Medicare HMO

## 2018-11-27 ENCOUNTER — Ambulatory Visit: Payer: Medicare HMO | Admitting: Orthopaedic Surgery

## 2018-11-27 ENCOUNTER — Other Ambulatory Visit: Payer: Self-pay

## 2018-11-27 VITALS — BP 137/83 | HR 128 | Temp 97.4°F | Ht 73.0 in | Wt 140.0 lb

## 2018-11-27 DIAGNOSIS — R252 Cramp and spasm: Secondary | ICD-10-CM

## 2018-11-27 DIAGNOSIS — M25562 Pain in left knee: Secondary | ICD-10-CM | POA: Diagnosis not present

## 2018-11-27 MED ORDER — MELOXICAM 7.5 MG PO TABS: 7.5000 mg | ORAL_TABLET | Freq: Every day | ORAL | 2 refills | Status: DC

## 2018-11-27 NOTE — Progress Notes (Signed)
Patient Scott Jackson, male DOB:12-16-1950, 68 y.o. BHA:[id number]  Chief Complaint  Patient presents with  . Knee Problem    left knee gives out  . Leg Pain    lower legs painful / cramping wakes him up     HPI  Scott Jackson is a 68 y.o. male who has developed left knee pain with popping and giving way.  He has no trauma.  He has no redness.  It gives way more and more.  He has had some swelling at times.  He had problems with the right knee two years ago and is doing well with that.  He has night cramps of the calves and sometimes of the hands.  He has tried mustard and it works.  He has no trauma.  He has no weakness, no redness.   Body mass index is 18.47 kg/m.  ROS  Review of Systems  Constitutional: Positive for activity change.  Musculoskeletal: Positive for arthralgias, gait problem and joint swelling.  All other systems reviewed and are negative.  .  All other systems reviewed and are negative.  The following is a summary of the past history medically, past history surgically, known current medicines, social history and family history.  This information is gathered electronically by the computer from prior information and documentation.  I review this each visit and have found including this information at this point in the chart is beneficial and informative.    Past Medical History:  Diagnosis Date  . ETOH abuse   . Hypertension   . Pulmonary nodule   . Tobacco abuse     Past Surgical History:  Procedure Laterality Date  . ANKLE FRACTURE SURGERY    . APPENDECTOMY    . COLONOSCOPY  01/2015   Dr. Posey Pronto: two 2-47mm rectal polyps removed, hyperplastic  . COLONOSCOPY  2007   Dr. Posey Pronto: three adenomas removed measuring 4, 10, 49mm. diverticulosis  . intestininal blockage  1970    Family History  Problem Relation Age of Onset  . Hypertension Mother   . Stroke Mother   . Fibromyalgia Daughter   . Lupus Daughter   . Anxiety disorder Daughter   .  Post-traumatic stress disorder Daughter   . Asthma Daughter   . Breast cancer Sister   . Breast cancer Sister   . Colon cancer Neg Hx     Social History Social History   Tobacco Use  . Smoking status: Current Every Day Smoker    Packs/day: 1.50    Years: 50.00    Pack years: 75.00  . Smokeless tobacco: Never Used  Substance Use Topics  . Alcohol use: Yes    Alcohol/week: 4.0 standard drinks    Types: 4 Shots of liquor per week    Comment: daily  . Drug use: No    No Known Allergies  Current Outpatient Medications  Medication Sig Dispense Refill  . amLODipine (NORVASC) 10 MG tablet Take 10 mg by mouth daily.    Marland Kitchen aspirin 81 MG chewable tablet Chew by mouth daily.    Marland Kitchen lisinopril-hydrochlorothiazide (PRINZIDE,ZESTORETIC) 10-12.5 MG tablet Take 1 tablet by mouth every morning. 90 tablet 1  . meloxicam (MOBIC) 7.5 MG tablet Take 1 tablet (7.5 mg total) by mouth daily. 30 tablet 2  . polyethylene glycol powder (GLYCOLAX/MIRALAX) powder Take one capful twice per day until soft stool, then continue once daily to maintain soft stool. Hold if diarrhea. 527 g 5  . polyethylene glycol-electrolytes (TRILYTE) 420 g solution Take  4,000 mLs by mouth as directed. 4000 mL 0   No current facility-administered medications for this visit.      Physical Exam  Blood pressure 137/83, pulse (!) 128, temperature (!) 97.4 F (36.3 C), height 6\' 1"  (1.854 m), weight 140 lb (63.5 kg).  Constitutional: overall normal hygiene, normal nutrition, well developed, normal grooming, normal body habitus. Assistive device:none  Musculoskeletal: gait and station Limp left, muscle tone and strength are normal, no tremors or atrophy is present.  .  Neurological: coordination overall normal.  Deep tendon reflex/nerve stretch intact.  Sensation normal.  Cranial nerves II-XII intact.   Skin:   Normal overall no scars, lesions, ulcers or rashes. No psoriasis.  Psychiatric: Alert and oriented x 3.  Recent  memory intact, remote memory unclear.  Normal mood and affect. Well groomed.  Good eye contact.  Cardiovascular: overall no swelling, no varicosities, no edema bilaterally, normal temperatures of the legs and arms, no clubbing, cyanosis and good capillary refill.  Lymphatic: palpation is normal.  Left knee has ROM of 0 to 110, no effusion, crepitus, more medial pain, medial positive [place], [place] intact.  All other systems reviewed and are negative   The patient has been educated about the nature of the problem(s) and counseled on treatment options.  The patient appeared to understand what I have discussed and is in agreement with it.  I have reviewed Dr. Roma Kayser notes from 11-20-2018.  Encounter Diagnoses  Name Primary?  . Acute pain of left knee Yes  . Leg cramps     PLAN Call if any problems.  Precautions discussed.  Continue current medications. I will renew his Mobic he had been on from Dr. Mannie Stabile.    Return to clinic 1 month   I am concerned about a medial meniscus tear.  He may need MRI.  Electronically Signed Sanjuana Kava, MD 5/5/20208:41 AM

## 2018-11-27 NOTE — Telephone Encounter (Signed)
Left message for patient to call me back, Dr Luna Glasgow wants him to have MRI scan, it is scheduled for June 5th at 3pm, [facility], he needs to arrive at 2:30.

## 2018-11-27 NOTE — Addendum Note (Signed)
Addended byCandice Camp on: 11/27/2018 11:29 AM   Modules accepted: Orders

## 2018-12-11 ENCOUNTER — Telehealth: Payer: Self-pay | Admitting: *Deleted

## 2018-12-11 NOTE — Telephone Encounter (Signed)
Patient daughter [name] called. Patient lost his TCS instructions. She asked if these could be mailed to them. She ask that I also mail copy to her at [address] [place] [phone]. Instructions/pre-op mailed

## 2018-12-14 ENCOUNTER — Telehealth: Payer: Self-pay | Admitting: Orthopaedic Surgery

## 2018-12-14 NOTE — Telephone Encounter (Signed)
Melanie from [facility] called stating that the facility NPI # was incorrect.  She said the address is correct but that the NPI# should be [id number].  She asks that you get with [facility] and get this corrected.  Threasa Beards also asks that you call her at [phone]  Thanks

## 2018-12-18 NOTE — Telephone Encounter (Signed)
Updated facility with [facility]. Spoke to Miranda

## 2018-12-18 NOTE — Telephone Encounter (Signed)
Yes, it does, I will call them this afternoon.

## 2018-12-25 ENCOUNTER — Ambulatory Visit: Payer: Medicare HMO | Admitting: Orthopaedic Surgery

## 2018-12-28 ENCOUNTER — Ambulatory Visit (HOSPITAL_COMMUNITY): Admission: RE | Admit: 2018-12-28 | Payer: Medicare HMO | Source: Ambulatory Visit

## 2018-12-31 ENCOUNTER — Telehealth: Payer: Self-pay | Admitting: *Deleted

## 2018-12-31 DIAGNOSIS — R1013 Epigastric pain: Secondary | ICD-10-CM | POA: Diagnosis not present

## 2018-12-31 DIAGNOSIS — I10 Essential (primary) hypertension: Secondary | ICD-10-CM | POA: Diagnosis not present

## 2018-12-31 DIAGNOSIS — R69 Illness, unspecified: Secondary | ICD-10-CM | POA: Diagnosis not present

## 2018-12-31 NOTE — Telephone Encounter (Signed)
[facility] (Dr. Roma Kayser office) called. She is wanting patient to have an EGD added to patient TCS. She saw patient end of April and he has been having nausea and dyspepsia. She also feels he needs EGD d/t his h/o alcohol intake and smoking history. Dr. Mannie Stabile can be reached at [phone] (cell) if this needs to be discussed. LSL is off this week and so will forward to AB. Patient scheduled for TCS +/-HEM BAND with [facility] on 01/08/2019.

## 2018-12-31 NOTE — Patient Instructions (Signed)
Scott Jackson  12/31/2018     @PREFPERIOPPHARMACY @   Your procedure is scheduled on  01/08/2019.  Report to [facility] at  1100   A.M.  Call this number if you have problems the morning of surgery:  [phone]   Remember:  Follow the diet and prep instructions given to you by Dr Nona Dell office.                      Take these medicines the morning of surgery with A SIP OF WATER  Amlodipine, mobic.    Do not wear jewelry, make-up or nail polish.  Do not wear lotions, powders, or perfumes, or deodorant.  Do not shave 48 hours prior to surgery.  Men may shave face and neck.  Do not bring valuables to the hospital.  [facility] is not responsible for any belongings or valuables.  Contacts, dentures or bridgework may not be worn into surgery.  Leave your suitcase in the car.  After surgery it may be brought to your room.  For patients admitted to the hospital, discharge time will be determined by your treatment team.  Patients discharged the day of surgery will not be allowed to drive home.   Name and phone number of your driver:   family Special instructions:  None  Please read over the following fact sheets that you were given. Anesthesia Post-op Instructions and Care and Recovery After Surgery       Colonoscopy, Adult, Care After This sheet gives you information about how to care for yourself after your procedure. Your health care provider may also give you more specific instructions. If you have problems or questions, contact your health care provider. What can I expect after the procedure? After the procedure, it is common to have:  A small amount of blood in your stool for 24 hours after the procedure.  Some gas.  Mild abdominal cramping or bloating. Follow these instructions at home: General instructions  For the first 24 hours after the procedure: ? Do not drive or use machinery. ? Do not sign important documents. ? Do not drink alcohol. ? Do your  regular daily activities at a slower pace than normal. ? Eat soft, easy-to-digest foods.  Take over-the-counter or prescription medicines only as told by your health care provider. Relieving cramping and bloating   Try walking around when you have cramps or feel bloated.  Apply heat to your abdomen as told by your health care provider. Use a heat source that your health care provider recommends, such as a moist heat pack or a heating pad. ? Place a towel between your skin and the heat source. ? Leave the heat on for 20-30 minutes. ? Remove the heat if your skin turns bright red. This is especially important if you are unable to feel pain, heat, or cold. You may have a greater risk of getting burned. Eating and drinking   Drink enough fluid to keep your urine pale yellow.  Resume your normal diet as instructed by your health care provider. Avoid heavy or fried foods that are hard to digest.  Avoid drinking alcohol for as long as instructed by your health care provider. Contact a health care provider if:  You have blood in your stool 2-3 days after the procedure. Get help right away if:  You have more than a small spotting of blood in your stool.  You pass large blood clots in your  stool.  Your abdomen is swollen.  You have nausea or vomiting.  You have a fever.  You have increasing abdominal pain that is not relieved with medicine. Summary  After the procedure, it is common to have a small amount of blood in your stool. You may also have mild abdominal cramping and bloating.  For the first 24 hours after the procedure, do not drive or use machinery, sign important documents, or drink alcohol.  Contact your health care provider if you have a lot of blood in your stool, nausea or vomiting, a fever, or increased abdominal pain. This information is not intended to replace advice given to you by your health care provider. Make sure you discuss any questions you have with your  health care provider. Document Released: 02/23/2004 Document Revised: 05/03/2017 Document Reviewed: 09/22/2015 Elsevier Interactive Patient Education  2019 [place] Anesthesia, Adult, Care After This sheet gives you information about how to care for yourself after your procedure. Your health care provider may also give you more specific instructions. If you have problems or questions, contact your health care provider. What can I expect after the procedure? After the procedure, the following side effects are common:  Pain or discomfort at the IV site.  Nausea.  Vomiting.  Sore throat.  Trouble concentrating.  Feeling cold or chills.  Weak or tired.  Sleepiness and fatigue.  Soreness and body aches. These side effects can affect parts of the body that were not involved in surgery. Follow these instructions at home:  For at least 24 hours after the procedure:  Have a responsible adult stay with you. It is important to have someone help care for you until you are awake and alert.  Rest as needed.  Do not: ? Participate in activities in which you could fall or become injured. ? Drive. ? Use heavy machinery. ? Drink alcohol. ? Take sleeping pills or medicines that cause drowsiness. ? Make important decisions or sign legal documents. ? Take care of children on your own. Eating and drinking  Follow any instructions from your health care provider about eating or drinking restrictions.  When you feel hungry, start by eating small amounts of foods that are soft and easy to digest (bland), such as toast. Gradually return to your regular diet.  Drink enough fluid to keep your urine pale yellow.  If you vomit, rehydrate by drinking water, juice, or clear broth. General instructions  If you have sleep apnea, surgery and certain medicines can increase your risk for breathing problems. Follow instructions from your health care provider about wearing your sleep  device: ? Anytime you are sleeping, including during daytime naps. ? While taking prescription pain medicines, sleeping medicines, or medicines that make you drowsy.  Return to your normal activities as told by your health care provider. Ask your health care provider what activities are safe for you.  Take over-the-counter and prescription medicines only as told by your health care provider.  If you smoke, do not smoke without supervision.  Keep all follow-up visits as told by your health care provider. This is important. Contact a health care provider if:  You have nausea or vomiting that does not get better with medicine.  You cannot eat or drink without vomiting.  You have pain that does not get better with medicine.  You are unable to pass urine.  You develop a skin rash.  You have a fever.  You have redness around your IV site that gets worse.  Get help right away if:  You have difficulty breathing.  You have chest pain.  You have blood in your urine or stool, or you vomit blood. Summary  After the procedure, it is common to have a sore throat or nausea. It is also common to feel tired.  Have a responsible adult stay with you for the first 24 hours after general anesthesia. It is important to have someone help care for you until you are awake and alert.  When you feel hungry, start by eating small amounts of foods that are soft and easy to digest (bland), such as toast. Gradually return to your regular diet.  Drink enough fluid to keep your urine pale yellow.  Return to your normal activities as told by your health care provider. Ask your health care provider what activities are safe for you. This information is not intended to replace advice given to you by your health care provider. Make sure you discuss any questions you have with your health care provider. Document Released: 10/17/2000 Document Revised: 02/24/2017 Document Reviewed: 02/24/2017 Elsevier  Interactive Patient Education  2019 [employer].

## 2019-01-01 ENCOUNTER — Telehealth: Payer: Self-pay | Admitting: Gastroenterology

## 2019-01-01 NOTE — Telephone Encounter (Signed)
Patient daughter called and said dr hagler put in an order for her father to have an endo also during his procedure.  Said because "He has had stomach problems since I was a child."  I told her that I would let the nurses know and she said that Dr. Mannie Stabile wanted her to call and let us know there was an order in

## 2019-01-01 NOTE — Telephone Encounter (Signed)
Yes there is room. Fowarding to susan for request

## 2019-01-01 NOTE — Telephone Encounter (Signed)
See previous note. AB requested Dr. Marlana Latus last [place]. Manuela Schwartz requested this

## 2019-01-01 NOTE — Telephone Encounter (Signed)
Requested records from [facility] at Dr [facility] office

## 2019-01-01 NOTE — Telephone Encounter (Signed)
Please request that office visit note so we can review and document this. Also, is there room to add an EGD on that day?

## 2019-01-02 ENCOUNTER — Other Ambulatory Visit: Payer: Self-pay

## 2019-01-02 ENCOUNTER — Encounter (HOSPITAL_COMMUNITY)
Admission: RE | Admit: 2019-01-02 | Discharge: 2019-01-02 | Disposition: A | Discharge status: Home / Self Care | Payer: Medicare HMO | Source: Ambulatory Visit | Attending: Gastroenterology | Admitting: Gastroenterology

## 2019-01-02 ENCOUNTER — Encounter (HOSPITAL_COMMUNITY): Payer: Self-pay

## 2019-01-02 DIAGNOSIS — R110 Nausea: Secondary | ICD-10-CM | POA: Insufficient documentation

## 2019-01-02 DIAGNOSIS — Z1159 Encounter for screening for other viral diseases: Secondary | ICD-10-CM | POA: Insufficient documentation

## 2019-01-02 DIAGNOSIS — K625 Hemorrhage of anus and rectum: Secondary | ICD-10-CM | POA: Insufficient documentation

## 2019-01-02 DIAGNOSIS — K649 Unspecified hemorrhoids: Secondary | ICD-10-CM | POA: Diagnosis not present

## 2019-01-02 DIAGNOSIS — Z01812 Encounter for preprocedural laboratory examination: Secondary | ICD-10-CM | POA: Diagnosis not present

## 2019-01-02 DIAGNOSIS — R1013 Epigastric pain: Secondary | ICD-10-CM | POA: Diagnosis not present

## 2019-01-02 LAB — BASIC METABOLIC PANEL
Anion gap: 20 — ABNORMAL HIGH (ref 5–15)
BUN: 9 mg/dL (ref 8–23)
CO2: 22 mmol/L (ref 22–32)
Calcium: 9.8 mg/dL (ref 8.9–10.3)
Chloride: 99 mmol/L (ref 98–111)
Creatinine, Ser: 0.78 mg/dL (ref 0.61–1.24)
GFR calc Af Amer: >60 mL/min (ref >60)
GFR calc non Af Amer: >60 mL/min (ref >60)
Glucose, Bld: 109 mg/dL — ABNORMAL HIGH (ref 70–99)
Potassium: 3.4 mmol/L — ABNORMAL LOW (ref 3.5–5.1)
Sodium: 141 mmol/L (ref 135–145)

## 2019-01-02 LAB — CBC WITH DIFFERENTIAL/PLATELET
Abs Immature Granulocytes: 0 10*3/uL (ref 0.00–0.07)
Basophils Absolute: 0 10*3/uL (ref 0.0–0.1)
Basophils Relative: 0 %
Eosinophils Absolute: 0 10*3/uL (ref 0.0–0.5)
Eosinophils Relative: 0 %
HCT: 33.6 % — ABNORMAL LOW (ref 39.0–52.0)
Hemoglobin: 11.4 g/dL — ABNORMAL LOW (ref 13.0–17.0)
Immature Granulocytes: 0 %
Lymphocytes Relative: 56 %
Lymphs Abs: 1.9 10*3/uL (ref 0.7–4.0)
MCH: 32.6 pg (ref 26.0–34.0)
MCHC: 33.9 g/dL (ref 30.0–36.0)
MCV: 96 fL (ref 80.0–100.0)
Monocytes Absolute: 0.4 10*3/uL (ref 0.1–1.0)
Monocytes Relative: 12 %
Neutro Abs: 1.1 10*3/uL — ABNORMAL LOW (ref 1.7–7.7)
Neutrophils Relative %: 32 %
Platelets: 170 10*3/uL (ref 150–400)
RBC: 3.5 MIL/uL — ABNORMAL LOW (ref 4.22–5.81)
RDW: 17.5 % — ABNORMAL HIGH (ref 11.5–15.5)
WBC: 3.3 10*3/uL — ABNORMAL LOW (ref 4.0–10.5)
nRBC: 0.6 % — ABNORMAL HIGH (ref 0.0–0.2)

## 2019-01-04 ENCOUNTER — Other Ambulatory Visit: Payer: Self-pay

## 2019-01-04 ENCOUNTER — Other Ambulatory Visit (HOSPITAL_COMMUNITY)
Admission: RE | Admit: 2019-01-04 | Discharge: 2019-01-04 | Disposition: A | Discharge status: Home / Self Care | Payer: Medicare HMO | Source: Ambulatory Visit | Attending: Gastroenterology | Admitting: Gastroenterology

## 2019-01-04 DIAGNOSIS — Z01812 Encounter for preprocedural laboratory examination: Secondary | ICD-10-CM | POA: Diagnosis not present

## 2019-01-04 DIAGNOSIS — R1013 Epigastric pain: Secondary | ICD-10-CM | POA: Diagnosis not present

## 2019-01-04 DIAGNOSIS — K649 Unspecified hemorrhoids: Secondary | ICD-10-CM | POA: Diagnosis not present

## 2019-01-04 DIAGNOSIS — K625 Hemorrhage of anus and rectum: Secondary | ICD-10-CM | POA: Diagnosis not present

## 2019-01-04 DIAGNOSIS — R110 Nausea: Secondary | ICD-10-CM | POA: Diagnosis not present

## 2019-01-04 DIAGNOSIS — Z1159 Encounter for screening for other viral diseases: Secondary | ICD-10-CM | POA: Diagnosis not present

## 2019-01-05 LAB — NOVEL CORONAVIRUS, NAA (HOSP ORDER, SEND-OUT TO REF LAB; TAT 18-24 HRS): SARS-CoV-2, NAA: NOT DETECTED

## 2019-01-07 ENCOUNTER — Encounter (HOSPITAL_COMMUNITY): Payer: Self-pay | Admitting: Anesthesiology

## 2019-01-07 ENCOUNTER — Other Ambulatory Visit: Payer: Self-pay

## 2019-01-07 DIAGNOSIS — R110 Nausea: Secondary | ICD-10-CM

## 2019-01-07 DIAGNOSIS — K649 Unspecified hemorrhoids: Secondary | ICD-10-CM

## 2019-01-07 DIAGNOSIS — R1013 Epigastric pain: Secondary | ICD-10-CM

## 2019-01-07 DIAGNOSIS — K625 Hemorrhage of anus and rectum: Secondary | ICD-10-CM

## 2019-01-07 NOTE — Telephone Encounter (Signed)
I have still not received notes. I attempted to contact patient. Daughter, [name], spoke with me. Patient not available. States patient has had vague epigastric/right-sided discomfort, nausea, dealing with anxiety. Stopped Meloxicam. Intermittent nausea. Gallbladder present. History of alcohol abuse. Dr. Mannie Stabile requesting EGD at time of colonoscopy. These symptoms not present at time of visit.   Please add possible EGD with colonoscopy. I have asked him to call back so I can review risks and benefits. I reviewed this with daughter as well.

## 2019-01-07 NOTE — Telephone Encounter (Signed)
Endo scheduler cancelled previous orders. New orders placed for TCS/-/+EGD/-/+hem banding w/Propofol w/SLF 01/08/19.

## 2019-01-08 ENCOUNTER — Ambulatory Visit (HOSPITAL_COMMUNITY): Payer: Medicare HMO | Admitting: Anesthesiology

## 2019-01-08 ENCOUNTER — Ambulatory Visit (HOSPITAL_COMMUNITY)
Admission: RE | Admit: 2019-01-08 | Discharge: 2019-01-08 | Disposition: A | Discharge status: Home / Self Care | Payer: Medicare HMO | Attending: Gastroenterology | Admitting: Gastroenterology

## 2019-01-08 ENCOUNTER — Encounter (HOSPITAL_COMMUNITY)
Admission: RE | Disposition: A | Discharge status: Home / Self Care | Payer: Self-pay | Source: Home / Self Care | Attending: Gastroenterology

## 2019-01-08 ENCOUNTER — Ambulatory Visit (HOSPITAL_COMMUNITY): Admission: RE | Admit: 2019-01-08 | Payer: Medicare HMO | Source: Home / Self Care | Admitting: Gastroenterology

## 2019-01-08 ENCOUNTER — Encounter (HOSPITAL_COMMUNITY): Payer: Self-pay

## 2019-01-08 ENCOUNTER — Other Ambulatory Visit: Payer: Self-pay

## 2019-01-08 ENCOUNTER — Encounter (HOSPITAL_COMMUNITY): Admission: RE | Payer: Self-pay | Source: Home / Self Care

## 2019-01-08 DIAGNOSIS — Z825 Family history of asthma and other chronic lower respiratory diseases: Secondary | ICD-10-CM | POA: Diagnosis not present

## 2019-01-08 DIAGNOSIS — K222 Esophageal obstruction: Secondary | ICD-10-CM | POA: Diagnosis not present

## 2019-01-08 DIAGNOSIS — K648 Other hemorrhoids: Secondary | ICD-10-CM | POA: Diagnosis not present

## 2019-01-08 DIAGNOSIS — K3189 Other diseases of stomach and duodenum: Secondary | ICD-10-CM | POA: Diagnosis not present

## 2019-01-08 DIAGNOSIS — K2960 Other gastritis without bleeding: Secondary | ICD-10-CM | POA: Diagnosis not present

## 2019-01-08 DIAGNOSIS — Z8489 Family history of other specified conditions: Secondary | ICD-10-CM | POA: Diagnosis not present

## 2019-01-08 DIAGNOSIS — D649 Anemia, unspecified: Secondary | ICD-10-CM | POA: Diagnosis not present

## 2019-01-08 DIAGNOSIS — J449 Chronic obstructive pulmonary disease, unspecified: Secondary | ICD-10-CM | POA: Insufficient documentation

## 2019-01-08 DIAGNOSIS — K625 Hemorrhage of anus and rectum: Secondary | ICD-10-CM

## 2019-01-08 DIAGNOSIS — Z79899 Other long term (current) drug therapy: Secondary | ICD-10-CM | POA: Insufficient documentation

## 2019-01-08 DIAGNOSIS — K5790 Diverticulosis of intestine, part unspecified, without perforation or abscess without bleeding: Secondary | ICD-10-CM | POA: Diagnosis not present

## 2019-01-08 DIAGNOSIS — K2980 Duodenitis without bleeding: Secondary | ICD-10-CM | POA: Diagnosis not present

## 2019-01-08 DIAGNOSIS — R911 Solitary pulmonary nodule: Secondary | ICD-10-CM | POA: Insufficient documentation

## 2019-01-08 DIAGNOSIS — I10 Essential (primary) hypertension: Secondary | ICD-10-CM | POA: Diagnosis not present

## 2019-01-08 DIAGNOSIS — R110 Nausea: Secondary | ICD-10-CM

## 2019-01-08 DIAGNOSIS — F17200 Nicotine dependence, unspecified, uncomplicated: Secondary | ICD-10-CM | POA: Insufficient documentation

## 2019-01-08 DIAGNOSIS — T39395A Adverse effect of other nonsteroidal anti-inflammatory drugs [NSAID], initial encounter: Secondary | ICD-10-CM | POA: Diagnosis not present

## 2019-01-08 DIAGNOSIS — K921 Melena: Secondary | ICD-10-CM | POA: Diagnosis not present

## 2019-01-08 DIAGNOSIS — Z818 Family history of other mental and behavioral disorders: Secondary | ICD-10-CM | POA: Diagnosis not present

## 2019-01-08 DIAGNOSIS — Z823 Family history of stroke: Secondary | ICD-10-CM | POA: Insufficient documentation

## 2019-01-08 DIAGNOSIS — Z791 Long term (current) use of non-steroidal anti-inflammatories (NSAID): Secondary | ICD-10-CM | POA: Insufficient documentation

## 2019-01-08 DIAGNOSIS — K649 Unspecified hemorrhoids: Secondary | ICD-10-CM

## 2019-01-08 DIAGNOSIS — Q438 Other specified congenital malformations of intestine: Secondary | ICD-10-CM

## 2019-01-08 DIAGNOSIS — K449 Diaphragmatic hernia without obstruction or gangrene: Secondary | ICD-10-CM | POA: Diagnosis not present

## 2019-01-08 DIAGNOSIS — Z8249 Family history of ischemic heart disease and other diseases of the circulatory system: Secondary | ICD-10-CM | POA: Insufficient documentation

## 2019-01-08 DIAGNOSIS — Z803 Family history of malignant neoplasm of breast: Secondary | ICD-10-CM | POA: Diagnosis not present

## 2019-01-08 DIAGNOSIS — Z7982 Long term (current) use of aspirin: Secondary | ICD-10-CM | POA: Diagnosis not present

## 2019-01-08 DIAGNOSIS — K5730 Diverticulosis of large intestine without perforation or abscess without bleeding: Secondary | ICD-10-CM | POA: Diagnosis not present

## 2019-01-08 DIAGNOSIS — K319 Disease of stomach and duodenum, unspecified: Secondary | ICD-10-CM | POA: Insufficient documentation

## 2019-01-08 DIAGNOSIS — R1013 Epigastric pain: Secondary | ICD-10-CM

## 2019-01-08 HISTORY — PX: ESOPHAGOGASTRODUODENOSCOPY (EGD) WITH PROPOFOL: SHX5813

## 2019-01-08 HISTORY — PX: COLONOSCOPY WITH PROPOFOL: SHX5780

## 2019-01-08 HISTORY — PX: BIOPSY: SHX5522

## 2019-01-08 SURGERY — COLONOSCOPY WITH PROPOFOL
Anesthesia: General

## 2019-01-08 SURGERY — COLONOSCOPY WITH PROPOFOL
Anesthesia: Monitor Anesthesia Care

## 2019-01-08 MED ORDER — KETAMINE HCL 10 MG/ML IJ SOLN
INTRAMUSCULAR | Status: DC | PRN
  Administered 2019-01-08 (×2): 10 mg via INTRAVENOUS

## 2019-01-08 MED ORDER — PROPOFOL 500 MG/50ML IV EMUL
INTRAVENOUS | Status: DC | PRN
  Administered 2019-01-08: 150 ug/kg/min via INTRAVENOUS
  Administered 2019-01-08 (×2): via INTRAVENOUS

## 2019-01-08 MED ORDER — HYDROCODONE-ACETAMINOPHEN 7.5-325 MG PO TABS: 1.0000 | ORAL_TABLET | Freq: Once | ORAL | Status: DC | PRN

## 2019-01-08 MED ORDER — OMEPRAZOLE 20 MG PO CPDR: DELAYED_RELEASE_CAPSULE | ORAL | 3 refills | Status: DC

## 2019-01-08 MED ORDER — CHLORHEXIDINE GLUCONATE CLOTH 2 % EX PADS: 6.0000 | MEDICATED_PAD | Freq: Once | CUTANEOUS | Status: DC

## 2019-01-08 MED ORDER — LACTATED RINGERS IV SOLN
INTRAVENOUS | Status: DC
  Administered 2019-01-08: 12:00:00 via INTRAVENOUS

## 2019-01-08 MED ORDER — MIDAZOLAM HCL 2 MG/2ML IJ SOLN: 0.5000 mg | Freq: Once | INTRAMUSCULAR | Status: DC | PRN

## 2019-01-08 MED ORDER — PROPOFOL 10 MG/ML IV BOLUS
INTRAVENOUS | Status: DC | PRN
  Administered 2019-01-08 (×3): 20 mg via INTRAVENOUS

## 2019-01-08 MED ORDER — PROMETHAZINE HCL 25 MG/ML IJ SOLN: 6.2500 mg | INTRAMUSCULAR | Status: DC | PRN

## 2019-01-08 MED ORDER — HYDROMORPHONE HCL 1 MG/ML IJ SOLN: 0.2500 mg | INTRAMUSCULAR | Status: DC | PRN

## 2019-01-08 NOTE — Op Note (Signed)
[facility] Patient Name: Scott Jackson Procedure Date: 01/08/2019 11:52 AM MRN: [id number] Date of Birth: 07-03-1951 Attending MD: Barney Drain MD, MD CSN: [id number] Age: 68 Admit Type: Outpatient Procedure:                Colonoscopy ABORTED AND SIGMOIDOSCOPY COMPLETE Indications:              Hematochezia Providers:                Barney Drain MD, MD, Otis Peak B. Sharon Seller, RN,                            Randa Spike, Technician Referring MD:             Caren Macadam Medicines:                Propofol per Anesthesia Complications:            No immediate complications. Estimated Blood Loss:     Estimated blood loss was minimal. Procedure:                Pre-Anesthesia Assessment:                           - Prior to the procedure, a History and Physical                            was performed, and patient medications and                            allergies were reviewed. The patient's tolerance of                            previous anesthesia was also reviewed. The risks                            and benefits of the procedure and the sedation                            options and risks were discussed with the patient.                            All questions were answered, and informed consent                            was obtained. Prior Anticoagulants: The patient                            Mobic. ASA Grade Assessment: II - A patient with                            mild systemic disease. After reviewing the risks                            and benefits, the patient was deemed in  satisfactory condition to undergo the procedure.                            After obtaining informed consent, the colonoscope                            was passed under direct vision. Throughout the                            procedure, the patient's blood pressure, pulse, and                            oxygen saturations were monitored continuously. The                  ADULT COLONOSCOPE, PEDIATRIC COLONOSCOPE. AND TEH                            ULTRASLIM CF-HQ190L ([id number]) scope was introduced                            through the anus with the intention of advancing to                            the CECUM. The scope was advanced to the sigmoid                            colon before the procedure was aborted DUE TO                            INABILITY TO ADVANCE SCOPE TO THE SPLENIC FLEXURE.                            Medications were given. The colonoscopy was                            extremely difficult due to restricted mobility of                            the colon. Successful completion of the                            SIGMOIDOSCOPY was aided by changing the patient to                            a supine position, using manual pressure, changing                            endoscopes, straightening and shortening the scope                            to obtain bowel loop reduction and COLOWRAP. The  patient tolerated the procedure well. The quality                            of the bowel preparation was good. The rectum was                            photographed. Scope In: 12:48:27 PM Scope Out: 12:59:15 PM Total Procedure Duration: 0 hours 10 minutes 48 seconds  Findings:      The recto-sigmoid colon was grossly tortuous.      Multiple small and large-mouthed diverticula were found in the       recto-sigmoid colon and sigmoid colon.      Internal hemorrhoids were found. The hemorrhoids were moderate. Impression:               - SEVERE Diverticulosis WITH FIXED RECTOSIGMOID                            REGION                           - RECTAL BLEEDING MOST LIKELY DUE TO Internal                            hemorrhoids. Moderate Sedation:      Per Anesthesia Care Recommendation:           - Patient has a contact number available for                            emergencies. The signs and symptoms of  potential                            delayed complications were discussed with the                            patient. Return to normal activities tomorrow.                            Written discharge instructions were provided to the                            patient.                           - High fiber diet.                           - Continue present medications. PREPARATION H PRN.                           - Repeat colonoscopy at next available appointment                            (within 3 months) AT [place] because the  examination was incomplete.                           - Return to GI office in 6 months. Procedure Code(s):        --- Professional ---                           [phone], 4, Colonoscopy, flexible; diagnostic,                            including collection of specimen(s) by brushing or                            washing, when performed (separate procedure) Diagnosis Code(s):        --- Professional ---                           K64.8, Other hemorrhoids                           K92.1, Melena (includes Hematochezia)                           K57.30, Diverticulosis of large intestine without                            perforation or abscess without bleeding                           Q43.8, Other specified congenital malformations of                            intestine CPT copyright 2019 American Medical Association. All rights reserved. The codes documented in this report are preliminary and upon coder review may  be revised to meet current compliance requirements. Barney Drain, MD Barney Drain MD, MD 01/08/2019 1:39:50 PM This report has been signed electronically. Number of Addenda: 0

## 2019-01-08 NOTE — Discharge Instructions (Signed)
I WAS UNABLE TO COMPLETE YOUR COLONOSCOPY DUE TO A NARROWING IN YOUR COLON. I USED THREE DIFFERENT SCOPES TO TRY TO GET PAST THIS AREA IN YOUR COLON. Your rectal bleeding is due to internal hemorrhoids and diverticulosis IN YOUR LEFT COLON. You have a narrowing (STRICTURE) in your esophagus, a moderate size hiatal hernia, and EROSIVE gastritis/duodenitis due to aspirin and MOBIC. I biopsied your stomach.   CUT DOWN ON ALCOHOL USE.  FOLLOW A HIGH FIBER/LOW FAT DIET. AVOID ITEMS THAT CAUSE BLOATING. SEE INFO BELOW.  TO PREVENT ULCERS AND GASTRITIS DUE TO MOBIC/ASPIRIN/VODKA, START OMEPRAZOLE. TAKE 30 MINUTES PRIOR TO MEALS TWICE DAILY THEN ONCE DAILY FOREVER.    YOUR BIOPSY RESULTS WILL BE BACK IN 5 BUSINESS DAYS.   COMPLETE CT SCAN WITH RECTAL CONTRAST WITHIN 7 DAYS TO LOOK AT THE LAST PART OF YOUR COLON.  REPEAT COLONOSCOPY WITHIN THE NEXT 3 MOS AT [facility] WITH XRAY TO HELP GET PAST THE AREA IN YOUR COLON.  FOLLOW UP IN 4 MOS.    ENDOSCOPY Care After Read the instructions outlined below and refer to this sheet in the next week. These discharge instructions provide you with general information on caring for yourself after you leave the hospital. While your treatment has been planned according to the most current medical practices available, unavoidable complications occasionally occur. If you have any problems or questions after discharge, call DR. Onalee Steinbach, [phone].  ACTIVITY  You may resume your regular activity, but move at a slower pace for the next 24 hours.   Take frequent rest periods for the next 24 hours.   Walking will help get rid of the air and reduce the bloated feeling in your belly (abdomen).   No driving for 24 hours (because of the medicine (anesthesia) used during the test).   You may shower.   Do not sign any important legal documents or operate any machinery for 24 hours (because of the anesthesia used during the test).    NUTRITION  Drink plenty of  fluids.   You may resume your normal diet as instructed by your doctor.   Begin with a light meal and progress to your normal diet. Heavy or fried foods are harder to digest and may make you feel sick to your stomach (nauseated).   Avoid alcoholic beverages for 24 hours or as instructed.    MEDICATIONS  You may resume your normal medications.   WHAT YOU CAN EXPECT TODAY  Some feelings of bloating in the abdomen.   Passage of more gas than usual.   Spotting of blood in your stool or on the toilet paper  .  IF YOU HAD POLYPS REMOVED DURING THE ENDOSCOPY:  Eat a soft diet IF YOU HAVE NAUSEA, BLOATING, ABDOMINAL PAIN, OR VOMITING.    FINDING OUT THE RESULTS OF YOUR TEST Not all test results are available during your visit. DR. Oneida Alar WILL CALL YOU WITHIN 14 DAYS OF YOUR PROCEDUE WITH YOUR RESULTS. Do not assume everything is normal if you have not heard from DR. Jawuan Robb, CALL HER OFFICE AT [phone].  SEEK IMMEDIATE MEDICAL ATTENTION AND CALL THE OFFICE: [phone] IF:  You have more than a spotting of blood in your stool.   Your belly is swollen (abdominal distention).   You are nauseated or vomiting.   You have a temperature over 101F.   You have abdominal pain or discomfort that is severe or gets worse throughout the day.  Gastritis/DUODENITIS  Gastritis is an inflammation (the body's way of  reacting to injury and/or infection) of the stomach. DUODENITIS is an inflammation (the body's way of reacting to injury and/or infection) of the FIRST PART OF THE SMALL INTESTINES. It is often caused by bacterial (germ) infections. It can also be caused BY ASPIRIN, BC/GOODY POWDER'S, (IBUPROFEN) MOTRIN, OR ALEVE (NAPROXEN), chemicals (including alcohol), SPICY FOODS, and medications. This illness may be associated with generalized malaise (feeling tired, not well), UPPER ABDOMINAL STOMACH cramps, and fever. One common bacterial cause of gastritis is an organism known as H. Pylori.  This can be treated with antibiotics.    High-Fiber Diet A high-fiber diet changes your normal diet to include more whole grains, legumes, fruits, and vegetables. Changes in the diet involve replacing refined carbohydrates with unrefined foods. The calorie level of the diet is essentially unchanged. The Dietary Reference Intake (recommended amount) for adult males is 38 grams per day. For adult females, it is 25 grams per day. Pregnant and lactating women should consume 28 grams of fiber per day. Fiber is the intact part of a plant that is not broken down during digestion. Functional fiber is fiber that has been isolated from the plant to provide a beneficial effect in the body.  PURPOSE  Increase stool bulk.   Ease and regulate bowel movements.   Lower cholesterol.  REDUCE RISK OF COLON CANCER  INDICATIONS THAT YOU NEED MORE FIBER  Constipation and hemorrhoids.   Uncomplicated diverticulosis (intestine condition) and irritable bowel syndrome.   Weight management.   As a protective measure against hardening of the arteries (atherosclerosis), diabetes, and cancer.  INCLUDE A VARIETY OF FIBER SOURCES Replace refined and processed grains with whole gra GUIDELINES FOR INCREASING FIBER IN THE DIET  Start adding fiber to the diet slowly. A gradual increase of about 5 more grams (2 slices of whole-wheat bread, 2 servings of most fruits or vegetables, or 1 bowl of high-fiber cereal) per day is best. Too rapid an increase in fiber may result in constipation, flatulence, and bloating.   Drink enough water and fluids to keep your urine clear or pale yellow. Water, juice, or caffeine-free drinks are recommended. Not drinking enough fluid may cause constipation.   Eat a variety of high-fiber foods rather than one type of fiber.   Try to increase your intake of fiber through using high-fiber foods rather than fiber pills or supplements that contain small amounts of fiber.   The goal is to  change the types of food eaten. Do not supplement your present diet with high-fiber foods, but replace foods in your present diet.    ins, canned fruits with fresh fruits, and incorporate other fiber sources. White rice, white breads, and most bakery goods contain little or no fiber.   Brown whole-grain rice, buckwheat oats, and many fruits and vegetables are all good sources of fiber. These include: broccoli, Brussels sprouts, cabbage, cauliflower, beets, sweet potatoes, white potatoes (skin on), carrots, tomatoes, eggplant, squash, berries, fresh fruits, and dried fruits.   Cereals appear to be the richest source of fiber. Cereal fiber is found in whole grains and bran. Bran is the fiber-rich outer coat of cereal grain, which is largely removed in refining. In whole-grain cereals, the bran remains. In breakfast cereals, the largest amount of fiber is found in those with "bran" in their names. The fiber content is sometimes indicated on the label.   You may need to include additional fruits and vegetables each day.   In baking, for 1 cup white flour, you may use  the following substitutions:   1 cup whole-wheat flour minus 2 tablespoons.   1/2 cup white flour plus 1/2 cup whole-wheat flour.    Diverticulosis Diverticulosis is a common condition that develops when small pouches (diverticula) form in the wall of the colon. The risk of diverticulosis increases with age. It happens more often in people who eat a low-fiber diet. Most individuals with diverticulosis have no symptoms. Those individuals with symptoms usually experience belly (abdominal) pain, constipation, or loose stools (diarrhea).  HOME CARE INSTRUCTIONS  Increase the amount of fiber in your diet as directed by your caregiver or dietician. This may reduce symptoms of diverticulosis.   Drink at least 6 to 8 glasses of water each day to prevent constipation.   Try not to strain when you have a bowel movement.   Avoiding nuts  and seeds to prevent complications is NOT NECESSARY.    SEEK IMMEDIATE MEDICAL CARE IF:  You develop increasing pain or severe bloating.   You have an oral temperature above 101F.   You develop vomiting or bowel movements that are bloody or black.

## 2019-01-08 NOTE — Transfer of Care (Signed)
Immediate Anesthesia Transfer of Care Note  Patient: Rykin Route  Procedure(s) Performed: COLONOSCOPY WITH PROPOFOL (N/A ) ESOPHAGOGASTRODUODENOSCOPY (EGD) WITH PROPOFOL (N/A ) BIOPSY  Patient Location: PACU  Anesthesia Type:General  Level of Consciousness: awake  Airway & Oxygen Therapy: Patient Spontanous Breathing  Post-op Assessment: Report given to RN  Post vital signs: Reviewed  Last Vitals:  Vitals Value Taken Time  BP 111/78 01/08/19 1318  Temp 36.7 C 01/08/19 1318  Pulse 80 01/08/19 1323  Resp 22 01/08/19 1323  SpO2 99 % 01/08/19 1323  Vitals shown include unvalidated device data.  Last Pain:  Vitals:   01/08/19 1318  TempSrc:   PainSc: 0-No pain         Complications: No apparent anesthesia complications

## 2019-01-08 NOTE — Anesthesia Postprocedure Evaluation (Signed)
Anesthesia Post Note  Patient: Scott Jackson  Procedure(s) Performed: COLONOSCOPY WITH PROPOFOL (N/A ) ESOPHAGOGASTRODUODENOSCOPY (EGD) WITH PROPOFOL (N/A ) BIOPSY  Patient location during evaluation: PACU Anesthesia Type: General Level of consciousness: awake and alert and oriented Pain management: pain level controlled Vital Signs Assessment: post-procedure vital signs reviewed and stable Respiratory status: spontaneous breathing Cardiovascular status: blood pressure returned to baseline and stable Postop Assessment: no apparent nausea or vomiting Anesthetic complications: no     Last Vitals:  Vitals:   01/08/19 1202 01/08/19 1318  BP: (!) 168/97 111/78  Pulse:  76  Resp:  14  Temp:  36.7 C  SpO2:  100%    Last Pain:  Vitals:   01/08/19 1318  TempSrc:   PainSc: 0-No pain                 Laine Fonner

## 2019-01-08 NOTE — H&P (Addendum)
Primary Care Physician:  Caren Macadam, MD Primary Gastroenterologist:  Dr. Oneida Alar  Pre-Procedure History & Physical: HPI:  Scott Jackson is a 68 y.o. male here for DYSPHAGIA/brbpr.  Past Medical History:  Diagnosis Date  . ETOH abuse   . Hypertension   . Pulmonary nodule   . Tobacco abuse     Past Surgical History:  Procedure Laterality Date  . ANKLE FRACTURE SURGERY    . APPENDECTOMY    . COLONOSCOPY  01/2015   Dr. Posey Pronto: two 2-75mm rectal polyps removed, hyperplastic  . COLONOSCOPY  2007   Dr. Posey Pronto: three adenomas removed measuring 4, 10, 6mm. diverticulosis  . intestininal blockage  1970    Prior to Admission medications   Medication Sig Start Date End Date Taking? Authorizing Provider  amLODipine (NORVASC) 10 MG tablet Take 10 mg by mouth daily. 11/20/18  Yes [provider]  aspirin 81 MG chewable tablet Chew 81 mg by mouth daily.    Yes [provider]  lisinopril-hydrochlorothiazide (PRINZIDE,ZESTORETIC) 10-12.5 MG tablet Take 1 tablet by mouth every morning. 02/01/18  Yes Hagler, Apolonio Schneiders, MD  meloxicam (MOBIC) 7.5 MG tablet Take 1 tablet (7.5 mg total) by mouth daily. 11/27/18  Yes Sanjuana Kava, MD  Multiple Vitamin (MULTIVITAMIN WITH MINERALS) TABS tablet Take 1 tablet by mouth daily.   Yes [provider]  polyethylene glycol-electrolytes (TRILYTE) 420 g solution Take 4,000 mLs by mouth as directed. 09/28/18  Yes [name], Marga Melnick, MD  vitamin B-12 (CYANOCOBALAMIN) 1000 MCG tablet Take 1,000 mcg by mouth daily.   Yes [provider]  polyethylene glycol powder (GLYCOLAX/MIRALAX) powder Take one capful twice per day until soft stool, then continue once daily to maintain soft stool. Hold if diarrhea. Patient not taking: Reported on 12/31/2018 09/28/18   Mahala Menghini, PA-C    Allergies as of 01/07/2019  . (No Known Allergies)    Family History  Problem Relation Age of Onset  . Hypertension Mother   . Stroke Mother   . Fibromyalgia  Daughter   . Lupus Daughter   . Anxiety disorder Daughter   . Post-traumatic stress disorder Daughter   . Asthma Daughter   . Breast cancer Sister   . Breast cancer Sister   . Colon cancer Neg Hx     Social History   Socioeconomic History  . Marital status: Married    Spouse name: Not on file  . Number of children: Not on file  . Years of education: Not on file  . Highest education level: Not on file  Occupational History  . Not on file  Social Needs  . Financial resource strain: Not on file  . Food insecurity    Worry: Not on file    Inability: Not on file  . Transportation needs    Medical: Not on file    Non-medical: Not on file  Tobacco Use  . Smoking status: Current Every Day Smoker    Packs/day: 1.50    Years: 50.00    Pack years: 75.00  . Smokeless tobacco: Never Used  Substance and Sexual Activity  . Alcohol use: Yes    Alcohol/week: 4.0 standard drinks    Types: 4 Shots of liquor per week    Comment: daily  . Drug use: No  . Sexual activity: Yes  Lifestyle  . Physical activity    Days per week: Not on file    Minutes per session: Not on file  . Stress: Not on file  Relationships  .  Social Herbalist on phone: Not on file    Gets together: Not on file    Attends religious service: Not on file    Active member of club or organization: Not on file    Attends meetings of clubs or organizations: Not on file    Relationship status: Not on file  . Intimate partner violence    Fear of current or ex partner: Not on file    Emotionally abused: Not on file    Physically abused: Not on file    Forced sexual activity: Not on file  Other Topics Concern  . Not on file  Social History Narrative   Works at [employer] in [place], [place].   Separated.   Colon, hunting.     Review of Systems: See HPI, otherwise negative ROS   Physical Exam: Pulse 80   Temp 98.2 F (36.8 C) (Oral)   Resp (!) 21   Ht 6\' 1"  (1.854 m)   Wt 65.7 kg   BMI  19.11 kg/m  General:   Alert,  pleasant and cooperative in NAD Head:  Normocephalic and atraumatic. Neck:  Supple; Lungs:  Clear throughout to auscultation.    Heart:  Regular rate and rhythm. Abdomen:  Soft, nontender and nondistended. Normal bowel sounds, without guarding, and without rebound.   Neurologic:  Alert and  oriented x4;  grossly normal neurologically.  Impression/Plan:   DYSPHAGIA/brbpr-POSSIBLE HEMORRHOID BANDING  PLAN:  TCS/EGD/DIL TODAY. DISCUSSED PROCEDURE, BENEFITS, & RISKS: < 1% chance of medication reaction, bleeding, perforation, ASPIRATION, PELVIC VEIN SEPSIS, or rupture of spleen/liver requiring surgery to fix it and missed polyps < 1 cm 10-20% of the time.

## 2019-01-08 NOTE — Anesthesia Preprocedure Evaluation (Signed)
Anesthesia Evaluation  Patient identified by MRN, date of birth, ID band Patient awake    Reviewed: Allergy & Precautions, NPO status , Patient's Chart, lab work & pertinent test results  History of Anesthesia Complications (+) PONV  Airway Mallampati: II  TM Distance: >3 FB Neck ROM: Full    Dental no notable dental hx. (+) Poor Dentition, Missing   Pulmonary COPD, Current Smoker,  Smoker -states now under 1 ppd -denies any inhaler or o2 use    Pulmonary exam normal breath sounds clear to auscultation       Cardiovascular Exercise Tolerance: Good hypertension, Pt. on medications negative cardio ROS Normal cardiovascular examI Rhythm:Regular Rate:Normal  Denies CP/DOE Normal ECG-Normal EST 01/18/2018   Neuro/Psych PSYCHIATRIC DISORDERS negative neurological ROS     GI/Hepatic negative GI ROS, Neg liver ROS,   Endo/Other  negative endocrine ROS  Renal/GU negative Renal ROS  negative genitourinary   Musculoskeletal negative musculoskeletal ROS (+)   Abdominal   Peds negative pediatric ROS (+)  Hematology negative hematology ROS (+)   Anesthesia Other Findings   Reproductive/Obstetrics negative OB ROS                             Anesthesia Physical Anesthesia Plan  ASA: II  Anesthesia Plan: General   Post-op Pain Management:    Induction: Intravenous  PONV Risk Score and Plan: 2 and TIVA, Propofol infusion and Treatment may vary due to age or medical condition  Airway Management Planned: Nasal Cannula and Simple Face Mask  Additional Equipment:   Intra-op Plan:   Post-operative Plan: Extubation in OR  Informed Consent: I have reviewed the patients History and Physical, chart, labs and discussed the procedure including the risks, benefits and alternatives for the proposed anesthesia with the patient or authorized representative who has indicated his/her understanding and  acceptance.     Dental advisory given  Plan Discussed with: CRNA  Anesthesia Plan Comments: (Plan Full PPE use  Plan GA with GETA PRN-WTP same after Q&A)        Anesthesia Quick Evaluation

## 2019-01-08 NOTE — Op Note (Signed)
[facility] Patient Name: Scott Jackson Procedure Date: 01/08/2019 1:01 PM MRN: [id number] Date of Birth: [birth year] Attending MD: Barney Drain MD, MD CSN: [id number] Age: 68 Admit Type: Outpatient Procedure:                Upper GI endoscopy WITH COLD FORCEPS BIOPSY Indications:              Epigastric abdominal pain IN PT ON ASA/MOBIC AND                            DRINKS UP TO 1/5 VODKA PER WEEK. Providers:                Barney Drain MD, MD, Gwenlyn Fudge RN, RN, Randa Spike, Technician Referring MD:             Caren Macadam Medicines:                Propofol per Anesthesia Complications:            No immediate complications. Estimated Blood Loss:     Estimated blood loss was minimal. Procedure:                Pre-Anesthesia Assessment:                           - Prior to the procedure, a History and Physical                            was performed, and patient medications and                            allergies were reviewed. The patient's tolerance of                            previous anesthesia was also reviewed. The risks                            and benefits of the procedure and the sedation                            options and risks were discussed with the patient.                            All questions were answered, and informed consent                            was obtained. Prior Anticoagulants: The patient                            Mobic. ASA Grade Assessment: II - A patient with                            mild systemic disease. After reviewing the risks  and benefits, the patient was deemed in                            satisfactory condition to undergo the procedure.                            After obtaining informed consent, the endoscope was                            passed under direct vision. Throughout the                            procedure, the patient's blood pressure, pulse, and                          oxygen saturations were monitored continuously. The                            GIF-H190 ([id number]) scope was introduced through the                            mouth, and advanced to the second part of duodenum.                            The upper GI endoscopy was accomplished without                            difficulty. The patient tolerated the procedure                            well. Scope In: 1:05:40 PM Scope Out: 1:10:04 PM Total Procedure Duration: 0 hours 4 minutes 24 seconds  Findings:      A low-grade of narrowing Schatzki ring was found at the gastroesophageal       junction.      A medium-sized hiatal hernia was present.      Diffuse moderate inflammation characterized by congestion (edema),       erosions and erythema was found in the entire examined stomach.       Biopsies(2: BODY, 1: INCISURA, 2: ANTRUM) were taken with a cold forceps       for Helicobacter pylori testing.      Diffuse moderate inflammation characterized by congestion (edema),       erosions and erythema was found in the duodenal bulb.      The second portion of the duodenum was normal. Impression:               - Low-grade of narrowing Schatzki ring.                           - Medium-sized hiatal hernia.                           - NORMOCYTIC ANEMIA AND EPIGASTRIC PAIN DUE TO                            EROSIVE Gastritis/DUODENITIS  DUE TO NSAIDS/ETOH. Moderate Sedation:      Per Anesthesia Care Recommendation:           - Patient has a contact number available for                            emergencies. The signs and symptoms of potential                            delayed complications were discussed with the                            patient. Return to normal activities tomorrow.                            Written discharge instructions were provided to the                            patient.                           - High fiber diet.                           - Continue  present medications. ADD OMEPRAZOLE BID                            FOR ONE MONTH THEN DAILY.                           - Await pathology results.                           - Return to GI office in 6 months. Procedure Code(s):        --- Professional ---                           [phone], Esophagogastroduodenoscopy, flexible,                            transoral; with biopsy, single or multiple Diagnosis Code(s):        --- Professional ---                           K22.2, Esophageal obstruction                           K44.9, Diaphragmatic hernia without obstruction or                            gangrene                           K29.70, Gastritis, unspecified, without bleeding                           K29.80, Duodenitis without bleeding  R10.13, Epigastric pain CPT copyright 2019 American Medical Association. All rights reserved. The codes documented in this report are preliminary and upon coder review may  be revised to meet current compliance requirements. Barney Drain, MD Barney Drain MD, MD 01/08/2019 1:50:38 PM This report has been signed electronically. Number of Addenda: 0

## 2019-01-10 ENCOUNTER — Telehealth: Payer: Self-pay | Admitting: Gastroenterology

## 2019-01-10 DIAGNOSIS — K625 Hemorrhage of anus and rectum: Secondary | ICD-10-CM

## 2019-01-10 NOTE — Addendum Note (Signed)
Addended by: Inge Rise on: 01/10/2019 11:11 AM   Modules accepted: Orders

## 2019-01-10 NOTE — Telephone Encounter (Signed)
Referral faxed to [facility]. PA pending for CT pelvis via ev[employer]ore webiste. Case# [id number]. Clinicals uploaded

## 2019-01-10 NOTE — Telephone Encounter (Signed)
PT's daughter, [name], is aware.

## 2019-01-10 NOTE — Progress Notes (Signed)
CC'D TO PCP °

## 2019-01-10 NOTE — Telephone Encounter (Signed)
Please call pt. His stomach Bx shows gastritis DUE TO ASA, MOBIC, AND ETOH..    CUT DOWN ON ALCOHOL USE.  FOLLOW A HIGH FIBER/LOW FAT DIET. AVOID ITEMS THAT CAUSE BLOATING.   TO PREVENT ULCERS AND GASTRITIS DUE TO MOBIC/ASPIRIN/VODKA, START OMEPRAZOLE. TAKE 30 MINUTES PRIOR TO MEALS TWICE DAILY THEN ONCE DAILY FOREVER.  COMPLETE CT SCAN PELVIS WITH RECTAL CONTRAST WITHIN 7 DAYS TO LOOK AT THE LAST PART OF YOUR COLON.  REPEAT COLONOSCOPY WITHIN THE NEXT 3 MOS AT [facility] WITH XRAY TO HELP GET PAST THE AREA IN YOUR COLON.  FOLLOW UP IN 4 MOS.

## 2019-01-10 NOTE — Addendum Note (Signed)
Addended by: Inge Rise on: 01/10/2019 10:44 AM   Modules accepted: Orders

## 2019-01-14 ENCOUNTER — Encounter (HOSPITAL_COMMUNITY): Payer: Self-pay | Admitting: Gastroenterology

## 2019-01-14 NOTE — Telephone Encounter (Signed)
PA still pending.  

## 2019-01-15 NOTE — Telephone Encounter (Signed)
PA has been authorized. Auth# [id number] Dates 01/15/2019-07/14/2019

## 2019-01-15 NOTE — Telephone Encounter (Signed)
CT scheduled for 6/25 at 7pm, arrival time 6:45pm.  Called patient and spoke with daughter [name]. She is aware of appt details.

## 2019-01-15 NOTE — Telephone Encounter (Signed)
Checked evicore and PA is still pending

## 2019-01-17 ENCOUNTER — Ambulatory Visit (HOSPITAL_COMMUNITY): Payer: Medicare HMO

## 2019-01-18 ENCOUNTER — Ambulatory Visit (HOSPITAL_COMMUNITY)
Admission: RE | Admit: 2019-01-18 | Discharge: 2019-01-18 | Disposition: A | Discharge status: Home / Self Care | Payer: Medicare HMO | Source: Ambulatory Visit | Attending: Gastroenterology | Admitting: Gastroenterology

## 2019-01-18 ENCOUNTER — Ambulatory Visit (HOSPITAL_COMMUNITY): Payer: Medicare HMO

## 2019-01-18 ENCOUNTER — Other Ambulatory Visit: Payer: Self-pay

## 2019-01-18 DIAGNOSIS — I700 Atherosclerosis of aorta: Secondary | ICD-10-CM | POA: Insufficient documentation

## 2019-01-18 DIAGNOSIS — M160 Bilateral primary osteoarthritis of hip: Secondary | ICD-10-CM | POA: Diagnosis not present

## 2019-01-18 DIAGNOSIS — K5900 Constipation, unspecified: Secondary | ICD-10-CM | POA: Diagnosis not present

## 2019-01-18 DIAGNOSIS — K625 Hemorrhage of anus and rectum: Secondary | ICD-10-CM | POA: Diagnosis not present

## 2019-01-18 DIAGNOSIS — K5730 Diverticulosis of large intestine without perforation or abscess without bleeding: Secondary | ICD-10-CM | POA: Insufficient documentation

## 2019-01-18 DIAGNOSIS — M47816 Spondylosis without myelopathy or radiculopathy, lumbar region: Secondary | ICD-10-CM | POA: Diagnosis not present

## 2019-01-18 MED ORDER — IOHEXOL 300 MG/ML  SOLN
50.0000 mL | Freq: Once | INTRAMUSCULAR | Status: AC | PRN
  Administered 2019-01-18: 16:00:00 50 mL

## 2019-01-21 NOTE — Progress Notes (Signed)
Pt's daughter, [name] is aware. She said he already had appt at [facility] for colonoscopy on 05/10/2019 at 10:00 AM Forwarding FYI to [place].

## 2019-01-24 ENCOUNTER — Encounter: Payer: Self-pay | Admitting: Orthopaedic Surgery

## 2019-01-24 ENCOUNTER — Ambulatory Visit (INDEPENDENT_AMBULATORY_CARE_PROVIDER_SITE_OTHER): Payer: Medicare HMO | Admitting: Orthopaedic Surgery

## 2019-01-24 ENCOUNTER — Other Ambulatory Visit: Payer: Self-pay

## 2019-01-24 VITALS — BP 151/95 | HR 82 | Temp 98.2°F | Ht 73.0 in | Wt 136.0 lb

## 2019-01-24 DIAGNOSIS — R69 Illness, unspecified: Secondary | ICD-10-CM | POA: Diagnosis not present

## 2019-01-24 DIAGNOSIS — F17210 Nicotine dependence, cigarettes, uncomplicated: Secondary | ICD-10-CM

## 2019-01-24 DIAGNOSIS — G8929 Other chronic pain: Secondary | ICD-10-CM | POA: Diagnosis not present

## 2019-01-24 DIAGNOSIS — M25562 Pain in left knee: Secondary | ICD-10-CM | POA: Diagnosis not present

## 2019-01-24 NOTE — Progress Notes (Signed)
Patient Scott Jackson, male DOB:08/26/1950, 68 y.o. LOV:[id number]  Chief Complaint  Patient presents with  . Knee Pain    left    HPI  Scott Jackson is a 68 y.o. male who has left knee pain.  He has good and bad days.  He is better with the warm weather.  He has some swelling and popping but no giving way.  He has no new trauma.   Body mass index is 17.94 kg/m.  ROS  Review of Systems  Constitutional: Positive for activity change.  Musculoskeletal: Positive for arthralgias, gait problem and joint swelling.  All other systems reviewed and are negative.   All other systems reviewed and are negative.  The following is a summary of the past history medically, past history surgically, known current medicines, social history and family history.  This information is gathered electronically by the computer from prior information and documentation.  I review this each visit and have found including this information at this point in the chart is beneficial and informative.    Past Medical History:  Diagnosis Date  . ETOH abuse   . Hypertension   . Pulmonary nodule   . Tobacco abuse     Past Surgical History:  Procedure Laterality Date  . ANKLE FRACTURE SURGERY    . APPENDECTOMY    . BIOPSY  01/08/2019   Procedure: BIOPSY;  Surgeon: Danie Binder, MD;  Location: [facility];  Service: Endoscopy;;  gastric  . COLONOSCOPY  01/2015   Dr. Posey Pronto: two 2-17mm rectal polyps removed, hyperplastic  . COLONOSCOPY  2007   Dr. Posey Pronto: three adenomas removed measuring 4, 10, 57mm. diverticulosis  . COLONOSCOPY WITH PROPOFOL N/A 01/08/2019   Procedure: COLONOSCOPY WITH PROPOFOL;  Surgeon: Danie Binder, MD;  Location: [facility];  Service: Endoscopy;  Laterality: N/A;  12:15pm  . ESOPHAGOGASTRODUODENOSCOPY (EGD) WITH PROPOFOL N/A 01/08/2019   Procedure: ESOPHAGOGASTRODUODENOSCOPY (EGD) WITH PROPOFOL;  Surgeon: Danie Binder, MD;  Location: [facility];  Service: Endoscopy;   Laterality: N/A;  . intestininal blockage  1970    Family History  Problem Relation Age of Onset  . Hypertension Mother   . Stroke Mother   . Fibromyalgia Daughter   . Lupus Daughter   . Anxiety disorder Daughter   . Post-traumatic stress disorder Daughter   . Asthma Daughter   . Breast cancer Sister   . Breast cancer Sister   . Colon cancer Neg Hx     Social History Social History   Tobacco Use  . Smoking status: Current Every Day Smoker    Packs/day: 1.50    Years: 50.00    Pack years: 75.00  . Smokeless tobacco: Never Used  Substance Use Topics  . Alcohol use: Yes    Alcohol/week: 4.0 standard drinks    Types: 4 Shots of liquor per week    Comment: daily  . Drug use: No    No Known Allergies  Current Outpatient Medications  Medication Sig Dispense Refill  . amLODipine (NORVASC) 10 MG tablet Take 10 mg by mouth daily.    Marland Kitchen aspirin 81 MG chewable tablet Chew 81 mg by mouth daily.     Marland Kitchen lisinopril-hydrochlorothiazide (PRINZIDE,ZESTORETIC) 10-12.5 MG tablet Take 1 tablet by mouth every morning. 90 tablet 1  . meloxicam (MOBIC) 7.5 MG tablet Take 1 tablet (7.5 mg total) by mouth daily. 30 tablet 2  . Multiple Vitamin (MULTIVITAMIN WITH MINERALS) TABS tablet Take 1 tablet by mouth daily.    Marland Kitchen  omeprazole (PRILOSEC) 20 MG capsule 1 PO 30 MINS PRIOR TO BREAKFAST. 90 capsule 3  . PARoxetine (PAXIL) 10 MG tablet TAKE 1 TABLET BY MOUTH ONCE DAILY IN THE MORNING    . polyethylene glycol powder (GLYCOLAX/MIRALAX) powder Take one capful twice per day until soft stool, then continue once daily to maintain soft stool. Hold if diarrhea. 527 g 5  . vitamin B-12 (CYANOCOBALAMIN) 1000 MCG tablet Take 1,000 mcg by mouth daily.     No current facility-administered medications for this visit.      Physical Exam  Blood pressure (!) 151/95, pulse 82, height 6\' 1"  (1.854 m), weight 136 lb (61.7 kg).  Constitutional: overall normal hygiene, normal nutrition, well developed, normal  grooming, normal body habitus. Assistive device:none  Musculoskeletal: gait and station Limp left, muscle tone and strength are normal, no tremors or atrophy is present.  .  Neurological: coordination overall normal.  Deep tendon reflex/nerve stretch intact.  Sensation normal.  Cranial nerves II-XII intact.   Skin:   Normal overall no scars, lesions, ulcers or rashes. No psoriasis.  Psychiatric: Alert and oriented x 3.  Recent memory intact, remote memory unclear.  Normal mood and affect. Well groomed.  Good eye contact.  Cardiovascular: overall no swelling, no varicosities, no edema bilaterally, normal temperatures of the legs and arms, no clubbing, cyanosis and good capillary refill.  Lymphatic: palpation is normal.  Left knee has no effusion today, NV intact, the knee is stable.  All other systems reviewed and are negative   The patient has been educated about the nature of the problem(s) and counseled on treatment options.  The patient appeared to understand what I have discussed and is in agreement with it.  Encounter Diagnoses  Name Primary?  . Chronic pain of left knee Yes  . Nicotine dependence, cigarettes, uncomplicated     PLAN Call if any problems.  Precautions discussed.  Continue current medications.   Return to clinic as needed   Electronically Signed Sanjuana Kava, MD 7/2/20209:13 AM

## 2019-01-24 NOTE — Patient Instructions (Signed)
Steps to Quit Smoking Smoking tobacco is the leading cause of preventable death. It can affect almost every organ in the body. Smoking puts you and people around you at risk for many serious, long-lasting (chronic) diseases. Quitting smoking can be hard, but it is one of the best things that you can do for your health. It is never too late to quit. How do I get ready to quit? When you decide to quit smoking, make a plan to help you succeed. Before you quit:  Pick a date to quit. Set a date within the next 2 weeks to give you time to prepare.  Write down the reasons why you are quitting. Keep this list in places where you will see it often.  Tell your family, friends, and co-workers that you are quitting. Their support is important.  Talk with your doctor about the choices that may help you quit.  Find out if your health insurance will pay for these treatments.  Know the people, places, things, and activities that make you want to smoke (triggers). Avoid them. What first steps can I take to quit smoking?  Throw away all cigarettes at home, at work, and in your car.  Throw away the things that you use when you smoke, such as ashtrays and lighters.  Clean your car. Make sure to empty the ashtray.  Clean your home, including curtains and carpets. What can I do to help me quit smoking? Talk with your doctor about taking medicines and seeing a counselor at the same time. You are more likely to succeed when you do both.  If you are pregnant or breastfeeding, talk with your doctor about counseling or other ways to quit smoking. Do not take medicine to help you quit smoking unless your doctor tells you to do so. To quit smoking: Quit right away  Quit smoking totally, instead of slowly cutting back on how much you smoke over a period of time.  Go to counseling. You are more likely to quit if you go to counseling sessions regularly. Take medicine You may take medicines to help you quit. Some  medicines need a prescription, and some you can buy over-the-counter. Some medicines may contain a drug called nicotine to replace the nicotine in cigarettes. Medicines may:  Help you to stop having the desire to smoke (cravings).  Help to stop the problems that come when you stop smoking (withdrawal symptoms). Your doctor may ask you to use:  Nicotine patches, gum, or lozenges.  Nicotine inhalers or sprays.  Non-nicotine medicine that is taken by mouth. Find resources Find resources and other ways to help you quit smoking and remain smoke-free after you quit. These resources are most helpful when you use them often. They include:  Online chats with a counselor.  Phone quitlines.  Printed self-help materials.  Support groups or group counseling.  Text messaging programs.  Mobile phone apps. Use apps on your mobile phone or tablet that can help you stick to your quit plan. There are many free apps for mobile phones and tablets as well as websites. Examples include Quit Guide from the CDC and smokefree.gov  What things can I do to make it easier to quit?   Talk to your family and friends. Ask them to support and encourage you.  Call a phone quitline (1-800-QUIT-NOW), reach out to support groups, or work with a counselor.  Ask people who smoke to not smoke around you.  Avoid places that make you want to smoke,   such as: ? Bars. ? Parties. ? Smoke-break areas at work.  Spend time with people who do not smoke.  Lower the stress in your life. Stress can make you want to smoke. Try these things to help your stress: ? Getting regular exercise. ? Doing deep-breathing exercises. ? Doing yoga. ? Meditating. ? Doing a body scan. To do this, close your eyes, focus on one area of your body at a time from head to toe. Notice which parts of your body are tense. Try to relax the muscles in those areas. How will I feel when I quit smoking? Day 1 to 3 weeks Within the first 24 hours,  you may start to have some problems that come from quitting tobacco. These problems are very bad 2-3 days after you quit, but they do not often last for more than 2-3 weeks. You may get these symptoms:  Mood swings.  Feeling restless, nervous, angry, or annoyed.  Trouble concentrating.  Dizziness.  Strong desire for high-sugar foods and nicotine.  Weight gain.  Trouble pooping (constipation).  Feeling like you may vomit (nausea).  Coughing or a sore throat.  Changes in how the medicines that you take for other issues work in your body.  Depression.  Trouble sleeping (insomnia). Week 3 and afterward After the first 2-3 weeks of quitting, you may start to notice more positive results, such as:  Better sense of smell and taste.  Less coughing and sore throat.  Slower heart rate.  Lower blood pressure.  Clearer skin.  Better breathing.  Fewer sick days. Quitting smoking can be hard. Do not give up if you fail the first time. Some people need to try a few times before they succeed. Do your best to stick to your quit plan, and talk with your doctor if you have any questions or concerns. Summary  Smoking tobacco is the leading cause of preventable death. Quitting smoking can be hard, but it is one of the best things that you can do for your health.  When you decide to quit smoking, make a plan to help you succeed.  Quit smoking right away, not slowly over a period of time.  When you start quitting, seek help from your doctor, family, or friends. This information is not intended to replace advice given to you by your health care provider. Make sure you discuss any questions you have with your health care provider. Document Released: 05/07/2009 Document Revised: 09/28/2018 Document Reviewed: 09/29/2018 Elsevier Patient Education  2020 [employer].  

## 2019-01-30 ENCOUNTER — Emergency Department (HOSPITAL_COMMUNITY): Payer: Medicare HMO

## 2019-01-30 ENCOUNTER — Emergency Department (HOSPITAL_COMMUNITY)
Admission: EM | Admit: 2019-01-30 | Discharge: 2019-01-30 | Disposition: A | Discharge status: Home / Self Care | Payer: Medicare HMO | Attending: Emergency Medicine | Admitting: Emergency Medicine

## 2019-01-30 ENCOUNTER — Other Ambulatory Visit: Payer: Self-pay

## 2019-01-30 ENCOUNTER — Encounter (HOSPITAL_COMMUNITY): Payer: Self-pay

## 2019-01-30 DIAGNOSIS — F17210 Nicotine dependence, cigarettes, uncomplicated: Secondary | ICD-10-CM | POA: Diagnosis not present

## 2019-01-30 DIAGNOSIS — Z79899 Other long term (current) drug therapy: Secondary | ICD-10-CM | POA: Diagnosis not present

## 2019-01-30 DIAGNOSIS — I10 Essential (primary) hypertension: Secondary | ICD-10-CM | POA: Insufficient documentation

## 2019-01-30 DIAGNOSIS — N179 Acute kidney failure, unspecified: Secondary | ICD-10-CM | POA: Diagnosis not present

## 2019-01-30 DIAGNOSIS — S0990XA Unspecified injury of head, initial encounter: Secondary | ICD-10-CM | POA: Diagnosis not present

## 2019-01-30 DIAGNOSIS — E785 Hyperlipidemia, unspecified: Secondary | ICD-10-CM | POA: Diagnosis not present

## 2019-01-30 DIAGNOSIS — G40909 Epilepsy, unspecified, not intractable, without status epilepticus: Secondary | ICD-10-CM | POA: Diagnosis not present

## 2019-01-30 DIAGNOSIS — G40901 Epilepsy, unspecified, not intractable, with status epilepticus: Secondary | ICD-10-CM | POA: Diagnosis not present

## 2019-01-30 DIAGNOSIS — G4089 Other seizures: Secondary | ICD-10-CM | POA: Diagnosis not present

## 2019-01-30 DIAGNOSIS — R569 Unspecified convulsions: Secondary | ICD-10-CM | POA: Diagnosis not present

## 2019-01-30 DIAGNOSIS — R404 Transient alteration of awareness: Secondary | ICD-10-CM | POA: Diagnosis not present

## 2019-01-30 DIAGNOSIS — R69 Illness, unspecified: Secondary | ICD-10-CM | POA: Diagnosis not present

## 2019-01-30 DIAGNOSIS — R000 Tachycardia, unspecified: Secondary | ICD-10-CM | POA: Diagnosis not present

## 2019-01-30 DIAGNOSIS — R0689 Other abnormalities of breathing: Secondary | ICD-10-CM | POA: Diagnosis not present

## 2019-01-30 LAB — CBC WITH DIFFERENTIAL/PLATELET
Abs Immature Granulocytes: 0.03 10*3/uL (ref 0.00–0.07)
Basophils Absolute: 0 10*3/uL (ref 0.0–0.1)
Basophils Relative: 0 %
Eosinophils Absolute: 0 10*3/uL (ref 0.0–0.5)
Eosinophils Relative: 0 %
HCT: 41.4 % (ref 39.0–52.0)
Hemoglobin: 13.5 g/dL (ref 13.0–17.0)
Immature Granulocytes: 0 %
Lymphocytes Relative: 13 %
Lymphs Abs: 0.9 10*3/uL (ref 0.7–4.0)
MCH: 33.1 pg (ref 26.0–34.0)
MCHC: 32.6 g/dL (ref 30.0–36.0)
MCV: 101.5 fL — ABNORMAL HIGH (ref 80.0–100.0)
Monocytes Absolute: 0.5 10*3/uL (ref 0.1–1.0)
Monocytes Relative: 7 %
Neutro Abs: 5.5 10*3/uL (ref 1.7–7.7)
Neutrophils Relative %: 80 %
Platelets: 220 10*3/uL (ref 150–400)
RBC: 4.08 MIL/uL — ABNORMAL LOW (ref 4.22–5.81)
RDW: 16.4 % — ABNORMAL HIGH (ref 11.5–15.5)
WBC: 7 10*3/uL (ref 4.0–10.5)
nRBC: 0.3 % — ABNORMAL HIGH (ref 0.0–0.2)

## 2019-01-30 LAB — RAPID URINE DRUG SCREEN, HOSP PERFORMED
Amphetamines: NOT DETECTED
Barbiturates: NOT DETECTED
Benzodiazepines: NOT DETECTED
Cocaine: NOT DETECTED
Opiates: NOT DETECTED
Tetrahydrocannabinol: NOT DETECTED

## 2019-01-30 LAB — BASIC METABOLIC PANEL
Anion gap: >20 — ABNORMAL HIGH (ref 5–15)
BUN: 25 mg/dL — ABNORMAL HIGH (ref 8–23)
CO2: 19 mmol/L — ABNORMAL LOW (ref 22–32)
Calcium: 9.3 mg/dL (ref 8.9–10.3)
Chloride: 90 mmol/L — ABNORMAL LOW (ref 98–111)
Creatinine, Ser: 1.64 mg/dL — ABNORMAL HIGH (ref 0.61–1.24)
GFR calc Af Amer: 49 mL/min — ABNORMAL LOW (ref >60)
GFR calc non Af Amer: 43 mL/min — ABNORMAL LOW (ref >60)
Glucose, Bld: 139 mg/dL — ABNORMAL HIGH (ref 70–99)
Potassium: 3.6 mmol/L (ref 3.5–5.1)
Sodium: 131 mmol/L — ABNORMAL LOW (ref 135–145)

## 2019-01-30 LAB — ETHANOL: Alcohol, Ethyl (B): <10 mg/dL (ref <10)

## 2019-01-30 NOTE — ED Provider Notes (Addendum)
[facility] EMERGENCY DEPARTMENT Provider Note   CSN: [id number] Arrival date & time: 01/30/19  1507    History   Chief Complaint Chief Complaint  Patient presents with  . Seizures    HPI Scott Jackson is a 68 y.o. male.     Level 5 caveat for uncertainty of history.  Patient reports he got hot and shaky earlier today.  Bystanders witnessed a grand mal seizure for approximately 10 seconds.  He was initially postictal.  In the emergency department he appears back to normal.  I was unable to find a history of seizure disorder in his PMH.     Past Medical History:  Diagnosis Date  . ETOH abuse   . Hypertension   . Pulmonary nodule   . Tobacco abuse     Patient Active Problem List   Diagnosis Date Noted  . Abdominal pain, epigastric   . Hemorrhoid 09/28/2018  . Rectal bleeding 09/28/2018  . Constipation 09/28/2018  . Screen for sexually transmitted diseases 11/24/2017  . Hyperlipidemia 11/24/2017  . HTN, goal below 140/90 11/24/2017  . Screening for viral disease 11/24/2017  . Alcohol abuse 11/24/2017  . Alcohol abuse counseling and surveillance of alcoholic 37/16/9678    Past Surgical History:  Procedure Laterality Date  . ANKLE FRACTURE SURGERY    . APPENDECTOMY    . BIOPSY  01/08/2019   Procedure: BIOPSY;  Surgeon: Danie Binder, MD;  Location: [facility];  Service: Endoscopy;;  gastric  . COLONOSCOPY  01/2015   Dr. Posey Pronto: two 2-21mm rectal polyps removed, hyperplastic  . COLONOSCOPY  2007   Dr. Posey Pronto: three adenomas removed measuring 4, 10, 16mm. diverticulosis  . COLONOSCOPY WITH PROPOFOL N/A 01/08/2019   Procedure: COLONOSCOPY WITH PROPOFOL;  Surgeon: Danie Binder, MD;  Location: [facility];  Service: Endoscopy;  Laterality: N/A;  12:15pm  . ESOPHAGOGASTRODUODENOSCOPY (EGD) WITH PROPOFOL N/A 01/08/2019   Procedure: ESOPHAGOGASTRODUODENOSCOPY (EGD) WITH PROPOFOL;  Surgeon: Danie Binder, MD;  Location: [facility];  Service: Endoscopy;   Laterality: N/A;  . intestininal blockage  1970        Home Medications    Prior to Admission medications   Medication Sig Start Date End Date Taking? Authorizing Provider  amLODipine (NORVASC) 10 MG tablet Take 10 mg by mouth daily. 11/20/18  Yes [provider]  lisinopril-hydrochlorothiazide (PRINZIDE,ZESTORETIC) 10-12.5 MG tablet Take 1 tablet by mouth every morning. 02/01/18  Yes Hagler, Apolonio Schneiders, MD  Multiple Vitamin (MULTIVITAMIN WITH MINERALS) TABS tablet Take 1 tablet by mouth daily.   Yes [provider]  omeprazole (PRILOSEC) 20 MG capsule 1 PO 30 MINS PRIOR TO BREAKFAST. Patient taking differently: Take 20 mg by mouth daily before breakfast. 1 PO 30 MINS PRIOR TO BREAKFAST. 01/08/19  Yes Fields, Sandi L, MD  PARoxetine (PAXIL) 10 MG tablet Take 10 mg by mouth every morning.  12/31/18  Yes [provider]  vitamin B-12 (CYANOCOBALAMIN) 1000 MCG tablet Take 1,000 mcg by mouth daily.   Yes [provider]  meloxicam (MOBIC) 7.5 MG tablet Take 1 tablet (7.5 mg total) by mouth daily. Patient not taking: Reported on 01/30/2019 11/27/18   Sanjuana Kava, MD    Family History Family History  Problem Relation Age of Onset  . Hypertension Mother   . Stroke Mother   . Fibromyalgia Daughter   . Lupus Daughter   . Anxiety disorder Daughter   . Post-traumatic stress disorder Daughter   . Asthma Daughter   . Breast  cancer Sister   . Breast cancer Sister   . Colon cancer Neg Hx     Social History Social History   Tobacco Use  . Smoking status: Current Every Day Smoker    Packs/day: 1.50    Years: 50.00    Pack years: 75.00  . Smokeless tobacco: Never Used  Substance Use Topics  . Alcohol use: Yes    Alcohol/week: 4.0 standard drinks    Types: 4 Shots of liquor per week    Comment: daily  . Drug use: No     Allergies   Patient has no known allergies.   Review of Systems Review of Systems  Unable to perform ROS: Mental status change      Physical Exam Updated Vital Signs BP 120/73 (BP Location: Right Arm)   Pulse 79   Temp 97.6 F (36.4 C) (Oral)   Resp (!) 27   Ht 6\' 1"  (1.854 m)   Wt 61 kg   SpO2 99%   BMI 17.74 kg/m   Physical Exam Vitals signs and nursing note reviewed.  Constitutional:      Appearance: He is well-developed.  HENT:     Head: Normocephalic and atraumatic.  Eyes:     Conjunctiva/sclera: Conjunctivae normal.  Neck:     Musculoskeletal: Neck supple.  Cardiovascular:     Rate and Rhythm: Normal rate and regular rhythm.  Pulmonary:     Effort: Pulmonary effort is normal.     Breath sounds: Normal breath sounds.  Abdominal:     General: Bowel sounds are normal.     Palpations: Abdomen is soft.  Musculoskeletal: Normal range of motion.  Skin:    General: Skin is warm and dry.  Neurological:     Mental Status: He is alert and oriented to person, place, and time.  Psychiatric:        Behavior: Behavior normal.      ED Treatments / Results  Labs (all labs ordered are listed, but only abnormal results are displayed) Labs Reviewed  CBC WITH DIFFERENTIAL/PLATELET - Abnormal; Notable for the following components:      Result Value   RBC 4.08 (*)    MCV 101.5 (*)    RDW 16.4 (*)    nRBC 0.3 (*)    All other components within normal limits  BASIC METABOLIC PANEL - Abnormal; Notable for the following components:   Sodium 131 (*)    Chloride 90 (*)    CO2 19 (*)    Glucose, Bld 139 (*)    BUN 25 (*)    Creatinine, Ser 1.64 (*)    GFR calc non Af Amer 43 (*)    GFR calc Af Amer 49 (*)    Anion gap >20 (*)    All other components within normal limits  ETHANOL  RAPID URINE DRUG SCREEN, HOSP PERFORMED    EKG None  Radiology Ct Head Wo Contrast  Result Date: 01/30/2019 CLINICAL DATA:  Per ER note: got hot and shaky". EMS witnessed apporx 10 sec grand mal And was post ictal. Denies hx of seizures. History of HTN. Pt stated he falls all the time but no fall today or LOC. EXAM: CT  HEAD WITHOUT CONTRAST TECHNIQUE: Contiguous axial images were obtained from the base of the skull through the vertex without intravenous contrast. COMPARISON:  None. FINDINGS: Brain: No evidence of acute infarction, hemorrhage, hydrocephalus, extra-axial collection or mass lesion/mass effect. There is ventricular and sulcal enlargement reflecting mild diffuse atrophy. Mild areas  of white matter hypoattenuation are also noted consistent with chronic microvascular ischemic change. Vascular: No hyperdense vessel or unexpected calcification. Skull: Normal. Negative for fracture or focal lesion. Sinuses/Orbits: Visualized globes and orbits are unremarkable. Mild mucosal thickening along the floor of the left frontal sinus. Visualized sinuses otherwise clear. Other: None. IMPRESSION: 1. No acute intracranial abnormalities. 2. Mild diffuse atrophy. Mild chronic microvascular ischemic change. Electronically Signed   By: Lajean Manes M.D.   On: 01/30/2019 17:43    Procedures Procedures (including critical care time)  Medications Ordered in ED Medications - No data to display   Initial Impression / Assessment and Plan / ED Course  I have reviewed the triage vital signs and the nursing notes.  Pertinent labs & imaging results that were available during my care of the patient were reviewed by me and considered in my medical decision making (see chart for details).        Patient is allegedly status post seizure recently.  He appears neurologically intact at this time.  Will obtain CT head and basic labs along with alcohol and drug screen.   Patient rechecked prior to discharge.  No evidence of postictal behavior in the emergency department.  CT head negative for acute findings.  Creatinine minimally elevated (1.64).  This was discussed with the patient.  He will follow-up with primary care. Final Clinical Impressions(s) / ED Diagnoses   Final diagnoses:  Seizure ([place])  AKI (acute kidney injury) [facility])     ED Discharge Orders    None       Nat Christen, MD 01/30/19 1630    Nat Christen, MD 01/30/19 [phone]

## 2019-01-30 NOTE — ED Triage Notes (Signed)
EMS reports that pt had been outside , "got hot and shaky". EMS witnessed apporx 10 sec grand mal  And was post ictal. Refused IV on scene. Denies hx of seizures. BP 99/74

## 2019-01-30 NOTE — ED Notes (Signed)
Pt was informed that we need a urine sample. Pt was given a urinal. 

## 2019-01-30 NOTE — Discharge Instructions (Addendum)
CT scan of your head was normal.  Creatinine (which is a measure of kidney function was slightly elevated).  This will need to be rechecked by her primary care doctor.  Name of the primary clinic in [place] given for you to follow-up

## 2019-02-05 ENCOUNTER — Telehealth: Payer: Self-pay | Admitting: Gastroenterology

## 2019-02-05 NOTE — Telephone Encounter (Signed)
A lady from [facility] called about a referral SF had ordered. She said that they have called seven (7) times and can not get an answer or a reply from patient. The last call was made this morning.

## 2019-02-05 NOTE — Telephone Encounter (Signed)
PLEASE CALL [email or web address]. Cancel referral..

## 2019-02-07 DIAGNOSIS — N179 Acute kidney failure, unspecified: Secondary | ICD-10-CM | POA: Diagnosis not present

## 2019-02-08 DIAGNOSIS — N179 Acute kidney failure, unspecified: Secondary | ICD-10-CM | POA: Diagnosis not present

## 2019-02-22 ENCOUNTER — Encounter: Payer: Self-pay | Admitting: Gastroenterology

## 2019-03-18 ENCOUNTER — Other Ambulatory Visit: Payer: Self-pay | Admitting: Neurology

## 2019-03-19 ENCOUNTER — Encounter: Payer: Self-pay | Admitting: Neurology

## 2019-03-19 ENCOUNTER — Ambulatory Visit: Payer: Medicare HMO | Admitting: Neurology

## 2019-03-19 ENCOUNTER — Other Ambulatory Visit: Payer: Self-pay

## 2019-03-19 VITALS — BP 142/93 | HR 78 | Temp 97.3°F | Ht 73.0 in | Wt 134.0 lb

## 2019-03-19 DIAGNOSIS — Z7141 Alcohol abuse counseling and surveillance of alcoholic: Secondary | ICD-10-CM

## 2019-03-19 DIAGNOSIS — K625 Hemorrhage of anus and rectum: Secondary | ICD-10-CM | POA: Diagnosis not present

## 2019-03-19 DIAGNOSIS — R569 Unspecified convulsions: Secondary | ICD-10-CM

## 2019-03-19 DIAGNOSIS — R627 Adult failure to thrive: Secondary | ICD-10-CM | POA: Diagnosis not present

## 2019-03-19 DIAGNOSIS — F1020 Alcohol dependence, uncomplicated: Secondary | ICD-10-CM | POA: Insufficient documentation

## 2019-03-19 DIAGNOSIS — F1027 Alcohol dependence with alcohol-induced persisting dementia: Secondary | ICD-10-CM

## 2019-03-19 DIAGNOSIS — J439 Emphysema, unspecified: Secondary | ICD-10-CM | POA: Diagnosis not present

## 2019-03-19 DIAGNOSIS — I10 Essential (primary) hypertension: Secondary | ICD-10-CM

## 2019-03-19 DIAGNOSIS — J449 Chronic obstructive pulmonary disease, unspecified: Secondary | ICD-10-CM | POA: Insufficient documentation

## 2019-03-19 DIAGNOSIS — R69 Illness, unspecified: Secondary | ICD-10-CM | POA: Diagnosis not present

## 2019-03-19 MED ORDER — VITAMIN B-1 250 MG PO TABS: 250.0000 mg | ORAL_TABLET | Freq: Every day | ORAL | 5 refills | Status: DC

## 2019-03-19 NOTE — Progress Notes (Signed)
Provider:  Larey Seat, M D  Referring Provider: Caren Macadam, MD Primary Care Physician:  Caren Macadam, MD  Chief Complaint  Patient presents with  . New Patient (Initial Visit)    pt with daughter, rm 34. pt daughter states that he went to ED for sz like activity. was told he had grand mal sz. he has not had hx of SZ and no other incidents since then    HPI:  Scott Jackson is a 68 y.o. male of african - american descent , right handed , is seen here upon referral/ revisit  from Dr. Caren Macadam for an evaluation of a possible seizure.  He has a history of alcohol and tobacco abuse, malnourishment, emphysema and atherosclerosis.   On 01-30-2019 Scott Jackson presented to [facility], after he had collapsed and lost awareness of his surroundings. He had been at the home of a friend, meeting others on the porch- he consumed alcohol . He felt hot and shaky before he passed out. There had been no primary witness account made available but EMS was called and reportedly witnessed some seconds of tonic clonic activity, and he was clammy.  Eyes open but turned up.  Unknown how long he was out. He had consumed alcohol. He had been smoking. He doesn't drink water.  He is on antianxiety and hypertension medication.   No previous seizures.   Scott Jackson is hard to understand, appears jittery. repetitive and tangential- daughter concerned about dementia.  He was dehydrated when in ED-and had hyponatremia, hypochloremia and elevated creatinine and tested negative for opiates, cocaine etc.  Alcohol test negative. He is one of 7 siblings and takes care of his 49 year old mother.   Review of Systems: Out of a complete 14 system review, the patient complains of only the following symptoms, and all other reviewed systems are negative.  Jittery, trembling, dysarthria and balance. Sweating a lot - when not active.    Social History   Socioeconomic History  .  Marital status: Married    Spouse name: Not on file  . Number of children: Not on file  . Years of education: Not on file  . Highest education level: Not on file  Occupational History  . Not on file  Social Needs  . Financial resource strain: Not on file  . Food insecurity    Worry: Not on file    Inability: Not on file  . Transportation needs    Medical: Not on file    Non-medical: Not on file  Tobacco Use  . Smoking status: Current Every Day Smoker    Packs/day: 1.50    Years: 50.00    Pack years: 75.00    Types: Cigarettes  . Smokeless tobacco: Never Used  Substance and Sexual Activity  . Alcohol use: Yes    Comment: 5th of liqur   . Drug use: No  . Sexual activity: Yes  Lifestyle  . Physical activity    Days per week: Not on file    Minutes per session: Not on file  . Stress: Not on file  Relationships  . Social Herbalist on phone: Not on file    Gets together: Not on file    Attends religious service: Not on file    Active member of club or organization: Not on file    Attends meetings of clubs or organizations: Not on file  Relationship status: Not on file  . Intimate partner violence    Fear of current or ex partner: Not on file    Emotionally abused: Not on file    Physically abused: Not on file    Forced sexual activity: Not on file  Other Topics Concern  . Not on file  Social History Narrative   Works at [employer] in [place], [place].   Separated.   [place], hunting.     Family History  Problem Relation Age of Onset  . Hypertension Mother   . Stroke Mother   . Fibromyalgia Daughter   . Lupus Daughter   . Anxiety disorder Daughter   . Post-traumatic stress disorder Daughter   . Asthma Daughter   . Breast cancer Sister   . Breast cancer Sister   . Colon cancer Neg Hx     Past Medical History:  Diagnosis Date  . ETOH abuse   . Hypertension   . Pulmonary nodule   . Tobacco abuse     Past Surgical History:  Procedure  Laterality Date  . ANKLE FRACTURE SURGERY    . APPENDECTOMY    . BIOPSY  01/08/2019   Procedure: BIOPSY;  Surgeon: Danie Binder, MD;  [facility];  Service: Endoscopy;;  gastric  . COLONOSCOPY  01/2015   Dr. Posey Pronto: two 2-59mm rectal polyps removed, hyperplastic  . COLONOSCOPY  2007   Dr. Posey Pronto: three adenomas removed measuring 4, 10, 39mm. diverticulosis  . COLONOSCOPY WITH PROPOFOL N/A 01/08/2019   Procedure: COLONOSCOPY WITH PROPOFOL;  Surgeon: Danie Binder, MD;  Location[facility];  Service: Endoscopy;  Laterality: N/A;  12:15pm  . ESOPHAGOGASTRODUODENOSCOPY (EGD) WITH PROPOFOL N/A 01/08/2019   Procedure: ESOPHAGOGASTRODUODENOSCOPY (EGD) WITH PROPOFOL;  Surgeon: Danie Binder, MD;  [facility];  Service: Endoscopy;  Laterality: N/A;  . intestininal blockage  1970    Current Outpatient Medications  Medication Sig Dispense Refill  . amLODipine (NORVASC) 10 MG tablet Take 10 mg by mouth daily.    Marland Kitchen lisinopril-hydrochlorothiazide (PRINZIDE,ZESTORETIC) 10-12.5 MG tablet Take 1 tablet by mouth every morning. 90 tablet 1  . Multiple Vitamin (MULTIVITAMIN WITH MINERALS) TABS tablet Take 1 tablet by mouth daily.    Marland Kitchen omeprazole (PRILOSEC) 20 MG capsule 1 PO 30 MINS PRIOR TO BREAKFAST. (Patient taking differently: Take 20 mg by mouth daily before breakfast. 1 PO 30 MINS PRIOR TO BREAKFAST.) 90 capsule 3  . PARoxetine (PAXIL) 10 MG tablet Take 10 mg by mouth every morning.     . vitamin B-12 (CYANOCOBALAMIN) 1000 MCG tablet Take 1,000 mcg by mouth daily.     No current facility-administered medications for this visit.     Allergies as of 03/19/2019  . (No Known Allergies)    Order: [id number]  Status: Final result Visible to patient: No (not released) Next appt: 05/13/2019 at 10:00 AM in Gastroenterology Neil Crouch, PA-C)    Ref Range & Units 5mo ago   Alcohol, Ethyl (B) <10 mg/dL <10   Comment: (NOTE)  Lowest detectable limit for serum alcohol is 10  mg/dL.  For medical purposes only.  Performed at [facility], [address], [place], [place] [address]    Resulting Agency  [facility] CLIN LAB     Specimen Collected: 01/30/19 16:42 Last Resulted: 01/30/19 17:33  Lab Flowsheet  Order Details  View Encounter  Lab and Collection Details  Routing  Result History     Other Results from 01/30/2019 CBC with Differential Order:  [id number]   Status: Final result Visible to patient: No (not released) Next appt: 05/13/2019 at 10:00 AM in Gastroenterology Neil Crouch, PA-C)    Ref Range & Units 34mo ago  35mo ago   WBC 4.0 - 10.5 K/uL 7.0  3.3  RBC 4.22 - 5.81 MIL/uL 4.08 3.50  Hemoglobin 13.0 - 17.0 g/dL 13.5  11.4  HCT 39.0 - 52.0 % 41.4  33.6  MCV 80.0 - 100.0 fL 101.5 96.0   MCH 26.0 - 34.0 pg 33.1  32.6   MCHC 30.0 - 36.0 g/dL 32.6  33.9   RDW 11.5 - 15.5 % 16.4 17.5  Platelets 150 - 400 K/uL 220  170   nRBC 0.0 - 0.2 % 0.3 0.6  Neutrophils Relative % % 80  32   Neutro Abs 1.7 - 7.7 K/uL 5.5  1.1  Lymphocytes Relative % 13  56   Lymphs Abs 0.7 - 4.0 K/uL 0.9  1.9   Monocytes Relative % 7  12   Monocytes Absolute 0.1 - 1.0 K/uL 0.5  0.4   Eosinophils Relative % 0  0   Eosinophils Absolute 0.0 - 0.5 K/uL 0.0  0.0   Basophils Relative % 0  0   Basophils Absolute 0.0 - 0.1 K/uL 0.0  0.0   Immature Granulocytes % 0  0   Abs Immature Granulocytes 0.00 - 0.07 K/uL 0.03  0.00 CM   Comment: Performed at [facility], [address] [place], [place] [address]  Resulting Agency  [facility] CLIN LAB [facility] CLIN LAB     Specimen Collected: 01/30/19 16:42 Last Resulted: 01/30/19 17:28  Lab Flowsheet  Order Details  View Encounter  Lab and Collection Details  Routing  Result History   CM=Additional comments       Basic metabolic panel Order: [id number]   Status: Final result Visible to patient: No (not released) Next appt: 05/13/2019 at 10:00 AM in Gastroenterology Neil Crouch, PA-C)    Ref Range & Units 17mo ago  6mo ago   Sodium 135 - 145  mmol/L 131 141   Potassium 3.5 - 5.1 mmol/L 3.6  3.4  Chloride 98 - 111 mmol/L 90 99   CO2 22 - 32 mmol/L 19 22   Glucose, Bld 70 - 99 mg/dL 139 109  BUN 8 - 23 mg/dL 25 9   Creatinine, Ser 0.61 - 1.24 mg/dL 1.64 0.78   Calcium 8.9 - 10.3 mg/dL 9.3  9.8   GFR calc non Af Amer >60 mL/min 43 60 mL/min" class="[id number]">60   GFR calc Af Amer >60 mL/min 49 60 mL/min" class="[id number]">60   Anion gap 5 - 15 >20 20CM   Comment: Performed at [facility], [address], [place], [place] [address]  Resulting Agency  [facility] CLIN LAB [facility] CLIN LAB     Specimen Collected: 01/30/19 16:42 Last Resulted: 01/30/19 17:53  Lab Flowsheet  Order Details  View Encounter  Lab and Collection Details  Routing  Result History   CM=Additional comments       Urine rapid drug screen (hosp performed) Order: [id number]   Status: Final result Visible to patient: No (not released) Next appt: 05/13/2019 at 10:00 AM in Gastroenterology Neil Crouch, PA-C)    Ref Range & Units 65mo ago  67yr ago   Opiates NONE DETECTED NONE DETECTED  NEGATIVE R   Cocaine NONE DETECTED NONE DETECTED    Benzodiazepines NONE DETECTED NONE DETECTED  NEGATIVE R   Amphetamines NONE DETECTED NONE DETECTED  Tetrahydrocannabinol NONE DETECTED NONE DETECTED    Barbiturates NONE DETECTED NONE DETECTED  NEGATIVE R   Comment: (NOTE)  DRUG SCREEN FOR MEDICAL PURPOSES  ONLY. IF CONFIRMATION IS NEEDED  FOR ANY PURPOSE, NOTIFY LAB  WITHIN 5 DAYS.  LOWEST DETECTABLE LIMITS  FOR URINE DRUG SCREEN  Drug Class Cutoff (ng/mL)  Amphetamine and metabolites 1000  Barbiturate and metabolites 200  Benzodiazepine [id number]  Tricyclics and metabolites 300  Opiates and metabolites 300  Cocaine and metabolites 300  THC 50  Performed at [facility], [address], [place], [place] [address]    Resulting Agency  [facility] CLIN LAB [facility]     Specimen Collected: 01/30/19 16:28 Last Resulted: 01/30/19 19:00  Lab Flowsheet  Order Details  View  Encounter  Lab and Collection Details  Routing  Result History   R=Reference range differs from displayed range       Vitals: BP (!) 142/93   Pulse 78   Temp (!) 97.3 F (36.3 C)   Ht 6\' 1"  (1.854 m)   Wt 134 lb (60.8 kg)   BMI 17.68 kg/m  Last Weight:  Wt Readings from Last 1 Encounters:  03/19/19 134 lb (60.8 kg)   Last Height:   Ht Readings from Last 1 Encounters:  03/19/19 6\' 1"  (1.854 m)    Physical exam:  General: The patient is awake, alert and appears not in acute distress. The patient is groomed but malnourished. . Head: Normocephalic, atraumatic.  Neck is supple. Mallampati3, neck circumference:14.5 " Cardiovascular:  Regular rate and rhythm, without  murmurs or carotid bruit, and without distended neck veins. Respiratory: Lungs are clear to auscultation. Skin:  Without evidence of edema, or rash Trunk: BMI is too low- under weight.    Neurologic exam : The patient is awake and alert, oriented to place and time.  Memory subjective described as "Ok". There is no normal attention span & concentration ability. Speech is fluent with dysarthria, dysphonia and repetitive , aphasia. Mood and affect are aloof.  Cranial nerves: Pupils are equal and briskly reactive to light. Funduscopic exam deferred.  Extraocular movements  in vertical and horizontal planes intact and without nystagmus.  Visual fields by finger perimetry are intact. Hearing to finger rub intact.  Facial sensation intact to fine touch. Facial motor strength is symmetric and tongue and uvula move midline. Tongue protrusion into either cheek is normal.  Shoulder shrug is normal.   Motor exam:   Normal tone ,but atrophied muscle bulk and symmetric strength in upper extremities. He has a droopy shoulder and he limps with the right leg.  Sensory:  Fine touch, pinprick and vibration were tested in all extremities- he was cooperative but he reports no numbness.  Proprioception was normal.  Coordination:  He  was well able to draw a archimedes spiral, but he can't get a shirt buttoned, or a coin out of a pocket.  Rapid alternating movements in the fingers/hands were normal. Finger-to-nose maneuver  normal without evidence of ataxia, dysmetria or tremor.  Gait and station: Patient walks without assistive device and is able unassisted to climb up to the exam table. Strength within normal limits. Stance is unstable and he limps on the right, turned with 6 steps.   Tandem gait is severely fragmented. Romberg testing is positive  Deep tendon reflexes: in the  upper and lower extremities are symmetric and intact.  Babinski maneuver response is downgoing.   Assessment:  After physical and neurologic examination, review of laboratory studies, imaging,  neurophysiology testing and pre-existing records, assessment is that of :   I doubt sudden onset of epilepsy- there were so many co-morbidities and cofactors present- DEHYDRATION, admittedly had been drinking a large shot of Vodka. He had severe rise in creatinine and has no muscle mass to speak off.   he has emphysema and hardening of the arteries.    Plan:  Treatment plan and additional workup :  EEG at [facility] Encourage alcohol treatment , [facility].  HYDRATION with water, Gatorade, and increased protein intake.    Asencion Partridge Alizeh Madril MD 03/19/2019

## 2019-03-19 NOTE — Patient Instructions (Signed)

## 2019-03-21 ENCOUNTER — Ambulatory Visit (INDEPENDENT_AMBULATORY_CARE_PROVIDER_SITE_OTHER): Payer: Medicare HMO | Admitting: Neurology

## 2019-03-21 ENCOUNTER — Other Ambulatory Visit: Payer: Self-pay

## 2019-03-21 DIAGNOSIS — R569 Unspecified convulsions: Secondary | ICD-10-CM

## 2019-03-21 DIAGNOSIS — J439 Emphysema, unspecified: Secondary | ICD-10-CM

## 2019-03-21 DIAGNOSIS — Z7141 Alcohol abuse counseling and surveillance of alcoholic: Secondary | ICD-10-CM

## 2019-03-21 DIAGNOSIS — R627 Adult failure to thrive: Secondary | ICD-10-CM

## 2019-03-21 DIAGNOSIS — K625 Hemorrhage of anus and rectum: Secondary | ICD-10-CM

## 2019-03-21 DIAGNOSIS — I10 Essential (primary) hypertension: Secondary | ICD-10-CM

## 2019-04-09 ENCOUNTER — Telehealth: Payer: Self-pay | Admitting: Neurology

## 2019-04-09 DIAGNOSIS — R627 Adult failure to thrive: Secondary | ICD-10-CM | POA: Insufficient documentation

## 2019-04-09 DIAGNOSIS — R569 Unspecified convulsions: Secondary | ICD-10-CM | POA: Insufficient documentation

## 2019-04-09 NOTE — Procedures (Signed)
EEG report GURDON SCHWOERER, 03-21-2019  Mr. Orvid Baratta is a 68 year old African-American gentleman with a history of alcohol abuse, malnourishment, COPD, diffuse atherosclerosis and peripheral artery disease who also had a possible seizure.  This is a 20 electrode EEG was a single channel dedicated to limited EKG recording.  The electrodes are placed in accordance with the international 10-20 system.  Data were reviewed in the raw format and in different montages.  Photic stimulation and hyperventilation are performed as stimulation maneuvers.  This recording begins with the patient in awake state the background is symmetric and not slowed.  A 9 Hz posterior dominant background was established during eye closure.  Notable is a right-sided temporal frontal elevation of amplitude in the T4 F4 of a channels.  This does not find a symmetric development on the left, and during hyperventilation there is phase- reversal noted over this area.  Photic stimulation begun, the background amplitude ranges between 20 and 60 V the patient appeared drowsy.  Sleep spindles are briefly noted.  From there photic stimulation proceeded and there is no evidence of photic entrainment at any of the tested frequencies.  Continued F4 and T4 phase reversal activity is occasional temporal sharp waves with higher amplitude than the rest of the EEG were noted.  Conclusion this is an abnormal EEG and yes there is a higher risk for this patient to actually suffer seizures given that he has right frontotemporal abnormal discharges, these can also be seen in so-called breach artifacts if the skull had been penetrated by trauma or in a surgical procedure for example.  Given the history of alcohol induced dementia and vascular disease I suspect that this is more likely a finding related to an ischemic infarct.

## 2019-04-09 NOTE — Telephone Encounter (Signed)
Called the patient's daughter to review the EEG results. There was no answer. LVM (per DPR) for them to call back so that we can review the results.   If patient's daughter calls back please advise the EEG was abnormal. Dr Dohmeier recommends the patient avoid alcohol as the alcohol can lower seizure threshold. Please schedule the patient with follow up with NP, as I did not see a follow up on file, and contact us if he has any more events.

## 2019-04-09 NOTE — Telephone Encounter (Signed)
-----   Message from Larey Seat, MD sent at 04/09/2019 12:41 PM EDT ----- Abnormal EEG with high amplitude activity over T4/ F8 area, right brain, fronto temporal. No epileptiform activity noted, but asymmetric EEG is abnormal. Main goal is alcohol cessation as alcohol lowers the seizure threshold. Medication may not be needed, given that this was a single event.

## 2019-04-25 ENCOUNTER — Encounter: Payer: Self-pay | Admitting: Neurology

## 2019-05-08 ENCOUNTER — Telehealth: Payer: Self-pay | Admitting: Gastroenterology

## 2019-05-08 NOTE — Telephone Encounter (Signed)
Called daughter [name]) and gave her phone number to [facility].

## 2019-05-08 NOTE — Telephone Encounter (Signed)
[phone] patient needs phone number to the medical facility in [place] we referred him to

## 2019-05-10 ENCOUNTER — Ambulatory Visit: Payer: Medicare HMO | Admitting: Gastroenterology

## 2019-05-13 ENCOUNTER — Ambulatory Visit: Payer: Medicare HMO | Admitting: Gastroenterology

## 2019-05-13 ENCOUNTER — Encounter: Payer: Self-pay | Admitting: Gastroenterology

## 2019-05-13 ENCOUNTER — Other Ambulatory Visit: Payer: Self-pay

## 2019-05-13 VITALS — BP 114/74 | HR 93 | Temp 96.6°F | Ht 73.0 in | Wt 134.4 lb

## 2019-05-13 DIAGNOSIS — R933 Abnormal findings on diagnostic imaging of other parts of digestive tract: Secondary | ICD-10-CM | POA: Diagnosis not present

## 2019-05-13 DIAGNOSIS — R634 Abnormal weight loss: Secondary | ICD-10-CM | POA: Diagnosis not present

## 2019-05-13 NOTE — Patient Instructions (Signed)
1. Please make sure you follow-up at [facility] to schedule colonoscopy. 2. We will await to hear those results.  We will plan to see you back here in 4 months. 3. If you have any further weight loss, worsening appetite, any other concerns, please call in the interim. 4. Consider making a follow-up appointment with Dr. Mannie Stabile.

## 2019-05-13 NOTE — Progress Notes (Signed)
Primary Care Physician: Caren Macadam, MD  Primary Gastroenterologist:  Barney Drain, MD   Chief Complaint  Patient presents with  . Follow-up    FU hemorrhoids,Wt loss,loss of appetite,nausea    HPI: Scott Jackson is a 68 y.o. male here for follow-up.  States he has had some wt loss, nausea, appetite concerns since we last saw him.  He was seen in the office back in March 2020 with complaints of hemorrhoids, needing colonoscopy.  Having rectal pain with rectal bleeding at that time.  Also with known history of alcohol abuse, vodka daily.  Began drinking significantly after his father was murdered in the 24s.  Able to hold down jobs, does not drive.  Not interested in stopping alcohol.    Back in June he had a EGD and colonoscopy.  On EGD he had a low-grade narrowing Schatzki ring, medium sized hiatal hernia, erosive gastritis/duodenitis felt to be related to aspirin, Mobic, alcohol.  He was advised to cut back on alcohol use, start omeprazole.  Initially was started on twice daily for 1 month then once daily.  At time of colonoscopy the scope was advanced to the sigmoid colon but could not be advanced any further, extremely difficult due to restricted mobility of the colon.  He had multiple small and large mouth diverticula in the rectosigmoid colon and sigmoid colon, internal hemorrhoids felt to be moderate.  CT pelvis without contrast January 18, 2019 due to incomplete colonoscopy showed scattered colonic diverticulosis, possible mild wall thickening of the cecum and ascending colon, possibly due to limited distention.  Dr. Oneida Alar recommended him have a complete colonoscopy at [facility] in 3 months.  Patient states that he had an appointment last Friday at [facility] that his daughter had to cancel the appointment due to being unable to find childcare for herself.  She is rescheduling appointment, waiting for a new one.    His weight is down about 10 pounds since June.  However overall  stable since July. Loses appetite with smell of food. Makes sick to stomach. No vomiting.  After food is prepared he has to wait a while and eventually his appetite returns and he is able to eat.  Some days good appetite, can eat fine. Eating small amounts at a time.  No vomiting.  No heartburn.  Bowel movements are regular.  No blood in stool or black stools.   If he stands up too quickly he feels a little nauseated.  He sits down until it goes away.  States he has not seen his PCP in a while, no current appointment scheduled.  He reports that he is cut back on his alcohol use.  Can go up to a week at a time without drinking.  Also does not drink his regularly.  Patient states he prefers a bowel prep that does not involve an enema.  With further discussion with the patient, it appears that he did not take the TriLyte portion of the prep.  He just took Dulcolax and enemas.   Current Outpatient Medications  Medication Sig Dispense Refill  . amLODipine (NORVASC) 10 MG tablet Take 10 mg by mouth daily.    Marland Kitchen lisinopril-hydrochlorothiazide (PRINZIDE,ZESTORETIC) 10-12.5 MG tablet Take 1 tablet by mouth every morning. 90 tablet 1  . Multiple Vitamin (MULTIVITAMIN WITH MINERALS) TABS tablet Take 1 tablet by mouth daily.    Marland Kitchen omeprazole (PRILOSEC) 20 MG capsule 1 PO 30 MINS PRIOR TO BREAKFAST. (Patient taking differently: Take 20 mg  by mouth daily before breakfast. 1 PO 30 MINS PRIOR TO BREAKFAST.) 90 capsule 3  . PARoxetine (PAXIL) 10 MG tablet Take 10 mg by mouth every morning.     . Thiamine HCl (VITAMIN B-1) 250 MG tablet Take 1 tablet (250 mg total) by mouth daily. 30 tablet 5  . vitamin B-12 (CYANOCOBALAMIN) 1000 MCG tablet Take 1,000 mcg by mouth daily.     No current facility-administered medications for this visit.     Allergies as of 05/13/2019  . (No Known Allergies)    ROS:  General: Negative for fever, chills, fatigue, weakness.  See HPI ENT: Negative for hoarseness, difficulty  swallowing , nasal congestion. CV: Negative for chest pain, angina, palpitations, dyspnea on exertion, peripheral edema.  Respiratory: Negative for dyspnea at rest, dyspnea on exertion, cough, sputum, wheezing.  GI: See history of present illness. GU:  Negative for dysuria, hematuria, urinary incontinence, urinary frequency, nocturnal urination.  Endo: Negative for unusual weight change.    Physical Examination:   BP 114/74   Pulse 93   Temp (!) 96.6 F (35.9 C)   Ht 6\' 1"  (1.854 m)   Wt 134 lb 6.4 oz (61 kg)   BMI 17.73 kg/m   General: Well-nourished, well-developed in no acute distress.  Eyes: No icterus. Mouth: Oropharyngeal mucosa moist and pink , no lesions erythema or exudate. Lungs: Clear to auscultation bilaterally.  Heart: Regular rate and rhythm, no murmurs rubs or gallops.  Abdomen: Bowel sounds are normal, nontender, nondistended, no hepatosplenomegaly or masses, no abdominal bruits or hernia , no rebound or guarding.   Extremities: No lower extremity edema. No clubbing or deformities. Neuro: Alert and oriented x 4   Skin: Warm and dry, no jaundice.   Psych: Alert and cooperative, normal mood and affect.  Labs:  Lab Results  Component Value Date   CREATININE 1.64 (H) 01/30/2019   BUN 25 (H) 01/30/2019   NA 131 (L) 01/30/2019   K 3.6 01/30/2019   CL 90 (L) 01/30/2019   CO2 19 (L) 01/30/2019   Lab Results  Component Value Date   ALT 14 11/23/2017   AST 27 11/23/2017   BILITOT 0.3 11/23/2017   Lab Results  Component Value Date   WBC 7.0 01/30/2019   HGB 13.5 01/30/2019   HCT 41.4 01/30/2019   MCV 101.5 (H) 01/30/2019   PLT 220 01/30/2019    Imaging Studies: No results found.

## 2019-05-14 NOTE — Progress Notes (Signed)
CC'ED TO PCP 

## 2019-05-14 NOTE — Assessment & Plan Note (Signed)
Incomplete colonoscopy as described above.  CT pelvis with questionable wall thickening of the cecum and ascending colon.  He has been advised to complete colonoscopy at [facility].  Unfortunately evaluation has been postponed on their part.  Encourage patient to follow through with appointment.  We will plan to see him back in 4 months.  He is to call with any questions or concerns, further weight loss in the interim.

## 2019-05-14 NOTE — Assessment & Plan Note (Addendum)
10 pound weight loss over the summer but weight has been fairly stable since July.  Patient has some loss of appetite and nausea with the smell of food but is able to eat.  No vomiting. No abdominal pain. Continues to drink etoh although he reports he cut way back. Continue omeprazole 20mg  daily. Continue to reduce etoh. Follow up with appointment at [facility] to get colonoscopy complete. Return to the office in four months.   Of note, I offered labs to evaluate his nausea, loss of weight and dizziness but patient declined. Encouraged him to follow up with PCP.

## 2019-05-31 DIAGNOSIS — Z1211 Encounter for screening for malignant neoplasm of colon: Secondary | ICD-10-CM | POA: Diagnosis not present

## 2019-05-31 DIAGNOSIS — R69 Illness, unspecified: Secondary | ICD-10-CM | POA: Diagnosis not present

## 2019-05-31 DIAGNOSIS — R634 Abnormal weight loss: Secondary | ICD-10-CM | POA: Diagnosis not present

## 2019-05-31 DIAGNOSIS — K921 Melena: Secondary | ICD-10-CM | POA: Diagnosis not present

## 2019-05-31 DIAGNOSIS — D649 Anemia, unspecified: Secondary | ICD-10-CM | POA: Diagnosis not present

## 2019-05-31 DIAGNOSIS — Z122 Encounter for screening for malignant neoplasm of respiratory organs: Secondary | ICD-10-CM | POA: Diagnosis not present

## 2019-05-31 DIAGNOSIS — E538 Deficiency of other specified B group vitamins: Secondary | ICD-10-CM | POA: Diagnosis not present

## 2019-05-31 DIAGNOSIS — I10 Essential (primary) hypertension: Secondary | ICD-10-CM | POA: Diagnosis not present

## 2019-05-31 DIAGNOSIS — R9389 Abnormal findings on diagnostic imaging of other specified body structures: Secondary | ICD-10-CM | POA: Diagnosis not present

## 2019-05-31 DIAGNOSIS — I700 Atherosclerosis of aorta: Secondary | ICD-10-CM | POA: Diagnosis not present

## 2019-06-07 DIAGNOSIS — K2970 Gastritis, unspecified, without bleeding: Secondary | ICD-10-CM | POA: Diagnosis not present

## 2019-06-07 DIAGNOSIS — R69 Illness, unspecified: Secondary | ICD-10-CM | POA: Diagnosis not present

## 2019-06-07 DIAGNOSIS — K2980 Duodenitis without bleeding: Secondary | ICD-10-CM | POA: Diagnosis not present

## 2019-06-07 DIAGNOSIS — Z539 Procedure and treatment not carried out, unspecified reason: Secondary | ICD-10-CM | POA: Diagnosis not present

## 2019-06-07 DIAGNOSIS — R933 Abnormal findings on diagnostic imaging of other parts of digestive tract: Secondary | ICD-10-CM | POA: Diagnosis not present

## 2019-06-07 DIAGNOSIS — R634 Abnormal weight loss: Secondary | ICD-10-CM | POA: Diagnosis not present

## 2019-06-07 DIAGNOSIS — K625 Hemorrhage of anus and rectum: Secondary | ICD-10-CM | POA: Diagnosis not present

## 2019-06-07 DIAGNOSIS — K5900 Constipation, unspecified: Secondary | ICD-10-CM | POA: Diagnosis not present

## 2019-06-07 DIAGNOSIS — R627 Adult failure to thrive: Secondary | ICD-10-CM | POA: Diagnosis not present

## 2019-06-27 ENCOUNTER — Ambulatory Visit (HOSPITAL_COMMUNITY): Payer: Medicare HMO

## 2019-06-27 ENCOUNTER — Encounter (HOSPITAL_COMMUNITY): Payer: Self-pay

## 2019-07-17 ENCOUNTER — Other Ambulatory Visit: Payer: Self-pay | Admitting: Family Medicine

## 2019-07-17 ENCOUNTER — Other Ambulatory Visit (HOSPITAL_COMMUNITY): Payer: Self-pay | Admitting: Family Medicine

## 2019-07-17 DIAGNOSIS — Z122 Encounter for screening for malignant neoplasm of respiratory organs: Secondary | ICD-10-CM

## 2019-08-13 ENCOUNTER — Encounter (HOSPITAL_COMMUNITY): Payer: Self-pay

## 2019-08-13 ENCOUNTER — Ambulatory Visit (HOSPITAL_COMMUNITY): Admission: RE | Admit: 2019-08-13 | Payer: Medicare HMO | Source: Ambulatory Visit

## 2019-08-21 NOTE — Progress Notes (Signed)
Cardiology Office Note   Date:  08/24/2019   ID:  Scott Jackson, Scott Jackson Nov 23, 1950, MRN [id number]  PCP:  Scott Macadam, MD  Cardiologist:  Dr. Johnsie Jackson     Chief Complaint  Patient presents with  . Loss of Consciousness      History of Present Illness: Scott Jackson is a 69 y.o. male who presents for syncope  Last seen by Dr. Johnsie Jackson 01/11/18   He is an alcoholic and has refused inpatient Rx. He had a lung cancer screening scan that noted calcium in his LAD. He has no cardiac history I reviewed scan from 12/03/17  And he had a small amount of calcium in the proximal to mid LAD and none in the RCA or circumflex. He has recently been more compliant with his BP meds. Still does not exercise and has poor diet Dr Scott Jackson felt he should have a stress test Still smoking at least a ppd Screening CT also showed moderate emphysema and a part solid cycstic lesion in the medial left lower lobe measuring 8.6 mm Recommendation for f/u CT in 6 months   Prior visit he was drinking vodka 3 shots ors so at night Does not drink during day Smokes about 1 ppd.   ETT  11/2017 low risk with hypertensive response to exercise.  F/u  CT in 05/2018  Benign appearance + emphysema, aortic atherosclerosis   Today he has had several episodes of syncope.  This has been occurring over the last year.  His daughter brought him in for eval.  No chest pain or SOB.  He has had several episodes, usually when sitting at times with standing.  No injuries.  He is down to 1/2 ppd and 3 drinks per week.  His diet is poor, no appetite, bu the has been drinking boost.  His wt is the same from 01/11/18.  No lightheadedness just out.  Today  No orthostatic BP though HR does increase with standing.  From 84 at rest to 113 with standing.  No symptoms today with orthostatic BP checks.  He does eat chicken noodle soup, so his salt intake is adequate.  We discussed support stockings to help prevent any drops in BP.   no awareness or heart beat  or rapid heart beats.     Past Medical History:  Diagnosis Date  . ETOH abuse   . Hypertension   . Pulmonary nodule   . Tobacco abuse     Past Surgical History:  Procedure Laterality Date  . ANKLE FRACTURE SURGERY    . APPENDECTOMY    . BIOPSY  01/08/2019   Procedure: BIOPSY;  Surgeon: Scott Binder, MD;  Location: [facility];  Service: Endoscopy;;  gastric  . COLONOSCOPY  01/2015   Dr. Posey Jackson: two 2-54mm rectal polyps removed, hyperplastic  . COLONOSCOPY  2007   Dr. Posey Jackson: three adenomas removed measuring 4, 10, 54mm. diverticulosis  . COLONOSCOPY WITH PROPOFOL N/A 01/08/2019   Procedure: COLONOSCOPY WITH PROPOFOL;  Surgeon: Scott Binder, MD;  Location: [facility];  Service: Endoscopy;  Laterality: N/A;  12:15pm  . ESOPHAGOGASTRODUODENOSCOPY (EGD) WITH PROPOFOL N/A 01/08/2019   Procedure: ESOPHAGOGASTRODUODENOSCOPY (EGD) WITH PROPOFOL;  Surgeon: Scott Binder, MD;  Location: [facility];  Service: Endoscopy;  Laterality: N/A;  . intestininal blockage  1970     Current Outpatient Medications  Medication Sig Dispense Refill  . amLODipine (NORVASC) 10 MG tablet Take 10 mg by mouth daily.    Marland Kitchen lisinopril-hydrochlorothiazide (  PRINZIDE,ZESTORETIC) 10-12.5 MG tablet Take 1 tablet by mouth every morning. 90 tablet 1  . Multiple Vitamin (MULTIVITAMIN WITH MINERALS) TABS tablet Take 1 tablet by mouth daily.    Marland Kitchen PARoxetine (PAXIL) 10 MG tablet Take 10 mg by mouth every morning.     . Thiamine HCl (VITAMIN B-1) 250 MG tablet Take 1 tablet (250 mg total) by mouth daily. 30 tablet 5  . vitamin B-12 (CYANOCOBALAMIN) 1000 MCG tablet Take 1,000 mcg by mouth daily.     No current facility-administered medications for this visit.    Allergies:   Patient has no known allergies.    Social History:  The patient  reports that he has been smoking cigarettes. He has a 12.50 pack-year smoking history. He has never used smokeless tobacco. He reports current alcohol use. He reports that he  does not use drugs.   Family History:  The patient's family history includes Anxiety disorder in his daughter; Asthma in his daughter; Breast cancer in his sister and sister; Fibromyalgia in his daughter; Hypertension in his mother; Lupus in his daughter; Post-traumatic stress disorder in his daughter; Stroke in his mother.    ROS:  General:no colds or fevers, no weight changes Skin:no rashes or ulcers HEENT:no blurred vision, no congestion CV:see HPI PUL:see HPI GI:no diarrhea constipation or melena, no indigestion GU:no hematuria, no dysuria MS:no joint pain, no claudication Neuro:no syncope, no lightheadedness Endo:no diabetes, no thyroid disease  Wt Readings from Last 3 Encounters:  08/22/19 140 lb (63.5 kg)  05/13/19 134 lb 6.4 oz (61 kg)  03/19/19 134 lb (60.8 kg)     PHYSICAL EXAM: VS:  Pulse 98   Ht 6\' 1"  (1.854 m)   Wt 140 lb (63.5 kg)   SpO2 97%   BMI 18.47 kg/m  , BMI Body mass index is 18.47 kg/m. General:Pleasant affect, NAD Skin:Warm and dry, brisk capillary refill HEENT:normocephalic, sclera clear, mucus membranes moist Neck:supple, no JVD, no bruits  Heart:S1S2 RRR without murmur, gallup, rub or click Lungs:clear without rales, rhonchi, or wheezes [id number], non tender, + BS, do not palpate liver spleen or masses Ext:no lower ext edema, 2+ pedal pulses, 2+ radial pulses Neuro:alert and oriented X 3, MAE, follows commands, + facial symmetry    EKG:  EKG is ordered today. The ekg ordered today demonstrates SR no changes from 01/11/18   Recent Labs: 01/30/2019: BUN 25; Creatinine, Ser 1.64; Hemoglobin 13.5; Platelets 220; Potassium 3.6; Sodium 131    Lipid Panel    Component Value Date/Time   CHOL 207 (H) 11/23/2017 1225   TRIG 139 11/23/2017 1225   HDL 97 11/23/2017 1225   CHOLHDL 2.1 11/23/2017 1225   LDLCALC 86 11/23/2017 1225    labs 05/31/2019 HDL 129, LDL 55, TG 179 hgb 12.9 K+4.0 TSH 0.410    Other studies Reviewed: Additional studies/  records that were reviewed today include:  ETT 01/18/18  Normal ETT  ASSESSMENT AND PLAN:  1.  Syncope several over a course of a year, more frequent..  No injuries, possible arrhythmia that may cause sudden loss of consciousness.  Will eval echo for cardiac structure. And 14 day event monitor.   He will return for resolute in 4-5 weeks.  Though if abnormalities will see earlier.  Labs in Nov were good and he was having episodes then.  Also recommended support hose and more protein in diet.  2. CAD incidental finding on CT and neg ETT in 2019.  If echo abnormal would plan ischemic eval.  3.  COPD and tobacco use, has decreased to half a PPD, continue to decrease, he is trying we discussed.  4.  HTN controlled. Continue meds  5.  ETOH use continue to decrease amount to stopping, discussed.    Current medicines are reviewed with the patient today.  The patient Has no concerns regarding medicines.  The following changes have been made:  See above Labs/ tests ordered today include:see above  Disposition:   FU:  see above  Signed, Cecilie Kicks, NP  08/24/2019 11:25 PM    [facility] [place], [place], [place], [place] Phone: [phone]; Fax: [phone]

## 2019-08-22 ENCOUNTER — Other Ambulatory Visit: Payer: Self-pay

## 2019-08-22 ENCOUNTER — Telehealth: Payer: Self-pay | Admitting: Radiology

## 2019-08-22 ENCOUNTER — Encounter: Payer: Self-pay | Admitting: Cardiology

## 2019-08-22 ENCOUNTER — Ambulatory Visit: Payer: Medicare HMO | Admitting: Cardiology

## 2019-08-22 ENCOUNTER — Encounter (INDEPENDENT_AMBULATORY_CARE_PROVIDER_SITE_OTHER): Payer: Self-pay

## 2019-08-22 VITALS — Ht 73.0 in | Wt 140.0 lb

## 2019-08-22 DIAGNOSIS — I2510 Atherosclerotic heart disease of native coronary artery without angina pectoris: Secondary | ICD-10-CM | POA: Diagnosis not present

## 2019-08-22 DIAGNOSIS — R55 Syncope and collapse: Secondary | ICD-10-CM | POA: Diagnosis not present

## 2019-08-22 DIAGNOSIS — I10 Essential (primary) hypertension: Secondary | ICD-10-CM

## 2019-08-22 DIAGNOSIS — R69 Illness, unspecified: Secondary | ICD-10-CM | POA: Diagnosis not present

## 2019-08-22 DIAGNOSIS — J449 Chronic obstructive pulmonary disease, unspecified: Secondary | ICD-10-CM | POA: Diagnosis not present

## 2019-08-22 DIAGNOSIS — F1020 Alcohol dependence, uncomplicated: Secondary | ICD-10-CM

## 2019-08-22 NOTE — Patient Instructions (Signed)
Medication Instructions:  Your physician recommends that you continue on your current medications as directed. Please refer to the Current Medication list given to you today.  *If you need a refill on your cardiac medications before your next appointment, please call your pharmacy*  Lab Work: None ordered  If you have labs (blood work) drawn today and your tests are completely normal, you will receive your results only by: Marland Kitchen MyChart Message (if you have MyChart) OR . A paper copy in the mail If you have any lab test that is abnormal or we need to change your treatment, we will call you to review the results.  Testing/Procedures: Your physician has requested that you have an echocardiogram 09-03-2019 ARRIVE AT 12:50 FOR REGISTRATION. Echocardiography is a painless test that uses sound waves to create images of your heart. It provides your doctor with information about the size and shape of your heart and how well your heart's chambers and valves are working. This procedure takes approximately one hour. There are no restrictions for this procedure.   ZIO XT- Long Term Monitor Instructions   Your physician has requested you wear your ZIO patch monitor 14 days.   This is a single patch monitor.  [employer] supplies one patch monitor per enrollment.  Additional stickers are not available.   Please do not apply patch if you will be having a Nuclear Stress Test, Echocardiogram, Cardiac CT, MRI, or Chest Xray during the time frame you would be wearing the monitor. The patch cannot be worn during these tests.  You cannot remove and re-apply the ZIO XT patch monitor.   Your ZIO patch monitor will be sent USPS Priority mail from [facility] directly to your home address. The monitor may also be mailed to a PO BOX if home delivery is not available.   It may take 3-5 days to receive your monitor after you have been enrolled.   Once you have received you monitor, please review enclosed instructions.   Your monitor has already been registered assigning a specific monitor serial # to you.   Applying the monitor   Shave hair from upper left chest.   Hold abrader disc by orange tab.  Rub abrader in 40 strokes over left upper chest as indicated in your monitor instructions.   Clean area with 4 enclosed alcohol pads .  Use all pads to assure are is cleaned thoroughly.  Let dry.   Apply patch as indicated in monitor instructions.  Patch will be place under collarbone on left side of chest with arrow pointing upward.   Rub patch adhesive wings for 2 minutes.Remove white label marked "1".  Remove white label marked "2".  Rub patch adhesive wings for 2 additional minutes.   While looking in a mirror, press and release button in center of patch.  A small green light will flash 3-4 times .  This will be your only indicator the monitor has been turned on.     Do not shower for the first 24 hours.  You may shower after the first 24 hours.   Press button if you feel a symptom. You will hear a small click.  Record Date, Time and Symptom in the Patient Log Book.   When you are ready to remove patch, follow instructions on last 2 pages of Patient Log Book.  Stick patch monitor onto last page of Patient Log Book.   Place Patient Log Book in [place] box.  Use locking tab on box and tape  box closed securely.  The [employer] has [employer] on it.  Please place in mailbox as soon as possible.  Your physician should have your test results approximately 7 days after the monitor has been mailed back to [facility].   Call [place] at [phone] if you have questions regarding your ZIO XT patch monitor.  Call them immediately if you see an orange light blinking on your monitor.   If your monitor falls off in less than 4 days contact our Monitor department at [phone].  If your monitor becomes loose or falls off after 4 days call [employer] at [phone] for suggestions  on securing your monitor.    Follow-Up: At [facility], you and your health needs are our priority.  As part of our continuing mission to provide you with exceptional heart care, we have created designated Provider Care Teams.  These Care Teams include your primary Cardiologist (physician) and Advanced Practice Providers (APPs -  Physician Assistants and Nurse Practitioners) who all work together to provide you with the care you need, when you need it.  Your next appointment:   10/01/2019  ARRIVE AT 11:30 FOR REGISTRATION  The format for your next appointment:   In Person  Provider:   Cecilie Kicks, NP  Other Instructions  Echocardiogram An echocardiogram is a procedure that uses painless sound waves (ultrasound) to produce an image of the heart. Images from an echocardiogram can provide important information about:  Signs of coronary artery disease (CAD).  Aneurysm detection. An aneurysm is a weak or damaged part of an artery wall that bulges out from the normal force of blood pumping through the body.  Heart size and shape. Changes in the size or shape of the heart can be associated with certain conditions, including heart failure, aneurysm, and CAD.  Heart muscle function.  Heart valve function.  Signs of a past heart attack.  Fluid buildup around the heart.  Thickening of the heart muscle.  A tumor or infectious growth around the heart valves. Tell a health care provider about:  Any allergies you have.  All medicines you are taking, including vitamins, herbs, eye drops, creams, and over-the-counter medicines.  Any blood disorders you have.  Any surgeries you have had.  Any medical conditions you have.  Whether you are pregnant or may be pregnant. What are the risks? Generally, this is a safe procedure. However, problems may occur, including:  Allergic reaction to dye (contrast) that may be used during the procedure. What happens before the procedure? No  specific preparation is needed. You may eat and drink normally. What happens during the procedure?   An IV tube may be inserted into one of your veins.  You may receive contrast through this tube. A contrast is an injection that improves the quality of the pictures from your heart.  A gel will be applied to your chest.  A wand-like tool (transducer) will be moved over your chest. The gel will help to transmit the sound waves from the transducer.  The sound waves will harmlessly bounce off of your heart to allow the heart images to be captured in real-time motion. The images will be recorded on a computer. The procedure may vary among health care providers and hospitals. What happens after the procedure?  You may return to your normal, everyday life, including diet, activities, and medicines, unless your health care provider tells you not to do that. Summary  An echocardiogram is a procedure that  uses painless sound waves (ultrasound) to produce an image of the heart.  Images from an echocardiogram can provide important information about the size and shape of your heart, heart muscle function, heart valve function, and fluid buildup around your heart.  You do not need to do anything to prepare before this procedure. You may eat and drink normally.  After the echocardiogram is completed, you may return to your normal, everyday life, unless your health care provider tells you not to do that. This information is not intended to replace advice given to you by your health care provider. Make sure you discuss any questions you have with your health care provider. Document Revised: 11/01/2018 Document Reviewed: 08/13/2016 Elsevier Patient Education  [place].

## 2019-08-22 NOTE — Telephone Encounter (Signed)
Enrolled patient for a 14 day Zio monitor to be mailed to patients home.  

## 2019-08-23 ENCOUNTER — Encounter: Payer: Self-pay | Admitting: Cardiology

## 2019-08-24 ENCOUNTER — Encounter: Payer: Self-pay | Admitting: Cardiology

## 2019-08-26 ENCOUNTER — Other Ambulatory Visit: Payer: Self-pay

## 2019-08-26 ENCOUNTER — Ambulatory Visit (HOSPITAL_COMMUNITY)
Admission: RE | Admit: 2019-08-26 | Discharge: 2019-08-26 | Disposition: A | Discharge status: Home / Self Care | Payer: Medicare HMO | Source: Ambulatory Visit | Attending: Family Medicine | Admitting: Family Medicine

## 2019-08-26 DIAGNOSIS — Z122 Encounter for screening for malignant neoplasm of respiratory organs: Secondary | ICD-10-CM | POA: Insufficient documentation

## 2019-08-26 DIAGNOSIS — R69 Illness, unspecified: Secondary | ICD-10-CM | POA: Diagnosis not present

## 2019-08-29 DIAGNOSIS — I10 Essential (primary) hypertension: Secondary | ICD-10-CM | POA: Diagnosis not present

## 2019-08-29 DIAGNOSIS — R55 Syncope and collapse: Secondary | ICD-10-CM | POA: Diagnosis not present

## 2019-08-29 DIAGNOSIS — R279 Unspecified lack of coordination: Secondary | ICD-10-CM | POA: Diagnosis not present

## 2019-08-29 DIAGNOSIS — R5383 Other fatigue: Secondary | ICD-10-CM | POA: Diagnosis not present

## 2019-08-29 DIAGNOSIS — R251 Tremor, unspecified: Secondary | ICD-10-CM | POA: Diagnosis not present

## 2019-08-29 DIAGNOSIS — G969 Disorder of central nervous system, unspecified: Secondary | ICD-10-CM | POA: Diagnosis not present

## 2019-08-29 DIAGNOSIS — Z7289 Other problems related to lifestyle: Secondary | ICD-10-CM | POA: Diagnosis not present

## 2019-09-03 ENCOUNTER — Other Ambulatory Visit (HOSPITAL_COMMUNITY): Payer: Medicare HMO

## 2019-09-13 ENCOUNTER — Ambulatory Visit: Payer: Medicare HMO | Admitting: Gastroenterology

## 2019-09-16 ENCOUNTER — Encounter: Payer: Self-pay | Admitting: Gastroenterology

## 2019-09-16 ENCOUNTER — Telehealth: Payer: Self-pay | Admitting: Gastroenterology

## 2019-09-16 ENCOUNTER — Ambulatory Visit: Payer: Medicare HMO | Admitting: Gastroenterology

## 2019-09-16 NOTE — Telephone Encounter (Signed)
PATIENT WAS A NO SHOW AND LETTER SENT  °

## 2019-09-17 ENCOUNTER — Other Ambulatory Visit (INDEPENDENT_AMBULATORY_CARE_PROVIDER_SITE_OTHER): Payer: Medicare HMO

## 2019-09-17 DIAGNOSIS — R55 Syncope and collapse: Secondary | ICD-10-CM | POA: Diagnosis not present

## 2019-09-17 DIAGNOSIS — I2510 Atherosclerotic heart disease of native coronary artery without angina pectoris: Secondary | ICD-10-CM

## 2019-10-02 ENCOUNTER — Ambulatory Visit: Payer: Medicare HMO | Admitting: Cardiology

## 2019-10-08 DIAGNOSIS — J439 Emphysema, unspecified: Secondary | ICD-10-CM | POA: Diagnosis not present

## 2019-10-08 DIAGNOSIS — I10 Essential (primary) hypertension: Secondary | ICD-10-CM | POA: Diagnosis not present

## 2019-10-10 DIAGNOSIS — R55 Syncope and collapse: Secondary | ICD-10-CM | POA: Diagnosis not present

## 2019-10-16 ENCOUNTER — Ambulatory Visit (INDEPENDENT_AMBULATORY_CARE_PROVIDER_SITE_OTHER): Payer: Medicare HMO | Admitting: Gastroenterology

## 2019-10-16 ENCOUNTER — Telehealth: Payer: Self-pay | Admitting: *Deleted

## 2019-10-16 ENCOUNTER — Encounter: Payer: Self-pay | Admitting: Gastroenterology

## 2019-10-16 ENCOUNTER — Other Ambulatory Visit: Payer: Self-pay

## 2019-10-16 VITALS — BP 140/95 | HR 94 | Temp 96.9°F | Ht 72.0 in | Wt 137.2 lb

## 2019-10-16 DIAGNOSIS — R69 Illness, unspecified: Secondary | ICD-10-CM | POA: Diagnosis not present

## 2019-10-16 DIAGNOSIS — R933 Abnormal findings on diagnostic imaging of other parts of digestive tract: Secondary | ICD-10-CM | POA: Diagnosis not present

## 2019-10-16 DIAGNOSIS — R627 Adult failure to thrive: Secondary | ICD-10-CM | POA: Diagnosis not present

## 2019-10-16 DIAGNOSIS — R634 Abnormal weight loss: Secondary | ICD-10-CM | POA: Diagnosis not present

## 2019-10-16 DIAGNOSIS — F1010 Alcohol abuse, uncomplicated: Secondary | ICD-10-CM

## 2019-10-16 DIAGNOSIS — F5000 Anorexia nervosa, unspecified: Secondary | ICD-10-CM

## 2019-10-16 DIAGNOSIS — K769 Liver disease, unspecified: Secondary | ICD-10-CM | POA: Diagnosis not present

## 2019-10-16 NOTE — Telephone Encounter (Signed)
CT abdomen/pelvis with contrast scheduled for 4/5 at 8:00am, arrival 7:45am, npo 4 hrs, pick up oral contrast from radiology with instructions  Called patient daughter [name], she is aware of appt details.  CT a/p approved via [employer] website. Auth# [id number] dates 10/16/2019-04/13/2020

## 2019-10-16 NOTE — Progress Notes (Signed)
Primary Care Physician: Caren Macadam, MD  Primary Gastroenterologist:  Barney Drain, MD   Chief Complaint  Patient presents with  . Weight Loss    HPI: Scott Jackson is a 69 y.o. male here for follow-up of weight loss. Last seen in 04/2019.  Patient presents with daughter Scott Jackson today. She is concerned about his overall decline in the past one year since he stopped working. He stopped working due to his fear of catching COVID. Continues to drink etoh on regular basis. States he does not eat well. Has no motivation to do anything. Moves slowly. Weight down 7 pounds since 12/2018. He never had colonoscopy scheduled at [facility] (as colonoscopy was incomplete locally). States he went for consult at the satellite clinic in [place] but did not want to have to go all the way to [facility] for his procedure.   CT pelvis without contrast in 12/2018 due to incomplete colonoscopy showed scattered colonic diverticulosis, possible mild wall thickening of the cecum and ascending colon, possibly due to limited distention.   CT chest lung cancer screening August 26, 2019 noted to have emphysema, lung RADS 2, benign appearance.  Continue annual screening with low-dose chest CT without contrast.  Patient states BMs ok. No straining. No melena, brbpr. No abdominal pain. Drinking etoh instead of eating. No vomiting. Sometimes heartburn at times but not on a regular basis. Does have some confusion and memory issues.    Has seen cardiology due to syncope. 14 day holter monitor unremarkable. ECHO pending. Neurology evaluation with abnormal EEG with "high amplitude activity over T4/F8 area, right brain, fronto temporal. Main goal is etoh cessation as etoh lowers seizure threshold."  Sees PCP tomorrow.   Labs dated 08/29/2019: White blood cell count 4100, hemoglobin 11.7, hematocrit 35.3, MCV 100.2, platelets are 275,000, BUN 5, creatinine 0.76, sodium 139, potassium 3.5, calcium 9.6, glucose 96,  albumin 4.6, total bilirubin 0.5, alkaline phosphatase 36, AST 53 high, ALT 13, TSH 1.96, B12 322.   Current Outpatient Medications  Medication Sig Dispense Refill  . amLODipine (NORVASC) 10 MG tablet Take 10 mg by mouth daily.    Marland Kitchen lisinopril-hydrochlorothiazide (PRINZIDE,ZESTORETIC) 10-12.5 MG tablet Take 1 tablet by mouth every morning. 90 tablet 1  . Multiple Vitamin (MULTIVITAMIN WITH MINERALS) TABS tablet Take 1 tablet by mouth daily.    Marland Kitchen PARoxetine (PAXIL) 10 MG tablet Take 10 mg by mouth every morning.     . Thiamine HCl (VITAMIN B-1) 250 MG tablet Take 1 tablet (250 mg total) by mouth daily. 30 tablet 5  . vitamin B-12 (CYANOCOBALAMIN) 1000 MCG tablet Take 1,000 mcg by mouth daily.     No current facility-administered medications for this visit.    Allergies as of 10/16/2019  . (No Known Allergies)    ROS:  General: Negative for  fever, chills. See hpi ENT: Negative for hoarseness, difficulty swallowing, nasal congestion. CV: Negative for chest pain, angina, palpitations, dyspnea on exertion, peripheral edema.  Respiratory: Negative for dyspnea at rest, dyspnea on exertion, cough, sputum, wheezing.  GI: See history of present illness. GU:  Negative for dysuria, hematuria, urinary incontinence, urinary frequency, nocturnal urination.  Endo: Negative for unusual weight change.    Physical Examination:   BP (!) 140/95   Pulse 94   Temp (!) 96.9 F (36.1 C) (Temporal)   Ht 6' (1.829 m)   Wt 137 lb 3.2 oz (62.2 kg)   BMI 18.61 kg/m   General:thin male in NAD. accompanied by  daughter.   Eyes: No icterus. Mouth: Oropharyngeal mucosa moist and pink , no lesions erythema or exudate. Lungs: Clear to auscultation bilaterally.  Heart: Regular rate and rhythm, no murmurs rubs or gallops.  Abdomen: Bowel sounds are normal, nontender, nondistended, no hepatosplenomegaly or masses, no abdominal bruits or hernia , no rebound or guarding.   Extremities: No lower extremity edema.  No clubbing or deformities. Neuro: Alert and oriented x 4  Some mild asterixis? Skin: Warm and dry, no jaundice.   Psych: Alert and cooperative, normal mood and affect.     Imaging Studies: LONG TERM MONITOR (3-14 DAYS)  Result Date: 10/11/2019 NSR average HR 86 bpm PAC < 1% beats PVC < 1% beats SVT not symptomatic 6-16 beasts short

## 2019-10-16 NOTE — Patient Instructions (Signed)
1. Please have labs done as discussed. If you have done with Dr. Mannie Stabile, make sure you let us know so we can be on look out for results.  2. CT scan of abdomen.  3. We will call with further instructions once results available.

## 2019-10-17 ENCOUNTER — Encounter: Payer: Self-pay | Admitting: Gastroenterology

## 2019-10-17 DIAGNOSIS — R296 Repeated falls: Secondary | ICD-10-CM | POA: Diagnosis not present

## 2019-10-17 DIAGNOSIS — E46 Unspecified protein-calorie malnutrition: Secondary | ICD-10-CM | POA: Diagnosis not present

## 2019-10-17 DIAGNOSIS — R69 Illness, unspecified: Secondary | ICD-10-CM | POA: Diagnosis not present

## 2019-10-17 DIAGNOSIS — Z720 Tobacco use: Secondary | ICD-10-CM | POA: Diagnosis not present

## 2019-10-17 DIAGNOSIS — I10 Essential (primary) hypertension: Secondary | ICD-10-CM | POA: Diagnosis not present

## 2019-10-17 LAB — COMPREHENSIVE METABOLIC PANEL
AG Ratio: 1.4 (calc) (ref 1.0–2.5)
ALT: 30 U/L (ref 9–46)
AST: 125 U/L — ABNORMAL HIGH (ref 10–35)
Albumin: 3.9 g/dL (ref 3.6–5.1)
Alkaline phosphatase (APISO): 44 U/L (ref 35–144)
BUN: 12 mg/dL (ref 7–25)
CO2: 25 mmol/L (ref 20–32)
Calcium: 9.4 mg/dL (ref 8.6–10.3)
Chloride: 101 mmol/L (ref 98–110)
Creat: 0.89 mg/dL (ref 0.70–1.25)
Globulin: 2.7 g/dL (calc) (ref 1.9–3.7)
Glucose, Bld: 78 mg/dL (ref 65–139)
Potassium: 4 mmol/L (ref 3.5–5.3)
Sodium: 143 mmol/L (ref 135–146)
Total Bilirubin: 0.3 mg/dL (ref 0.2–1.2)
Total Protein: 6.6 g/dL (ref 6.1–8.1)

## 2019-10-17 LAB — CBC WITH DIFFERENTIAL/PLATELET
Absolute Monocytes: 192 cells/uL — ABNORMAL LOW (ref 200–950)
Basophils Absolute: 40 cells/uL (ref 0–200)
Basophils Relative: 1.3 %
Eosinophils Absolute: 102 cells/uL (ref 15–500)
Eosinophils Relative: 3.3 %
HCT: 35.6 % — ABNORMAL LOW (ref 38.5–50.0)
Hemoglobin: 11.8 g/dL — ABNORMAL LOW (ref 13.2–17.1)
Lymphs Abs: 1488 cells/uL (ref 850–3900)
MCH: 33.2 pg — ABNORMAL HIGH (ref 27.0–33.0)
MCHC: 33.1 g/dL (ref 32.0–36.0)
MCV: 100.3 fL — ABNORMAL HIGH (ref 80.0–100.0)
MPV: 9.7 fL (ref 7.5–12.5)
Monocytes Relative: 6.2 %
Neutro Abs: 1277 cells/uL — ABNORMAL LOW (ref 1500–7800)
Neutrophils Relative %: 41.2 %
Platelets: 311 10*3/uL (ref 140–400)
RBC: 3.55 10*6/uL — ABNORMAL LOW (ref 4.20–5.80)
RDW: 15.8 % — ABNORMAL HIGH (ref 11.0–15.0)
Total Lymphocyte: 48 %
WBC: 3.1 10*3/uL — ABNORMAL LOW (ref 3.8–10.8)

## 2019-10-17 LAB — LIPASE: Lipase: 11 U/L (ref 7–60)

## 2019-10-17 LAB — PROTIME-INR
INR: 1
Prothrombin Time: 10.2 s (ref 9.0–11.5)

## 2019-10-17 LAB — AMMONIA: Ammonia: 74 umol/L — ABNORMAL HIGH (ref <=72)

## 2019-10-20 NOTE — Assessment & Plan Note (Signed)
69 y/o male with decline in overall health over the past one year associated with weight loss, mental/physical decline, poor oral intake in setting of ongoing etoh abuse. He has h/o abnormal colon on CT last year and did not complete colonoscopy as recommended because he did not want to travel to [place]. Given ongoing poor appetite, weight loss, etoh use we will assess for underlying malignancy, cirrhosis. Obtain ammonia level to evaluate for possible mild hepatic encephalopathy contributing to symptoms.   Obtain labs. CT A/P with contrast. Further recommendations to follow.

## 2019-10-21 ENCOUNTER — Other Ambulatory Visit: Payer: Self-pay | Admitting: Gastroenterology

## 2019-10-21 MED ORDER — LACTULOSE 10 GM/15ML PO SOLN: 20.0000 g | Freq: Two times a day (BID) | ORAL | 0 refills | Status: DC

## 2019-10-21 NOTE — Progress Notes (Deleted)
Cardiology Office Note   Date:  10/21/2019   ID:  KASPEN, MURK 1950/07/29, MRN [id number]  PCP:  Caren Macadam, MD  Cardiologist:    Dr. Johnsie Cancel   No chief complaint on file.     History of Present Illness: Scott Jackson is a 69 y.o. male who presents for ***  Last seen by Dr. Johnsie Cancel 01/11/18   He is an alcoholic and has refused inpatient Rx. He had a lung cancer screening scan that noted calcium in his LAD. He has no cardiac history I reviewed scan from 12/03/17  And he had a small amount of calcium in the proximal to mid LAD and none in the RCA or circumflex. He has recently been more compliant with his BP meds. Still does not exercise and has poor diet Dr Glori Bickers felt he should have a stress test Still smoking at least a ppd Screening CT also showed moderate emphysema and a part solid cycstic lesion in the medial left lower lobe measuring 8.6 mm Recommendation for f/u CT in 6 months  Prior visit he was drinking vodka 3 shots ors so at night Does not drink during day Smokes about 1 ppd.   ETT  11/2017 low risk with hypertensive response to exercise.  F/u  CT in 05/2018  Benign appearance + emphysema, aortic atherosclerosis   Today he has had several episodes of syncope.  This has been occurring over the last year.  His daughter brought him in for eval.  No chest pain or SOB.  He has had several episodes, usually when sitting at times with standing.  No injuries.  He is down to 1/2 ppd and 3 drinks per week.  His diet is poor, no appetite, bu the has been drinking boost.  His wt is the same from 01/11/18.  No lightheadedness just out.  Today  No orthostatic BP though HR does increase with standing.  From 84 at rest to 113 with standing.  No symptoms today with orthostatic BP checks.  He does eat chicken noodle soup, so his salt intake is adequate.  We discussed support stockings to help prevent any drops in BP.   no awareness or heart beat or rapid heart beats.     ECHO *** to  be done  Monitor SR PAC <1% and PVC <1% NSR,  SVT not symptomatic 6/16 beats  Neurology evaluation with abnormal EEG with "high amplitude activity over T4/F8 area, right brain, fronto temporal. Main goal is etoh cessation as etoh lowers seizure threshold  Past Medical History:  Diagnosis Date  . ETOH abuse   . Hypertension   . Pulmonary nodule   . Tobacco abuse     Past Surgical History:  Procedure Laterality Date  . ANKLE FRACTURE SURGERY    . APPENDECTOMY    . BIOPSY  01/08/2019   Procedure: BIOPSY;  Surgeon: Danie Binder, MD;  [facility];  Service: Endoscopy;;  gastric  . COLONOSCOPY  01/2015   Dr. Posey Pronto: two 2-5mm rectal polyps removed, hyperplastic  . COLONOSCOPY  2007   Dr. Posey Pronto: three adenomas removed measuring 4, 10, 41mm. diverticulosis  . COLONOSCOPY WITH PROPOFOL N/A 01/08/2019   Dr. Oneida Alar: Severe diverticulosis with fixed rectosigmoid region, rectal bleeding likely due to internal hemorrhoids.  Recommended repeat colonoscopy at [facility] because examination was incomplete due to fixed rectosigmoid colon (patient never followed through).  . ESOPHAGOGASTRODUODENOSCOPY (EGD) WITH PROPOFOL N/A 01/08/2019   Dr. Oneida Alar: Low-grade narrowing Schatzki ring  at the GE junction, medium sized hiatal hernia, erosive gastritis/duodenitis in the setting of NSAID and alcohol use.  Gastric biopsy showed gastropathy but no H. pylori  . intestininal blockage  1970     Current Outpatient Medications  Medication Sig Dispense Refill  . amLODipine (NORVASC) 10 MG tablet Take 10 mg by mouth daily.    Marland Kitchen lactulose (CHRONULAC) 10 GM/15ML solution Take 30 mLs (20 g total) by mouth 2 (two) times daily. To have 2-3 soft stools daily to keep ammonia down and stay mentally clear. 1892 mL 0  . lisinopril-hydrochlorothiazide (PRINZIDE,ZESTORETIC) 10-12.5 MG tablet Take 1 tablet by mouth every morning. 90 tablet 1  . Multiple Vitamin (MULTIVITAMIN WITH MINERALS) TABS tablet Take 1 tablet  by mouth daily.    Marland Kitchen PARoxetine (PAXIL) 10 MG tablet Take 10 mg by mouth every morning.     . Thiamine HCl (VITAMIN B-1) 250 MG tablet Take 1 tablet (250 mg total) by mouth daily. 30 tablet 5  . vitamin B-12 (CYANOCOBALAMIN) 1000 MCG tablet Take 1,000 mcg by mouth daily.     No current facility-administered medications for this visit.    Allergies:   Patient has no known allergies.    Social History:  The patient  reports that he has been smoking cigarettes. He has a 12.50 pack-year smoking history. He has never used smokeless tobacco. He reports current alcohol use. He reports that he does not use drugs.   Family History:  The patient's ***family history includes Anxiety disorder in his daughter; Asthma in his daughter; Breast cancer in his sister and sister; Fibromyalgia in his daughter; Hypertension in his mother; Lupus in his daughter; Post-traumatic stress disorder in his daughter; Stroke in his mother.    ROS:  General:no colds or fevers, no weight changes Skin:no rashes or ulcers HEENT:no blurred vision, no congestion CV:see HPI PUL:see HPI GI:no diarrhea constipation or melena, no indigestion GU:no hematuria, no dysuria MS:no joint pain, no claudication Neuro:no syncope, no lightheadedness Endo:no diabetes, no thyroid disease Wt Readings from Last 3 Encounters:  10/16/19 137 lb 3.2 oz (62.2 kg)  08/22/19 140 lb (63.5 kg)  05/13/19 134 lb 6.4 oz (61 kg)     PHYSICAL EXAM: VS:  There were no vitals taken for this visit. , BMI There is no height or weight on file to calculate BMI. General:Pleasant affect, NAD Skin:Warm and dry, brisk capillary refill HEENT:normocephalic, sclera clear, mucus membranes moist Neck:supple, no JVD, no bruits  Heart:S1S2 RRR without murmur, gallup, rub or click Lungs:clear without rales, rhonchi, or wheezes [id number], non tender, + BS, do not palpate liver spleen or masses Ext:no lower ext edema, 2+ pedal pulses, 2+ radial pulses Neuro:alert  and oriented, MAE, follows commands, + facial symmetry    EKG:  EKG is ordered today. The ekg ordered today demonstrates ***   Recent Labs: 10/16/2019: ALT 30; BUN 12; Creat 0.89; Hemoglobin 11.8; Platelets 311; Potassium 4.0; Sodium 143    Lipid Panel    Component Value Date/Time   CHOL 207 (H) 11/23/2017 1225   TRIG 139 11/23/2017 1225   HDL 97 11/23/2017 1225   CHOLHDL 2.1 11/23/2017 1225   LDLCALC 86 11/23/2017 1225       Other studies Reviewed: Additional studies/ records that were reviewed today include: ***.  ETT 01/18/18  Normal ETT ASSESSMENT AND PLAN:  1.  ***   Current medicines are reviewed with the patient today.  The patient Has no concerns regarding medicines.  The following changes have been  made:  See above Labs/ tests ordered today include:see above  Disposition:   FU:  see above  Signed, Cecilie Kicks, NP  10/21/2019 10:18 PM    [facility] [place], [place], [place], [place] Phone: [phone]; Fax: [phone]

## 2019-10-22 ENCOUNTER — Ambulatory Visit: Payer: Medicare HMO | Admitting: Cardiology

## 2019-10-22 ENCOUNTER — Other Ambulatory Visit: Payer: Self-pay

## 2019-10-22 ENCOUNTER — Ambulatory Visit (HOSPITAL_COMMUNITY): Payer: Medicare HMO | Attending: Cardiology

## 2019-10-22 DIAGNOSIS — R55 Syncope and collapse: Secondary | ICD-10-CM | POA: Diagnosis not present

## 2019-10-22 DIAGNOSIS — I2510 Atherosclerotic heart disease of native coronary artery without angina pectoris: Secondary | ICD-10-CM | POA: Diagnosis not present

## 2019-10-26 ENCOUNTER — Emergency Department (HOSPITAL_COMMUNITY): Payer: Medicare HMO

## 2019-10-26 ENCOUNTER — Encounter (HOSPITAL_COMMUNITY): Payer: Self-pay | Admitting: *Deleted

## 2019-10-26 ENCOUNTER — Emergency Department (HOSPITAL_COMMUNITY)
Admission: EM | Admit: 2019-10-26 | Discharge: 2019-10-26 | Disposition: A | Discharge status: Home / Self Care | Payer: Medicare HMO | Attending: Emergency Medicine | Admitting: Emergency Medicine

## 2019-10-26 ENCOUNTER — Other Ambulatory Visit: Payer: Self-pay

## 2019-10-26 DIAGNOSIS — Z79899 Other long term (current) drug therapy: Secondary | ICD-10-CM | POA: Insufficient documentation

## 2019-10-26 DIAGNOSIS — Y999 Unspecified external cause status: Secondary | ICD-10-CM | POA: Insufficient documentation

## 2019-10-26 DIAGNOSIS — R531 Weakness: Secondary | ICD-10-CM | POA: Insufficient documentation

## 2019-10-26 DIAGNOSIS — S199XXA Unspecified injury of neck, initial encounter: Secondary | ICD-10-CM | POA: Diagnosis not present

## 2019-10-26 DIAGNOSIS — F17210 Nicotine dependence, cigarettes, uncomplicated: Secondary | ICD-10-CM | POA: Insufficient documentation

## 2019-10-26 DIAGNOSIS — S161XXA Strain of muscle, fascia and tendon at neck level, initial encounter: Secondary | ICD-10-CM | POA: Diagnosis not present

## 2019-10-26 DIAGNOSIS — N179 Acute kidney failure, unspecified: Secondary | ICD-10-CM | POA: Diagnosis not present

## 2019-10-26 DIAGNOSIS — Y9389 Activity, other specified: Secondary | ICD-10-CM | POA: Insufficient documentation

## 2019-10-26 DIAGNOSIS — I10 Essential (primary) hypertension: Secondary | ICD-10-CM | POA: Insufficient documentation

## 2019-10-26 DIAGNOSIS — R630 Anorexia: Secondary | ICD-10-CM | POA: Diagnosis present

## 2019-10-26 DIAGNOSIS — W010XXA Fall on same level from slipping, tripping and stumbling without subsequent striking against object, initial encounter: Secondary | ICD-10-CM | POA: Insufficient documentation

## 2019-10-26 DIAGNOSIS — S0990XA Unspecified injury of head, initial encounter: Secondary | ICD-10-CM

## 2019-10-26 DIAGNOSIS — Y929 Unspecified place or not applicable: Secondary | ICD-10-CM | POA: Insufficient documentation

## 2019-10-26 DIAGNOSIS — R69 Illness, unspecified: Secondary | ICD-10-CM | POA: Diagnosis not present

## 2019-10-26 DIAGNOSIS — E860 Dehydration: Secondary | ICD-10-CM | POA: Insufficient documentation

## 2019-10-26 LAB — CBC WITH DIFFERENTIAL/PLATELET
Abs Immature Granulocytes: 0 10*3/uL (ref 0.00–0.07)
Basophils Absolute: 0 10*3/uL (ref 0.0–0.1)
Basophils Relative: 1 %
Eosinophils Absolute: 0.1 10*3/uL (ref 0.0–0.5)
Eosinophils Relative: 2 %
HCT: 37.3 % — ABNORMAL LOW (ref 39.0–52.0)
Hemoglobin: 12 g/dL — ABNORMAL LOW (ref 13.0–17.0)
Immature Granulocytes: 0 %
Lymphocytes Relative: 56 %
Lymphs Abs: 2 10*3/uL (ref 0.7–4.0)
MCH: 32.3 pg (ref 26.0–34.0)
MCHC: 32.2 g/dL (ref 30.0–36.0)
MCV: 100.5 fL — ABNORMAL HIGH (ref 80.0–100.0)
Monocytes Absolute: 0.3 10*3/uL (ref 0.1–1.0)
Monocytes Relative: 9 %
Neutro Abs: 1.1 10*3/uL — ABNORMAL LOW (ref 1.7–7.7)
Neutrophils Relative %: 32 %
Platelets: 272 10*3/uL (ref 150–400)
RBC: 3.71 MIL/uL — ABNORMAL LOW (ref 4.22–5.81)
RDW: 15.9 % — ABNORMAL HIGH (ref 11.5–15.5)
WBC: 3.5 10*3/uL — ABNORMAL LOW (ref 4.0–10.5)
nRBC: 0 % (ref 0.0–0.2)

## 2019-10-26 LAB — COMPREHENSIVE METABOLIC PANEL
ALT: 15 U/L (ref 0–44)
AST: 24 U/L (ref 15–41)
Albumin: 3.6 g/dL (ref 3.5–5.0)
Alkaline Phosphatase: 37 U/L — ABNORMAL LOW (ref 38–126)
Anion gap: 14 (ref 5–15)
BUN: 29 mg/dL — ABNORMAL HIGH (ref 8–23)
CO2: 25 mmol/L (ref 22–32)
Calcium: 9.5 mg/dL (ref 8.9–10.3)
Chloride: 98 mmol/L (ref 98–111)
Creatinine, Ser: 1.6 mg/dL — ABNORMAL HIGH (ref 0.61–1.24)
GFR calc Af Amer: 51 mL/min — ABNORMAL LOW (ref >60)
GFR calc non Af Amer: 44 mL/min — ABNORMAL LOW (ref >60)
Glucose, Bld: 101 mg/dL — ABNORMAL HIGH (ref 70–99)
Potassium: 3.7 mmol/L (ref 3.5–5.1)
Sodium: 137 mmol/L (ref 135–145)
Total Bilirubin: 0.4 mg/dL (ref 0.3–1.2)
Total Protein: 6.8 g/dL (ref 6.5–8.1)

## 2019-10-26 LAB — VITAMIN B12: Vitamin B-12: 561 pg/mL (ref 180–914)

## 2019-10-26 LAB — ETHANOL: Alcohol, Ethyl (B): 218 mg/dL — ABNORMAL HIGH (ref <10)

## 2019-10-26 MED ORDER — THIAMINE HCL 100 MG PO TABS
100.0000 mg | ORAL_TABLET | Freq: Once | ORAL | Status: AC
  Administered 2019-10-26: 100 mg via ORAL
  Filled 2019-10-26: qty 1

## 2019-10-26 MED ORDER — SODIUM CHLORIDE 0.9 % IV BOLUS
500.0000 mL | Freq: Once | INTRAVENOUS | Status: AC
  Administered 2019-10-26: 500 mL via INTRAVENOUS

## 2019-10-26 NOTE — ED Notes (Signed)
Pt off floor with CT

## 2019-10-26 NOTE — ED Provider Notes (Signed)
[facility] EMERGENCY DEPARTMENT Provider Note   CSN: [id number] Arrival date & time: 10/26/19  1234     History Chief Complaint  Patient presents with  . Fall    Bayardo Nassif is a 69 y.o. male.  HPI   This patient is a pleasant 69 year old male, history of alcohol abuse hypertension and a recent abnormal CT scan with a recent diagnosis of anemia as well as an elevated ammonia at 74, INR was 1.0, metabolic panel showed potassium and sodium that were normal, AST was 125 with a normal ALT, CBC showed a mild anemia with a hemoglobin of 11.8, platelets of 311 and white blood cell count of 3.1.  His MCV was 100.3.  The patient is known to drink heavy amounts of alcohol in fact he is an alcoholic.  He has had some anorexia loss of weight and has a history of a prior seizure which was thought to be alcohol withdrawal.  The patient presents in the care of his daughter who is the primary caregiver and checks in on him and his mother daily, states that he has been increasingly weak, has had a couple of falls recently 1 of which was on Monday, about 5 days ago when he was getting out of the car, he fell backwards and struck his head on the ground.  There was no loss of consciousness or seizure activity but is been complaining of a headache and neck pain since that time.  He is currently under the care of his primary caregiver who is taking care of the above abnormal labs and is following up these with a CT scan which is scheduled for April 5, in 2 days.  The patient denies any vomiting nausea diarrhea, continues to drink heavy amounts of alcohol  Past Medical History:  Diagnosis Date  . ETOH abuse   . Hypertension   . Pulmonary nodule   . Tobacco abuse     Patient Active Problem List   Diagnosis Date Noted  . Anorexia nervosa 10/16/2019  . Loss of weight 05/13/2019  . Abnormal CT scan, colon 05/13/2019  . Single seizure ([place]) 04/09/2019  . Adult failure to thrive 04/09/2019  .  Alcoholism with alcohol dependence ([place]) 03/19/2019  . Alcoholism associated with dementia ([place]) 03/19/2019  . Emphysema of lung ([place]) 03/19/2019  . Abdominal pain, epigastric   . Hemorrhoid 09/28/2018  . Rectal bleeding 09/28/2018  . Constipation 09/28/2018  . Screen for sexually transmitted diseases 11/24/2017  . Hyperlipidemia 11/24/2017  . HTN, goal below 140/90 11/24/2017  . Screening for viral disease 11/24/2017  . Alcohol abuse 11/24/2017  . Alcohol abuse counseling and surveillance of alcoholic [id number]    Past Surgical History:  Procedure Laterality Date  . ANKLE FRACTURE SURGERY    . APPENDECTOMY    . BIOPSY  01/08/2019   Procedure: BIOPSY;  Surgeon: Danie Binder, MD;  Location[facility];  Service: Endoscopy;;  gastric  . COLONOSCOPY  01/2015   Dr. Posey Pronto: two 2-65mm rectal polyps removed, hyperplastic  . COLONOSCOPY  2007   Dr. Posey Pronto: three adenomas removed measuring 4, 10, 21mm. diverticulosis  . COLONOSCOPY WITH PROPOFOL N/A 01/08/2019   Dr. Oneida Alar: Severe diverticulosis with fixed rectosigmoid region, rectal bleeding likely due to internal hemorrhoids.  Recommended repeat colonoscopy at [facility] because examination was incomplete due to fixed rectosigmoid colon (patient never followed through).  . ESOPHAGOGASTRODUODENOSCOPY (EGD) WITH PROPOFOL N/A 01/08/2019   Dr. Oneida Alar: Low-grade narrowing Schatzki ring at the [place]  junction, medium sized hiatal hernia, erosive gastritis/duodenitis in the setting of NSAID and alcohol use.  Gastric biopsy showed gastropathy but no H. pylori  . intestininal blockage  1970       Family History  Problem Relation Age of Onset  . Hypertension Mother   . Stroke Mother   . Fibromyalgia Daughter   . Lupus Daughter   . Anxiety disorder Daughter   . Post-traumatic stress disorder Daughter   . Asthma Daughter   . Breast cancer Sister   . Breast cancer Sister   . Colon cancer Neg Hx     Social History   Tobacco Use  .  Smoking status: Current Every Day Smoker    Packs/day: 0.25    Years: 50.00    Pack years: 12.50    Types: Cigarettes  . Smokeless tobacco: Never Used  Substance Use Topics  . Alcohol use: Yes    Comment: 5th of liquor   . Drug use: No    Home Medications Prior to Admission medications   Medication Sig Start Date End Date Taking? Authorizing Provider  amLODipine (NORVASC) 10 MG tablet Take 10 mg by mouth daily. 11/20/18  Yes [provider]  Cyanocobalamin (B-12) 500 MCG SUBL DISSOLVE UNDER TONGUE ONCE DAILY AS DIRECTED 08/30/19  Yes [provider]  lactulose (CHRONULAC) 10 GM/15ML solution Take 30 mLs (20 g total) by mouth 2 (two) times daily. To have 2-3 soft stools daily to keep ammonia down and stay mentally clear. 10/21/19  Yes Mahala Menghini, PA-C  lisinopril-hydrochlorothiazide (PRINZIDE,ZESTORETIC) 10-12.5 MG tablet Take 1 tablet by mouth every morning. 02/01/18  Yes Hagler, Apolonio Schneiders, MD  losartan (COZAAR) 50 MG tablet Take 50 mg by mouth daily. 08/29/19  Yes [provider]  Multiple Vitamin (MULTIVITAMIN WITH MINERALS) TABS tablet Take 1 tablet by mouth daily.   Yes [provider]  omeprazole (PRILOSEC) 20 MG capsule Take 20 mg by mouth every morning. 08/30/19  Yes [provider]  PARoxetine (PAXIL) 40 MG tablet Take 40 mg by mouth every morning.   Yes [provider]    Allergies    Patient has no known allergies.  Review of Systems   Review of Systems  All other systems reviewed and are negative.   Physical Exam Updated Vital Signs BP 97/71   Pulse 76   Temp 97.7 F (36.5 C)   Resp 14   Ht 1.854 m (6\' 1" )   Wt 65.8 kg   SpO2 96%   BMI 19.13 kg/m   Physical Exam Vitals and nursing note reviewed.  Constitutional:      General: He is not in acute distress.    Appearance: He is well-developed.  HENT:     Head: Normocephalic and atraumatic.     Comments: The patient is missing multiple teeth, his dentition is  otherwise intact with no swelling of the gums, no swelling of the face, no malocclusion, no hemotympanum, no tenderness over the head, no hematomas or scalp injury    Mouth/Throat:     Pharynx: No oropharyngeal exudate.  Eyes:     General: No scleral icterus.       Right eye: No discharge.        Left eye: No discharge.     Conjunctiva/sclera: Conjunctivae normal.     Pupils: Pupils are equal, round, and reactive to light.  Neck:     Thyroid: No thyromegaly.     Vascular: No JVD.  Cardiovascular:  Rate and Rhythm: Normal rate and regular rhythm.     Heart sounds: Normal heart sounds. No murmur. No friction rub. No gallop.   Pulmonary:     Effort: Pulmonary effort is normal. No respiratory distress.     Breath sounds: Normal breath sounds. No wheezing or rales.  Abdominal:     General: Bowel sounds are normal. There is no distension.     Palpations: Abdomen is soft. There is no mass.     Tenderness: There is no abdominal tenderness.  Musculoskeletal:        General: Tenderness present. Normal range of motion.     Cervical back: Normal range of motion and neck supple.     Comments: Minimal tenderness over the cervical spine, no tenderness over the thoracic or lumbar spines, all 4 extremities with supple joints and soft compartments diffusely  Lymphadenopathy:     Cervical: No cervical adenopathy.  Skin:    General: Skin is warm and dry.     Findings: No erythema or rash.  Neurological:     Mental Status: He is alert.     Coordination: Coordination normal.     Comments: Normal speech and coordination, normal strength in all 4 extremities, he is able to move both arms and normal range of motion with normal strength, normal grips, no tremor  Psychiatric:        Behavior: Behavior normal.     ED Results / Procedures / Treatments   Labs (all labs ordered are listed, but only abnormal results are displayed) Labs Reviewed  CBC WITH DIFFERENTIAL/PLATELET - Abnormal; Notable for  the following components:      Result Value   WBC 3.5 (*)    RBC 3.71 (*)    Hemoglobin 12.0 (*)    HCT 37.3 (*)    MCV 100.5 (*)    RDW 15.9 (*)    Neutro Abs 1.1 (*)    All other components within normal limits  COMPREHENSIVE METABOLIC PANEL - Abnormal; Notable for the following components:   Glucose, Bld 101 (*)    BUN 29 (*)    Creatinine, Ser 1.60 (*)    Alkaline Phosphatase 37 (*)    GFR calc non Af Amer 44 (*)    GFR calc Af Amer 51 (*)    All other components within normal limits  ETHANOL - Abnormal; Notable for the following components:   Alcohol, Ethyl (B) 218 (*)    All other components within normal limits  VITAMIN B12    EKG None  Radiology CT Head Wo Contrast  Result Date: 10/26/2019 CLINICAL DATA:  [place] out of his car 2 days ago. Patient landed on his back and hit the back of his head. Complaining of head and neck pain. EXAM: CT HEAD WITHOUT CONTRAST CT CERVICAL SPINE WITHOUT CONTRAST TECHNIQUE: Multidetector CT imaging of the head and cervical spine was performed following the standard protocol without intravenous contrast. Multiplanar CT image reconstructions of the cervical spine were also generated. COMPARISON:  None. FINDINGS: CT HEAD FINDINGS Brain: No evidence of acute infarction, hemorrhage, hydrocephalus, extra-axial collection or mass lesion/mass effect. There is ventricular and sulcal enlargement reflecting mild diffuse atrophy, advanced for age. Vascular: No hyperdense vessel or unexpected calcification. Skull: Normal. Negative for fracture or focal lesion. Sinuses/Orbits: Visualized globes and orbits are unremarkable. The visualized sinuses and mastoid air cells are clear. Other: None. CT CERVICAL SPINE FINDINGS Alignment: Normal. Skull base and vertebrae: No acute fracture. No primary bone lesion or focal  pathologic process. Soft tissues and spinal canal: No prevertebral fluid or swelling. No visible canal hematoma. Disc levels: Mild loss of disc height at  C5-C6 and C6-C7 with mild spondylotic disc bulging. Mild disc bulging with endplate spurring also noted at C4-C5. No convincing disc herniation. Upper chest: No acute findings. Pleuroparenchymal scarring at the lung apices. Other: None. IMPRESSION: HEAD CT 1. No acute intracranial abnormalities.  No skull fracture. 2. Mild atrophy, advanced for age. CERVICAL CT 1. No fracture or acute finding. Electronically Signed   By: Lajean Manes M.D.   On: 10/26/2019 14:06   CT Cervical Spine Wo Contrast  Result Date: 10/26/2019 CLINICAL DATA:  [place] out of his car 2 days ago. Patient landed on his back and hit the back of his head. Complaining of head and neck pain. EXAM: CT HEAD WITHOUT CONTRAST CT CERVICAL SPINE WITHOUT CONTRAST TECHNIQUE: Multidetector CT imaging of the head and cervical spine was performed following the standard protocol without intravenous contrast. Multiplanar CT image reconstructions of the cervical spine were also generated. COMPARISON:  None. FINDINGS: CT HEAD FINDINGS Brain: No evidence of acute infarction, hemorrhage, hydrocephalus, extra-axial collection or mass lesion/mass effect. There is ventricular and sulcal enlargement reflecting mild diffuse atrophy, advanced for age. Vascular: No hyperdense vessel or unexpected calcification. Skull: Normal. Negative for fracture or focal lesion. Sinuses/Orbits: Visualized globes and orbits are unremarkable. The visualized sinuses and mastoid air cells are clear. Other: None. CT CERVICAL SPINE FINDINGS Alignment: Normal. Skull base and vertebrae: No acute fracture. No primary bone lesion or focal pathologic process. Soft tissues and spinal canal: No prevertebral fluid or swelling. No visible canal hematoma. Disc levels: Mild loss of disc height at C5-C6 and C6-C7 with mild spondylotic disc bulging. Mild disc bulging with endplate spurring also noted at C4-C5. No convincing disc herniation. Upper chest: No acute findings. Pleuroparenchymal scarring at the  lung apices. Other: None. IMPRESSION: HEAD CT 1. No acute intracranial abnormalities.  No skull fracture. 2. Mild atrophy, advanced for age. CERVICAL CT 1. No fracture or acute finding. Electronically Signed   By: Lajean Manes M.D.   On: 10/26/2019 14:06    Procedures Procedures (including critical care time)  Medications Ordered in ED Medications  thiamine tablet 100 mg (has no administration in time range)  sodium chloride 0.9 % bolus 500 mL (has no administration in time range)    ED Course  I have reviewed the triage vital signs and the nursing notes.  Pertinent labs & imaging results that were available during my care of the patient were reviewed by me and considered in my medical decision making (see chart for details).  Clinical Course as of Oct 25 1453  Sat Oct 26, 2019  1452 Both the BUN and the creatinine were elevated consistent with a possible acute kidney injury.  The patient's alcohol level is over 200, his hemoglobin and CBC remained in a stable range with no thrombocytopenia.  Electrolytes are unremarkable.  B12 level is also normal.  Vital signs remained in a normal range for this man who is slightly hypotensive at 97/71 but has a heart rate of 76, good pulses and is afebrile.  He has no infectious symptoms.  IV fluid was given as a 500 cc bolus, he will need his renal function rechecked this week, he is not vomiting and I do not think he needs to be admitted to the hospital for that isolated finding   [BM]    Clinical Course User Index [BM] Sabra Heck,  Aaron Edelman, MD   MDM Rules/Calculators/A&P                      The patient has some aspect of likely alcoholic malnutrition and probably has vitamin deficiencies, he may have a low 123456, he certainly has elevated liver function test based on my review of his prior medical record.  He has good pulses at the radial artery, no edema and on my exam appears to be awake alert without any complaints.  He does have this mild headache and  neck pain which will need to be evaluated with a CT scan given his recent injury to look for subdural hematoma or cervical spine fracture.  The patient is agreeable to the plan.  I will also follow-up his previous labs with repeat labs today to make sure there is no changes or worsening of his condition.   This patient complains of fall and head injury, this involves an extensive number of treatment options, and is a complaint that carries with it a high risk of complications and morbidity.  The differential diagnosis includes subdural hematoma, subarachnoid hemorrhage, contusion, concussion    I Ordered, reviewed, and interpreted labs, which included CT scan of the head and the cervical spine, CBC, CMP, alcohol level, B12 level  I ordered medication normal saline for mild dehydration  I ordered imaging studies which included CT scan of the head and cervical spine and  I independently visualized and interpreted imaging which showed no signs of acute intracranial hemorrhage or fracture of the skull or the cervical spine  Additional history obtained from the daughter who is able to corroborate the patient story as well as the medical record which I thoroughly reviewed  Previous records obtained and reviewed and shows that the patient has had abnormal lab work recently as well as CT scan imaging in the past that did not show any specific findings  I consulted with family and the patient and discussed lab and imaging findings  Critical interventions: IV normal saline for acute kidney injury and dehydration with elevated BUN/creatinine  After the interventions stated above, I reevaluated the patient and found improved, mental status improved, blood pressure improved  The patient and family member agree to follow-up in the outpatient setting   Final Clinical Impression(s) / ED Diagnoses Final diagnoses:  Dehydration  AKI (acute kidney injury) ([place])  Injury of head, initial encounter    Strain of neck muscle, initial encounter      Noemi Chapel, MD 10/26/19 1456

## 2019-10-26 NOTE — Discharge Instructions (Signed)
Your testing today shows that you have mild dehydration, your alcohol level was almost 4 times the legal limit.  I would encourage you to decrease the your alcohol intake, make sure you are taking plenty of clear liquids, follow-up with your doctor for a recheck of your blood work in 1 week, you may seek medical exam in the emergency department for any severe or worsening symptoms

## 2019-10-26 NOTE — ED Triage Notes (Signed)
Patient fell out of his car two days ago landing on his back and hitting the back of his head on pavement.  Patient complains of head and neck pain.  Patient is not on any blood thinners.

## 2019-10-28 ENCOUNTER — Ambulatory Visit
Admission: EM | Admit: 2019-10-28 | Discharge: 2019-10-28 | Disposition: A | Discharge status: Home / Self Care | Payer: Medicare HMO | Attending: Emergency Medicine | Admitting: Emergency Medicine

## 2019-10-28 ENCOUNTER — Ambulatory Visit (HOSPITAL_COMMUNITY)
Admission: RE | Admit: 2019-10-28 | Discharge: 2019-10-28 | Disposition: A | Discharge status: Home / Self Care | Payer: Medicare HMO | Source: Ambulatory Visit | Attending: Gastroenterology | Admitting: Gastroenterology

## 2019-10-28 ENCOUNTER — Encounter: Payer: Self-pay | Admitting: Emergency Medicine

## 2019-10-28 ENCOUNTER — Telehealth: Payer: Self-pay | Admitting: Orthopedic Surgery

## 2019-10-28 ENCOUNTER — Other Ambulatory Visit: Payer: Self-pay

## 2019-10-28 DIAGNOSIS — F5000 Anorexia nervosa, unspecified: Secondary | ICD-10-CM | POA: Diagnosis present

## 2019-10-28 DIAGNOSIS — R627 Adult failure to thrive: Secondary | ICD-10-CM | POA: Diagnosis present

## 2019-10-28 DIAGNOSIS — F1010 Alcohol abuse, uncomplicated: Secondary | ICD-10-CM | POA: Insufficient documentation

## 2019-10-28 DIAGNOSIS — M109 Gout, unspecified: Secondary | ICD-10-CM

## 2019-10-28 DIAGNOSIS — S3991XA Unspecified injury of abdomen, initial encounter: Secondary | ICD-10-CM | POA: Diagnosis not present

## 2019-10-28 DIAGNOSIS — R933 Abnormal findings on diagnostic imaging of other parts of digestive tract: Secondary | ICD-10-CM | POA: Diagnosis not present

## 2019-10-28 DIAGNOSIS — R69 Illness, unspecified: Secondary | ICD-10-CM | POA: Diagnosis not present

## 2019-10-28 MED ORDER — PREDNISONE 10 MG (21) PO TBPK: ORAL_TABLET | ORAL | 0 refills | Status: DC

## 2019-10-28 MED ORDER — ACETAMINOPHEN ER 650 MG PO TBCR: 650.0000 mg | EXTENDED_RELEASE_TABLET | Freq: Three times a day (TID) | ORAL | 0 refills | Status: DC | PRN

## 2019-10-28 MED ORDER — IOHEXOL 300 MG/ML  SOLN
75.0000 mL | Freq: Once | INTRAMUSCULAR | Status: AC | PRN
  Administered 2019-10-28: 75 mL via INTRAVENOUS

## 2019-10-28 NOTE — ED Triage Notes (Signed)
Pt c/o of left hand and wrist pain x 3 weeks. Swelling and warmth noted. HX of gout

## 2019-10-28 NOTE — Telephone Encounter (Signed)
Patient requesting immediate appointment today for hand problem; states hurt it when he had a recent fall - no treatment as of yet. Relayed to patient and male with him that we have no providers today; discussed options of urgent care, emergency room; may call back for appointment if still needs to schedule.

## 2019-10-28 NOTE — ED Provider Notes (Signed)
[facility]    CSN: [id number] Arrival date & time: 10/28/19  1016      History   Chief Complaint Chief Complaint  Patient presents with  . Hand Pain    HPI Scott Jackson is a 69 y.o. male.   With History of gout presented to the urgent care with a complaint of left wrist pain for the past 3 weeks.  Swelling and warmth noted.  Denies any precipitating event.  Denies fall or injury.  Localizes the pain to the left wrist.  He describes the pain as constant and achy.  He has tried OTC medications without relief.  His symptoms are made worse with ROM.  Reports similar symptoms in the past and was treated with steroid.   The history is provided by the patient and a significant other. No language interpreter was used.  Hand Pain    Past Medical History:  Diagnosis Date  . ETOH abuse   . Hypertension   . Pulmonary nodule   . Tobacco abuse     Patient Active Problem List   Diagnosis Date Noted  . Anorexia nervosa 10/16/2019  . Loss of weight 05/13/2019  . Abnormal CT scan, colon 05/13/2019  . Single seizure ([place]) 04/09/2019  . Adult failure to thrive 04/09/2019  . Alcoholism with alcohol dependence ([place]) 03/19/2019  . Alcoholism associated with dementia ([place]) 03/19/2019  . Emphysema of lung ([place]) 03/19/2019  . Abdominal pain, epigastric   . Hemorrhoid 09/28/2018  . Rectal bleeding 09/28/2018  . Constipation 09/28/2018  . Screen for sexually transmitted diseases 11/24/2017  . Hyperlipidemia 11/24/2017  . HTN, goal below 140/90 11/24/2017  . Screening for viral disease 11/24/2017  . Alcohol abuse 11/24/2017  . Alcohol abuse counseling and surveillance of alcoholic [id number]    Past Surgical History:  Procedure Laterality Date  . ANKLE FRACTURE SURGERY    . APPENDECTOMY    . BIOPSY  01/08/2019   Procedure: BIOPSY;  Surgeon: Danie Binder, MD;  Location: [facility];  Service: Endoscopy;;  gastric  . COLONOSCOPY  01/2015   Dr. Posey Pronto: two 2-7mm rectal  polyps removed, hyperplastic  . COLONOSCOPY  2007   Dr. Posey Pronto: three adenomas removed measuring 4, 10, 40mm. diverticulosis  . COLONOSCOPY WITH PROPOFOL N/A 01/08/2019   Dr. Oneida Alar: Severe diverticulosis with fixed rectosigmoid region, rectal bleeding likely due to internal hemorrhoids.  Recommended repeat colonoscopy at [facility] because examination was incomplete due to fixed rectosigmoid colon (patient never followed through).  . ESOPHAGOGASTRODUODENOSCOPY (EGD) WITH PROPOFOL N/A 01/08/2019   Dr. Oneida Alar: Low-grade narrowing Schatzki ring at the GE junction, medium sized hiatal hernia, erosive gastritis/duodenitis in the setting of NSAID and alcohol use.  Gastric biopsy showed gastropathy but no H. pylori  . intestininal blockage  1970       Home Medications    Prior to Admission medications   Medication Sig Start Date End Date Taking? Authorizing Provider  acetaminophen (TYLENOL 8 HOUR) 650 MG CR tablet Take 1 tablet (650 mg total) by mouth every 8 (eight) hours as needed for pain. 10/28/19   Katey Barrie, Darrelyn Hillock, FNP  amLODipine (NORVASC) 10 MG tablet Take 10 mg by mouth daily. 11/20/18   [provider]  Cyanocobalamin (B-12) 500 MCG SUBL DISSOLVE UNDER TONGUE ONCE DAILY AS DIRECTED 08/30/19   [provider]  lactulose (CHRONULAC) 10 GM/15ML solution Take 30 mLs (20 g total) by mouth 2 (two) times daily. To have 2-3 soft stools daily to keep ammonia  down and stay mentally clear. 10/21/19   Mahala Menghini, PA-C  lisinopril-hydrochlorothiazide (PRINZIDE,ZESTORETIC) 10-12.5 MG tablet Take 1 tablet by mouth every morning. 02/01/18   Caren Macadam, MD  losartan (COZAAR) 50 MG tablet Take 50 mg by mouth daily. 08/29/19   [provider]  Multiple Vitamin (MULTIVITAMIN WITH MINERALS) TABS tablet Take 1 tablet by mouth daily.    [provider]  omeprazole (PRILOSEC) 20 MG capsule Take 20 mg by mouth every morning. 08/30/19   [provider]  PARoxetine  (PAXIL) 40 MG tablet Take 40 mg by mouth every morning.    [provider]  predniSONE (STERAPRED UNI-PAK 21 TAB) 10 MG (21) TBPK tablet Take 6 tabs by mouth daily  for 2 days, then 5 tabs for 2 days, then 4 tabs for 2 days, then 3 tabs for 2 days, 2 tabs for 2 days, then 1 tab by mouth daily for 2 days 10/28/19   Emerson Monte, FNP    Family History Family History  Problem Relation Age of Onset  . Hypertension Mother   . Stroke Mother   . Fibromyalgia Daughter   . Lupus Daughter   . Anxiety disorder Daughter   . Post-traumatic stress disorder Daughter   . Asthma Daughter   . Breast cancer Sister   . Breast cancer Sister   . Colon cancer Neg Hx     Social History Social History   Tobacco Use  . Smoking status: Current Every Day Smoker    Packs/day: 0.25    Years: 50.00    Pack years: 12.50    Types: Cigarettes  . Smokeless tobacco: Never Used  Substance Use Topics  . Alcohol use: Yes    Comment: 5th of liquor   . Drug use: No     Allergies   Patient has no known allergies.   Review of Systems Review of Systems  Constitutional: Negative.   Respiratory: Negative.   Cardiovascular: Negative.   Musculoskeletal: Positive for arthralgias and joint swelling.  Skin: Positive for color change.  All other systems reviewed and are negative.    Physical Exam Triage Vital Signs ED Triage Vitals  Enc Vitals Group     BP 10/28/19 1037 112/76     Pulse Rate 10/28/19 1037 (!) 110     Resp 10/28/19 1037 17     Temp 10/28/19 1037 98.5 F (36.9 C)     Temp Source 10/28/19 1037 Oral     SpO2 10/28/19 1037 97 %     Weight 10/28/19 1038 145 lb (65.8 kg)     Height 10/28/19 1038 6\' 1"  (1.854 m)     Head Circumference --      Peak Flow --      Pain Score 10/28/19 1038 6     Pain Loc --      Pain Edu? --      Excl. in [place]? --    No data found.  Updated Vital Signs BP 112/76 (BP Location: Right Arm)   Pulse (!) 106   Temp 98.5 F (36.9 C) (Oral)   Resp 17    Ht 6\' 1"  (1.854 m)   Wt 145 lb (65.8 kg)   SpO2 97%   BMI 19.13 kg/m   Visual Acuity Right Eye Distance:   Left Eye Distance:   Bilateral Distance:    Right Eye Near:   Left Eye Near:    Bilateral Near:     Physical Exam Vitals and nursing note  reviewed.  Constitutional:      General: He is not in acute distress.    Appearance: Normal appearance. He is normal weight. He is not ill-appearing, toxic-appearing or diaphoretic.  Cardiovascular:     Rate and Rhythm: Normal rate and regular rhythm.     Pulses: Normal pulses.     Heart sounds: Normal heart sounds. No murmur. No gallop.   Pulmonary:     Effort: Pulmonary effort is normal. No respiratory distress.     Breath sounds: Normal breath sounds. No stridor. No wheezing, rhonchi or rales.  Chest:     Chest wall: No tenderness.  Musculoskeletal:        General: Swelling and tenderness present.     Right wrist: Normal.     Left wrist: Swelling and tenderness present. No crepitus.     Comments: Warmth present  Skin:    General: Skin is warm.     Findings: No rash.     Nails: There is no clubbing.  Neurological:     Mental Status: He is alert.      UC Treatments / Results  Labs (all labs ordered are listed, but only abnormal results are displayed) Labs Reviewed - No data to display  EKG   Radiology CT Head Wo Contrast  Result Date: 10/26/2019 CLINICAL DATA:  [place] out of his car 2 days ago. Patient landed on his back and hit the back of his head. Complaining of head and neck pain. EXAM: CT HEAD WITHOUT CONTRAST CT CERVICAL SPINE WITHOUT CONTRAST TECHNIQUE: Multidetector CT imaging of the head and cervical spine was performed following the standard protocol without intravenous contrast. Multiplanar CT image reconstructions of the cervical spine were also generated. COMPARISON:  None. FINDINGS: CT HEAD FINDINGS Brain: No evidence of acute infarction, hemorrhage, hydrocephalus, extra-axial collection or mass lesion/mass  effect. There is ventricular and sulcal enlargement reflecting mild diffuse atrophy, advanced for age. Vascular: No hyperdense vessel or unexpected calcification. Skull: Normal. Negative for fracture or focal lesion. Sinuses/Orbits: Visualized globes and orbits are unremarkable. The visualized sinuses and mastoid air cells are clear. Other: None. CT CERVICAL SPINE FINDINGS Alignment: Normal. Skull base and vertebrae: No acute fracture. No primary bone lesion or focal pathologic process. Soft tissues and spinal canal: No prevertebral fluid or swelling. No visible canal hematoma. Disc levels: Mild loss of disc height at C5-C6 and C6-C7 with mild spondylotic disc bulging. Mild disc bulging with endplate spurring also noted at C4-C5. No convincing disc herniation. Upper chest: No acute findings. Pleuroparenchymal scarring at the lung apices. Other: None. IMPRESSION: HEAD CT 1. No acute intracranial abnormalities.  No skull fracture. 2. Mild atrophy, advanced for age. CERVICAL CT 1. No fracture or acute finding. Electronically Signed   By: Lajean Manes M.D.   On: 10/26/2019 14:06   CT Cervical Spine Wo Contrast  Result Date: 10/26/2019 CLINICAL DATA:  [place] out of his car 2 days ago. Patient landed on his back and hit the back of his head. Complaining of head and neck pain. EXAM: CT HEAD WITHOUT CONTRAST CT CERVICAL SPINE WITHOUT CONTRAST TECHNIQUE: Multidetector CT imaging of the head and cervical spine was performed following the standard protocol without intravenous contrast. Multiplanar CT image reconstructions of the cervical spine were also generated. COMPARISON:  None. FINDINGS: CT HEAD FINDINGS Brain: No evidence of acute infarction, hemorrhage, hydrocephalus, extra-axial collection or mass lesion/mass effect. There is ventricular and sulcal enlargement reflecting mild diffuse atrophy, advanced for age. Vascular: No hyperdense vessel or unexpected  calcification. Skull: Normal. Negative for fracture or focal  lesion. Sinuses/Orbits: Visualized globes and orbits are unremarkable. The visualized sinuses and mastoid air cells are clear. Other: None. CT CERVICAL SPINE FINDINGS Alignment: Normal. Skull base and vertebrae: No acute fracture. No primary bone lesion or focal pathologic process. Soft tissues and spinal canal: No prevertebral fluid or swelling. No visible canal hematoma. Disc levels: Mild loss of disc height at C5-C6 and C6-C7 with mild spondylotic disc bulging. Mild disc bulging with endplate spurring also noted at C4-C5. No convincing disc herniation. Upper chest: No acute findings. Pleuroparenchymal scarring at the lung apices. Other: None. IMPRESSION: HEAD CT 1. No acute intracranial abnormalities.  No skull fracture. 2. Mild atrophy, advanced for age. CERVICAL CT 1. No fracture or acute finding. Electronically Signed   By: Lajean Manes M.D.   On: 10/26/2019 14:06   CT ABDOMEN PELVIS W CONTRAST  Result Date: 10/28/2019 CLINICAL DATA:  Weight loss, decreased appetite, hepatic encephalopathy, incomplete colonoscopy, recent fall, prior appendectomy EXAM: CT ABDOMEN AND PELVIS WITH CONTRAST TECHNIQUE: Multidetector CT imaging of the abdomen and pelvis was performed using the standard protocol following bolus administration of intravenous contrast. CONTRAST:  13mL OMNIPAQUE IOHEXOL 300 MG/ML  SOLN COMPARISON:  None. FINDINGS: Motion degraded images. Lower chest: Lung bases are clear. Hepatobiliary: Liver is grossly unremarkable. Suspected focal fat/altered perfusion along the falciform ligament (series 2/image 28). Gallbladder is unremarkable. No intrahepatic or extrahepatic ductal dilatation. Pancreas: Within normal limits. Spleen: Within normal limits. Adrenals/Urinary Tract: Adrenal glands are within normal limits. Kidneys are within normal limits.  No hydronephrosis. Bladder is mildly thick-walled although underdistended. Stomach/Bowel: Stomach is within normal limits. No evidence of bowel obstruction.  Prior appendectomy. Mild rectal wall thickening with perirectal stranding (series 2/image 74), suggesting infectious/inflammatory proctitis. Vascular/Lymphatic: No evidence of abdominal aortic aneurysm. Atherosclerotic calcifications of the abdominal aorta and branch vessels. No suspicious abdominopelvic lymphadenopathy. Reproductive: Prostate is unremarkable. Other: No abdominopelvic ascites. Musculoskeletal: Mild degenerative changes of the bilateral hips. Degenerative changes of the lumbar spine. No fracture is seen. IMPRESSION: Motion degraded images. Mild rectal wall thickening with perirectal stranding, suggesting infectious inflammatory proctitis. Consider colonoscopy/flexible sigmoidoscopy for further evaluation. Otherwise unremarkable CT abdomen/pelvis. Electronically Signed   By: Julian Hy M.D.   On: 10/28/2019 09:16    Procedures Procedures (including critical care time)  Medications Ordered in UC Medications - No data to display  Initial Impression / Assessment and Plan / UC Course  I have reviewed the triage vital signs and the nursing notes.  Pertinent labs & imaging results that were available during my care of the patient were reviewed by me and considered in my medical decision making (see chart for details).    Patient is stable for discharge. Symptoms and presentation are more likely from gout. Prednisone taper and Tylenol were prescribed. Advised to follow-up with PCP Advised for low purine diet Return or go to ED for worsening symptoms  Final Clinical Impressions(s) / UC Diagnoses   Final diagnoses:  Acute gout of left wrist, unspecified cause     Discharge Instructions     Prescribed prednisone/ take as directed and to completion Prescribed Tylenol take as directed Advised to follow low purine diet Follow up with PCP for further evaluation and management Return or go to the ED if you have any new or worsening symptoms     ED Prescriptions     Medication Sig Dispense Auth. Provider   predniSONE (STERAPRED UNI-PAK 21 TAB) 10 MG (21) TBPK tablet Take  6 tabs by mouth daily  for 2 days, then 5 tabs for 2 days, then 4 tabs for 2 days, then 3 tabs for 2 days, 2 tabs for 2 days, then 1 tab by mouth daily for 2 days 42 tablet Lymon Kidney, Darrelyn Hillock, FNP   acetaminophen (TYLENOL 8 HOUR) 650 MG CR tablet Take 1 tablet (650 mg total) by mouth every 8 (eight) hours as needed for pain. 30 tablet Sila Sarsfield, Darrelyn Hillock, FNP     PDMP not reviewed this encounter.   Emerson Monte, FNP 10/28/19 1057

## 2019-10-28 NOTE — Discharge Instructions (Addendum)
Prescribed prednisone/ take as directed and to completion Prescribed Tylenol take as directed Advised to follow low purine diet Follow up with PCP for further evaluation and management Return or go to the ED if you have any new or worsening symptoms

## 2019-11-07 ENCOUNTER — Encounter: Payer: Self-pay | Admitting: Emergency Medicine

## 2019-12-17 ENCOUNTER — Other Ambulatory Visit: Payer: Self-pay

## 2019-12-17 ENCOUNTER — Encounter (HOSPITAL_COMMUNITY): Payer: Self-pay

## 2019-12-17 ENCOUNTER — Emergency Department (HOSPITAL_COMMUNITY)
Admission: EM | Admit: 2019-12-17 | Discharge: 2019-12-17 | Disposition: A | Discharge status: Home / Self Care | Payer: Medicare HMO | Attending: Emergency Medicine | Admitting: Emergency Medicine

## 2019-12-17 ENCOUNTER — Emergency Department (HOSPITAL_COMMUNITY): Payer: Medicare HMO

## 2019-12-17 DIAGNOSIS — M109 Gout, unspecified: Secondary | ICD-10-CM | POA: Diagnosis not present

## 2019-12-17 DIAGNOSIS — F17210 Nicotine dependence, cigarettes, uncomplicated: Secondary | ICD-10-CM | POA: Insufficient documentation

## 2019-12-17 DIAGNOSIS — M79642 Pain in left hand: Secondary | ICD-10-CM | POA: Diagnosis not present

## 2019-12-17 DIAGNOSIS — Z79899 Other long term (current) drug therapy: Secondary | ICD-10-CM | POA: Diagnosis not present

## 2019-12-17 DIAGNOSIS — M7989 Other specified soft tissue disorders: Secondary | ICD-10-CM | POA: Diagnosis not present

## 2019-12-17 DIAGNOSIS — R69 Illness, unspecified: Secondary | ICD-10-CM | POA: Diagnosis not present

## 2019-12-17 DIAGNOSIS — I10 Essential (primary) hypertension: Secondary | ICD-10-CM | POA: Diagnosis not present

## 2019-12-17 DIAGNOSIS — M25532 Pain in left wrist: Secondary | ICD-10-CM | POA: Diagnosis present

## 2019-12-17 LAB — BASIC METABOLIC PANEL
Anion gap: 19 — ABNORMAL HIGH (ref 5–15)
BUN: 18 mg/dL (ref 8–23)
CO2: 22 mmol/L (ref 22–32)
Calcium: 9.7 mg/dL (ref 8.9–10.3)
Chloride: 94 mmol/L — ABNORMAL LOW (ref 98–111)
Creatinine, Ser: 1.07 mg/dL (ref 0.61–1.24)
GFR calc Af Amer: >60 mL/min (ref >60)
GFR calc non Af Amer: >60 mL/min (ref >60)
Glucose, Bld: 109 mg/dL — ABNORMAL HIGH (ref 70–99)
Potassium: 3 mmol/L — ABNORMAL LOW (ref 3.5–5.1)
Sodium: 135 mmol/L (ref 135–145)

## 2019-12-17 LAB — CBC WITH DIFFERENTIAL/PLATELET
Abs Immature Granulocytes: 0.02 10*3/uL (ref 0.00–0.07)
Basophils Absolute: 0 10*3/uL (ref 0.0–0.1)
Basophils Relative: 0 %
Eosinophils Absolute: 0 10*3/uL (ref 0.0–0.5)
Eosinophils Relative: 0 %
HCT: 35.5 % — ABNORMAL LOW (ref 39.0–52.0)
Hemoglobin: 10.9 g/dL — ABNORMAL LOW (ref 13.0–17.0)
Immature Granulocytes: 0 %
Lymphocytes Relative: 16 %
Lymphs Abs: 1.4 10*3/uL (ref 0.7–4.0)
MCH: 30.8 pg (ref 26.0–34.0)
MCHC: 30.7 g/dL (ref 30.0–36.0)
MCV: 100.3 fL — ABNORMAL HIGH (ref 80.0–100.0)
Monocytes Absolute: 0.8 10*3/uL (ref 0.1–1.0)
Monocytes Relative: 9 %
Neutro Abs: 6.5 10*3/uL (ref 1.7–7.7)
Neutrophils Relative %: 75 %
Platelets: 389 10*3/uL (ref 150–400)
RBC: 3.54 MIL/uL — ABNORMAL LOW (ref 4.22–5.81)
RDW: 15.2 % (ref 11.5–15.5)
WBC: 8.7 10*3/uL (ref 4.0–10.5)
nRBC: 0 % (ref 0.0–0.2)

## 2019-12-17 LAB — SEDIMENTATION RATE: Sed Rate: 104 mm/hr — ABNORMAL HIGH (ref 0–16)

## 2019-12-17 LAB — C-REACTIVE PROTEIN: CRP: 19.9 mg/dL — ABNORMAL HIGH (ref <1.0)

## 2019-12-17 MED ORDER — PREDNISONE 20 MG PO TABS: 40.0000 mg | ORAL_TABLET | Freq: Every day | ORAL | 0 refills | Status: AC

## 2019-12-17 MED ORDER — ACETAMINOPHEN ER 650 MG PO TBCR: 650.0000 mg | EXTENDED_RELEASE_TABLET | Freq: Three times a day (TID) | ORAL | 0 refills | Status: DC | PRN

## 2019-12-17 MED ORDER — ACETAMINOPHEN 325 MG PO TABS
650.0000 mg | ORAL_TABLET | Freq: Once | ORAL | Status: AC
  Administered 2019-12-17: 650 mg via ORAL
  Filled 2019-12-17: qty 2

## 2019-12-17 MED ORDER — POTASSIUM CHLORIDE CRYS ER 20 MEQ PO TBCR
40.0000 meq | EXTENDED_RELEASE_TABLET | Freq: Once | ORAL | Status: AC
  Administered 2019-12-17: 40 meq via ORAL
  Filled 2019-12-17: qty 2

## 2019-12-17 NOTE — Discharge Instructions (Addendum)
As discussed, I have high suspicion that your wrist/hand pain is related to gout; however, I cannot rule out infection. I am sending you home with steroids and pain medication. Take as prescribed. I recommend reevaluation within the next 24-48 hours. Return to the ER if you develop a fever or worsening of symptoms after being on the medication for 24 hours. Return to the ER for new or worsening symptoms.

## 2019-12-17 NOTE — ED Provider Notes (Signed)
[facility] EMERGENCY DEPARTMENT Provider Note   CSN: [id number] Arrival date & time: 12/17/19  1434     History Chief Complaint  Patient presents with  . Hand Pain    Scott Jackson is a 69 y.o. male with a past medical history significant for alcohol abuse, hypertension, tobacco abuse, and history of gout who presents to the ED due to gradual onset of worsening left hand and wrist pain.  Patient notes pain is worse with palpation and movement of wrist.  Pain associated with edema of the left hand and wrist.  Denies direct injury to wrist.  Notes he has been taking his "gout pills" with mild relief.  Patient states this feels similar to his previous gout flare.  Denies fever and chills.  Denies IV drug use.  Denies numbness and tingling of left upper extremity.  Chart reviewed.  Patient was seen at urgent care on 10/28/2019 for gout of the left wrist and prescribed prednisone and Tylenol.  Patient notes his symptoms improved after his urgent care visit however reappeared roughly 1 week ago.  History obtained from patient and past medical records. No interpreter used during encounter.      Past Medical History:  Diagnosis Date  . ETOH abuse   . Hypertension   . Pulmonary nodule   . Tobacco abuse     Patient Active Problem List   Diagnosis Date Noted  . Anorexia nervosa 10/16/2019  . Loss of weight 05/13/2019  . Abnormal CT scan, colon 05/13/2019  . Single seizure ([place]) 04/09/2019  . Adult failure to thrive 04/09/2019  . Alcoholism with alcohol dependence ([place]) 03/19/2019  . Alcoholism associated with dementia ([place]) 03/19/2019  . Emphysema of lung ([place]) 03/19/2019  . Abdominal pain, epigastric   . Hemorrhoid 09/28/2018  . Rectal bleeding 09/28/2018  . Constipation 09/28/2018  . Screen for sexually transmitted diseases 11/24/2017  . Hyperlipidemia 11/24/2017  . HTN, goal below 140/90 11/24/2017  . Screening for viral disease 11/24/2017  . Alcohol abuse 11/24/2017  . Alcohol  abuse counseling and surveillance of alcoholic 62/13/0865    Past Surgical History:  Procedure Laterality Date  . ANKLE FRACTURE SURGERY    . APPENDECTOMY    . BIOPSY  01/08/2019   Procedure: BIOPSY;  Surgeon: Danie Binder, MD;  Location: [facility];  Service: Endoscopy;;  gastric  . COLONOSCOPY  01/2015   Dr. Posey Pronto: two 2-28m rectal polyps removed, hyperplastic  . COLONOSCOPY  2007   Dr. PPosey Pronto three adenomas removed measuring 4, 10, 162m diverticulosis  . COLONOSCOPY WITH PROPOFOL N/A 01/08/2019   Dr. FiOneida AlarSevere diverticulosis with fixed rectosigmoid region, rectal bleeding likely due to internal hemorrhoids.  [facility] colon (patient never followed through).  . ESOPHAGOGASTRODUODENOSCOPY (EGD) WITH PROPOFOL N/A 01/08/2019   Dr. FiOneida AlarLow-grade narrowing Schatzki ring at the GE junction, medium sized hiatal hernia, erosive gastritis/duodenitis in the setting of NSAID and alcohol use.  Gastric biopsy showed gastropathy but no H. pylori  . intestininal blockage  1970       Family History  Problem Relation Age of Onset  . Hypertension Mother   . Stroke Mother   . Fibromyalgia Daughter   . Lupus Daughter   . Anxiety disorder Daughter   . Post-traumatic stress disorder Daughter   . Asthma Daughter   . Breast cancer Sister   . Breast cancer Sister   . Colon cancer Neg Hx  Social History   Tobacco Use  . Smoking status: Current Every Day Smoker    Packs/day: 0.25    Years: 50.00    Pack years: 12.50    Types: Cigarettes  . Smokeless tobacco: Never Used  Substance Use Topics  . Alcohol use: Yes    Comment: 5th of liquor   . Drug use: No    Home Medications Prior to Admission medications   Medication Sig Start Date End Date Taking? Authorizing Provider  acetaminophen (TYLENOL 8 HOUR) 650 MG CR tablet Take 1 tablet (650 mg total) by mouth every 8 (eight) hours as  needed for pain. 10/28/19   Avegno, Darrelyn Hillock, FNP  acetaminophen (TYLENOL 8 HOUR) 650 MG CR tablet Take 1 tablet (650 mg total) by mouth every 8 (eight) hours as needed for pain. 12/17/19   Suzy Bouchard, PA-C  amLODipine (NORVASC) 10 MG tablet Take 10 mg by mouth daily. 11/20/18   [provider]  Cyanocobalamin (B-12) 500 MCG SUBL DISSOLVE UNDER TONGUE ONCE DAILY AS DIRECTED 08/30/19   [provider]  lactulose (CHRONULAC) 10 GM/15ML solution Take 30 mLs (20 g total) by mouth 2 (two) times daily. To have 2-3 soft stools daily to keep ammonia down and stay mentally clear. 10/21/19   Mahala Menghini, PA-C  lisinopril-hydrochlorothiazide (PRINZIDE,ZESTORETIC) 10-12.5 MG tablet Take 1 tablet by mouth every morning. 02/01/18   Caren Macadam, MD  losartan (COZAAR) 50 MG tablet Take 50 mg by mouth daily. 08/29/19   [provider]  Multiple Vitamin (MULTIVITAMIN WITH MINERALS) TABS tablet Take 1 tablet by mouth daily.    [provider]  omeprazole (PRILOSEC) 20 MG capsule Take 20 mg by mouth every morning. 08/30/19   [provider]  PARoxetine (PAXIL) 40 MG tablet Take 40 mg by mouth every morning.    [provider]  predniSONE (DELTASONE) 20 MG tablet Take 2 tablets (40 mg total) by mouth daily for 5 days. 12/17/19 12/22/19  Suzy Bouchard, PA-C  predniSONE (STERAPRED UNI-PAK 21 TAB) 10 MG (21) TBPK tablet Take 6 tabs by mouth daily  for 2 days, then 5 tabs for 2 days, then 4 tabs for 2 days, then 3 tabs for 2 days, 2 tabs for 2 days, then 1 tab by mouth daily for 2 days 10/28/19   Emerson Monte, FNP    Allergies    Patient has no known allergies.  Review of Systems   Review of Systems  Constitutional: Negative for chills and fever.  Musculoskeletal: Positive for arthralgias (left wrist/hand).  Neurological: Negative for numbness.  All other systems reviewed and are negative.   Physical Exam Updated Vital Signs BP 118/75   Pulse 100    Temp 98.1 F (36.7 C) (Oral)   Resp 18   Wt 68 kg   SpO2 99%   BMI 19.79 kg/m   Physical Exam Vitals and nursing note reviewed.  Constitutional:      General: He is not in acute distress.    Appearance: He is not ill-appearing.  HENT:     Head: Normocephalic.  Eyes:     Pupils: Pupils are equal, round, and reactive to light.  Cardiovascular:     Rate and Rhythm: Normal rate and regular rhythm.     Pulses: Normal pulses.     Heart sounds: Normal heart sounds. No murmur. No friction rub. No gallop.   Pulmonary:     Effort: Pulmonary effort is normal.     Breath  sounds: Normal breath sounds.  Abdominal:     General: Abdomen is flat. There is no distension.     Palpations: Abdomen is soft.     Tenderness: There is no abdominal tenderness. There is no guarding or rebound.  Musculoskeletal:     Cervical back: Neck supple.     Comments: Tenderness throughout left wrist with overlying edema and warmth. Edema of dorsal aspect of left hand. Very limited ROM of left wrist. Radial pulse intact.   Skin:    General: Skin is warm and dry.  Neurological:     General: No focal deficit present.     Mental Status: He is alert.  Psychiatric:        Mood and Affect: Mood normal.        Behavior: Behavior normal.     ED Results / Procedures / Treatments   Labs (all labs ordered are listed, but only abnormal results are displayed) Labs Reviewed  CBC WITH DIFFERENTIAL/PLATELET - Abnormal; Notable for the following components:      Result Value   RBC 3.54 (*)    Hemoglobin 10.9 (*)    HCT 35.5 (*)    MCV 100.3 (*)    All other components within normal limits  SEDIMENTATION RATE - Abnormal; Notable for the following components:   Sed Rate 104 (*)    All other components within normal limits  BASIC METABOLIC PANEL - Abnormal; Notable for the following components:   Potassium 3.0 (*)    Chloride 94 (*)    Glucose, Bld 109 (*)    Anion gap 19 (*)    All other components within  normal limits  C-REACTIVE PROTEIN - Abnormal; Notable for the following components:   CRP 19.9 (*)    All other components within normal limits    EKG None  Radiology DG Wrist Complete Left  Result Date: 12/17/2019 CLINICAL DATA:  Left hand pain and swelling. EXAM: LEFT WRIST - COMPLETE 3+ VIEW COMPARISON:  None. FINDINGS: There is no evidence of an acute fracture or dislocation. A very small chronic deformity is seen involving the proximal aspect of the fifth left metacarpal. Mild degenerative changes are seen involving the carpometacarpal articulation of the left thumb. There is moderate severity diffuse soft tissue swelling. This is more prominent along the dorsal aspect of the left hand and left wrist. IMPRESSION: Moderate severity diffuse soft tissue swelling without evidence of an acute fracture. Electronically Signed   By: Virgina Norfolk M.D.   On: 12/17/2019 17:08   DG Hand Complete Left  Result Date: 12/17/2019 CLINICAL DATA:  Left hand pain and swelling. EXAM: LEFT HAND - COMPLETE 3+ VIEW COMPARISON:  None. FINDINGS: There is no evidence of an acute fracture or dislocation. Mild degenerative changes seen involving the carpometacarpal articulation of the left thumb. There is moderate to marked severity, predominately dorsal soft tissue swelling involving the left hand and left wrist. IMPRESSION: Soft tissue swelling without evidence of acute fracture. Electronically Signed   By: Virgina Norfolk M.D.   On: 12/17/2019 17:12    Procedures Procedures (including critical care time)  Medications Ordered in ED Medications  acetaminophen (TYLENOL) tablet 650 mg (650 mg Oral Given 12/17/19 1850)  potassium chloride SA (KLOR-CON) CR tablet 40 mEq (40 mEq Oral Given 12/17/19 1850)    ED Course  I have reviewed the triage vital signs and the nursing notes.  Pertinent labs & imaging results that were available during my care of the patient were reviewed  by me and considered in my  medical decision making (see chart for details).  Clinical Course as of Dec 16 1899  Tue Dec 17, 2019  1656 WBC: 8.7 [CA]  1808 Potassium(!): 3.0 [CA]  1812 CRP(!): 19.9 [CA]  1849 Patient unable to give urine sample. Daughter at bedside and notes that patient has an appointment with NP on Friday and will get urine checked then.    [CA]    Clinical Course User Index [CA] Karie Kirks   MDM Rules/Calculators/A&P                     69 year old male presents to the ED due to left wrist/hand pain and edema that has progressively gotten worse over the past week.  History of gout.  Patient notes this feels similar to past flares.  Denies fever and chills.  Denies IV drug use. Stable vitals. Patient afebrile. Triage noted patient to be tachycardic; however, during my initial evaluation patient with normal HR in the 90s. Patient in no acute distress and non-toxic appearing. Tenderness throughout dorsal aspect of left wrist/hand with overlying edema and warmth. Very limited ROM of left wrist. Radial pulse intact. Soft compartments. Will obtain routine labs, ESR, and CRP to rule out infectious etiology. Suspect symptoms related to gout vs. Septic arthritis. Will obtain x-rays to rule out acute abnormalities.  Hand and wrist x-rays personally reviewed which demonstrates moderate amount of soft tissue edema.  No bony fractures.  CBC reassuring with no leukocytosis.  BMP significant for hypokalemia at 3.0, but otherwise reassuring. Potassium repleted here in the ED. ESR and CRP elevated. Given patient has been afebrile and he notes this feels similar to his last gout flare that improved on prednisone with treat as gout flare. Discussed concerns at length with daughter and patient at bedside in regards to unable to rule out infection without tap of the joint. Advised patient to have a recheck in 24-48 hours and to return to the ER if he develops a fever or worsening of symptoms after being on  prednisone for 24 hours. Patient and daughter agreeable to plan. Strict ED precautions discussed with patient. Patient states understanding and agrees to plan. Patient discharged home in no acute distress and stable vitals  Discussed case with Dr. Tomi Bamberger who evaluated patient at bedside who agrees with assessment and plan.  Final Clinical Impression(s) / ED Diagnoses Final diagnoses:  Acute gout of left wrist, unspecified cause    Rx / DC Orders ED Discharge Orders         Ordered    predniSONE (DELTASONE) 20 MG tablet  Daily     12/17/19 1853    acetaminophen (TYLENOL 8 HOUR) 650 MG CR tablet  Every 8 hours PRN     12/17/19 1853           Karie Kirks 12/17/19 1904    Dorie Rank, MD 12/18/19 2017

## 2019-12-17 NOTE — ED Triage Notes (Signed)
Pt complaining of left hand swelling and pain. States hand swells periodically r/t gout. Pt reports taking gout medication as prescribed at home.

## 2019-12-19 ENCOUNTER — Other Ambulatory Visit: Payer: Self-pay | Admitting: *Deleted

## 2019-12-19 DIAGNOSIS — F17210 Nicotine dependence, cigarettes, uncomplicated: Secondary | ICD-10-CM

## 2019-12-19 DIAGNOSIS — Z87891 Personal history of nicotine dependence: Secondary | ICD-10-CM

## 2019-12-27 DIAGNOSIS — Z125 Encounter for screening for malignant neoplasm of prostate: Secondary | ICD-10-CM | POA: Diagnosis not present

## 2019-12-27 DIAGNOSIS — M109 Gout, unspecified: Secondary | ICD-10-CM | POA: Diagnosis not present

## 2019-12-27 DIAGNOSIS — E538 Deficiency of other specified B group vitamins: Secondary | ICD-10-CM | POA: Diagnosis not present

## 2019-12-27 DIAGNOSIS — D649 Anemia, unspecified: Secondary | ICD-10-CM | POA: Diagnosis not present

## 2019-12-27 DIAGNOSIS — E785 Hyperlipidemia, unspecified: Secondary | ICD-10-CM | POA: Diagnosis not present

## 2019-12-27 DIAGNOSIS — I10 Essential (primary) hypertension: Secondary | ICD-10-CM | POA: Diagnosis not present

## 2019-12-27 DIAGNOSIS — E876 Hypokalemia: Secondary | ICD-10-CM | POA: Diagnosis not present

## 2019-12-27 DIAGNOSIS — R69 Illness, unspecified: Secondary | ICD-10-CM | POA: Diagnosis not present

## 2019-12-27 DIAGNOSIS — Z720 Tobacco use: Secondary | ICD-10-CM | POA: Diagnosis not present

## 2020-02-19 DIAGNOSIS — R69 Illness, unspecified: Secondary | ICD-10-CM | POA: Diagnosis not present

## 2020-02-19 DIAGNOSIS — I10 Essential (primary) hypertension: Secondary | ICD-10-CM | POA: Diagnosis not present

## 2020-02-19 DIAGNOSIS — E785 Hyperlipidemia, unspecified: Secondary | ICD-10-CM | POA: Diagnosis not present

## 2020-02-19 DIAGNOSIS — J439 Emphysema, unspecified: Secondary | ICD-10-CM | POA: Diagnosis not present

## 2020-03-07 DIAGNOSIS — W19XXXA Unspecified fall, initial encounter: Secondary | ICD-10-CM | POA: Diagnosis not present

## 2020-03-07 DIAGNOSIS — R531 Weakness: Secondary | ICD-10-CM | POA: Diagnosis not present

## 2020-03-26 DIAGNOSIS — J439 Emphysema, unspecified: Secondary | ICD-10-CM | POA: Diagnosis not present

## 2020-03-26 DIAGNOSIS — I10 Essential (primary) hypertension: Secondary | ICD-10-CM | POA: Diagnosis not present

## 2020-03-26 DIAGNOSIS — E785 Hyperlipidemia, unspecified: Secondary | ICD-10-CM | POA: Diagnosis not present

## 2020-03-26 DIAGNOSIS — R69 Illness, unspecified: Secondary | ICD-10-CM | POA: Diagnosis not present

## 2020-04-22 DIAGNOSIS — G4089 Other seizures: Secondary | ICD-10-CM | POA: Diagnosis not present

## 2020-04-22 DIAGNOSIS — R5381 Other malaise: Secondary | ICD-10-CM | POA: Diagnosis not present

## 2020-04-22 DIAGNOSIS — F1021 Alcohol dependence, in remission: Secondary | ICD-10-CM | POA: Diagnosis not present

## 2020-04-22 DIAGNOSIS — R69 Illness, unspecified: Secondary | ICD-10-CM | POA: Diagnosis not present

## 2020-04-22 DIAGNOSIS — R404 Transient alteration of awareness: Secondary | ICD-10-CM | POA: Diagnosis not present

## 2020-04-22 DIAGNOSIS — R402411 Glasgow coma scale score 13-15, in the field [EMT or ambulance]: Secondary | ICD-10-CM | POA: Diagnosis not present

## 2020-04-22 DIAGNOSIS — M25512 Pain in left shoulder: Secondary | ICD-10-CM | POA: Diagnosis not present

## 2020-04-22 DIAGNOSIS — R0902 Hypoxemia: Secondary | ICD-10-CM | POA: Diagnosis not present

## 2020-04-22 DIAGNOSIS — F17211 Nicotine dependence, cigarettes, in remission: Secondary | ICD-10-CM | POA: Diagnosis not present

## 2020-04-22 DIAGNOSIS — R569 Unspecified convulsions: Secondary | ICD-10-CM | POA: Diagnosis not present

## 2020-04-22 DIAGNOSIS — M542 Cervicalgia: Secondary | ICD-10-CM | POA: Diagnosis not present

## 2020-05-05 DIAGNOSIS — Z720 Tobacco use: Secondary | ICD-10-CM | POA: Diagnosis not present

## 2020-05-05 DIAGNOSIS — I10 Essential (primary) hypertension: Secondary | ICD-10-CM | POA: Diagnosis not present

## 2020-05-05 DIAGNOSIS — R569 Unspecified convulsions: Secondary | ICD-10-CM | POA: Diagnosis not present

## 2020-05-05 DIAGNOSIS — Z0000 Encounter for general adult medical examination without abnormal findings: Secondary | ICD-10-CM | POA: Diagnosis not present

## 2020-05-05 DIAGNOSIS — J439 Emphysema, unspecified: Secondary | ICD-10-CM | POA: Diagnosis not present

## 2020-05-05 DIAGNOSIS — I700 Atherosclerosis of aorta: Secondary | ICD-10-CM | POA: Diagnosis not present

## 2020-05-05 DIAGNOSIS — R8290 Unspecified abnormal findings in urine: Secondary | ICD-10-CM | POA: Diagnosis not present

## 2020-05-05 DIAGNOSIS — Z23 Encounter for immunization: Secondary | ICD-10-CM | POA: Diagnosis not present

## 2020-05-05 DIAGNOSIS — R296 Repeated falls: Secondary | ICD-10-CM | POA: Diagnosis not present

## 2020-05-05 DIAGNOSIS — R69 Illness, unspecified: Secondary | ICD-10-CM | POA: Diagnosis not present

## 2020-05-07 ENCOUNTER — Encounter: Payer: Self-pay | Admitting: Neurology

## 2020-05-07 ENCOUNTER — Ambulatory Visit: Payer: Medicare HMO | Admitting: Neurology

## 2020-05-07 ENCOUNTER — Other Ambulatory Visit: Payer: Self-pay

## 2020-05-07 VITALS — BP 106/80 | HR 101 | Ht 73.0 in | Wt 125.0 lb

## 2020-05-07 DIAGNOSIS — G40909 Epilepsy, unspecified, not intractable, without status epilepticus: Secondary | ICD-10-CM | POA: Diagnosis not present

## 2020-05-07 DIAGNOSIS — F10288 Alcohol dependence with other alcohol-induced disorder: Secondary | ICD-10-CM

## 2020-05-07 DIAGNOSIS — R69 Illness, unspecified: Secondary | ICD-10-CM | POA: Diagnosis not present

## 2020-05-07 MED ORDER — LEVETIRACETAM 500 MG PO TABS: 500.0000 mg | ORAL_TABLET | Freq: Two times a day (BID) | ORAL | 3 refills | Status: DC

## 2020-05-07 MED ORDER — LACTULOSE 10 GM/15ML PO SOLN: 20.0000 g | Freq: Two times a day (BID) | ORAL | 0 refills | Status: DC

## 2020-05-07 MED ORDER — GABAPENTIN 100 MG PO CAPS: ORAL_CAPSULE | ORAL | 5 refills | Status: DC

## 2020-05-07 NOTE — Progress Notes (Signed)
Provider:  Larey Seat, M D  Referring Provider: Caren Macadam, MD Primary Care Physician:  Caren Macadam, MD  Chief Complaint  Patient presents with  . Follow-up    pt with daughter, following up post hospital stay in sept 2021 after having several seizures back to back. majority of these have happened after stopping drinking. he attemptes to quit smoking and when he smokes he will become light headed and pass out or look like going to pass out.     HPI: 05-07-2020.  Scott Jackson is a 69 y.o. male of african - american descent , right handed , is seen here upon revisit  from Dr. Caren Macadam for an evaluation of recurrent seizure activity- leading to ED -Visit , not hospitalization.  Scott Jackson is seen here today on 07 May 2020 in the presence of a family member.  He was reportedly presenting to the ED in [place] in mid September after having suffered several seizures at home while in the shower. Scott Jackson wife had called their daughter to help her as she witnessed a seizure.  Scott Jackson eyes were open but rolled back he could not respond to verbal or tactile stimuli, he seems to have not come back to baseline when he went into another tonic-clonic extension of his body.  A whole body generalized tonic clonic seizure followed he became very pale, his head and neck were turned to the right , his eyes were rolled up,  and shortly after this generalized seizure took place he had to vomit violently.  EMS was called and witnessed yet another seizure and on the way to the emergency room. We have no ED records- and no summary for the patient, either. His daughter stated he started on a seizure medication. Now for recurrent seizures , Keppra 500 mg bid po.  CT was repeated, ECG and labs were done- no record of results.  Alcohol cessation-he has not been able to stay sober-  not clear if any tox screen was done.   His recent EEG was abnormal.  He has a history of alcohol and  tobacco abuse, malnourishment, emphysema and atherosclerosis.   On 01-30-2019 Scott Jackson presented to the Emergency Room at [facility], after he had collapsed and lost awareness of his surroundings. He had been at the home of a friend, meeting others on the porch- he consumed alcohol . He felt hot and shaky before he passed out. There had been no primary witness account made available but EMS was called and reportedly witnessed some seconds of tonic clonic activity, and he was clammy.  Eyes open but turned up.  Unknown how long he was out. He had consumed alcohol. He had been smoking. He doesn't drink water.  He is on antianxiety and hypertension medication.   No previous seizures.   Scott Jackson is hard to understand, appears jittery. repetitive and tangential- daughter concerned about dementia.  He was dehydrated when in ED-and had hyponatremia, hypochloremia and elevated creatinine and tested negative for opiates, cocaine etc.  Alcohol test negative. He is one of 7 siblings and takes care of his 63 year old mother.   Review of Systems: Out of a complete 14 system review, the patient complains of only the following symptoms, and all other reviewed systems are negative.  Jittery, trembling, dysarthria and balance. Sweating a lot - when not active.   Multiple seizures.    Social History   Socioeconomic History  .  Marital status: Legally Separated    Spouse name: Not on file  . Number of children: Not on file  . Years of education: Not on file  . Highest education level: Not on file  Occupational History  . Not on file  Tobacco Use  . Smoking status: Current Every Day Smoker    Packs/day: 0.25    Years: 50.00    Pack years: 12.50    Types: Cigarettes  . Smokeless tobacco: Never Used  Vaping Use  . Vaping Use: Never used  Substance and Sexual Activity  . Alcohol use: Yes    Comment: 5th of liquor   . Drug use: No  . Sexual activity: Yes  Other Topics Concern  . Not  on file  Social History Narrative   Works at [employer] in [place], [place].   Separated.   [place], [place].    Social Determinants of Health   Financial Resource Strain:   . Difficulty of Paying Living Expenses: Not on file  Food Insecurity:   . Worried About Charity fundraiser in the Last Year: Not on file  . Ran Out of Food in the Last Year: Not on file  Transportation Needs:   . Lack of Transportation (Medical): Not on file  . Lack of Transportation (Non-Medical): Not on file  Physical Activity:   . Days of Exercise per Week: Not on file  . Minutes of Exercise per Session: Not on file  Stress:   . Feeling of Stress : Not on file  Social Connections:   . Frequency of Communication with Friends and Family: Not on file  . Frequency of Social Gatherings with Friends and Family: Not on file  . Attends Religious Services: Not on file  . Active Member of Clubs or Organizations: Not on file  . Attends Archivist Meetings: Not on file  . Marital Status: Not on file  Intimate Partner Violence:   . Fear of Current or Ex-Partner: Not on file  . Emotionally Abused: Not on file  . Physically Abused: Not on file  . Sexually Abused: Not on file    Family History  Problem Relation Age of Onset  . Hypertension Mother   . Stroke Mother   . Fibromyalgia Daughter   . Lupus Daughter   . Anxiety disorder Daughter   . Post-traumatic stress disorder Daughter   . Asthma Daughter   . Breast cancer Sister   . Breast cancer Sister   . Colon cancer Neg Hx     Past Medical History:  Diagnosis Date  . ETOH abuse   . Hypertension   . Pulmonary nodule   . Tobacco abuse     Past Surgical History:  Procedure Laterality Date  . ANKLE FRACTURE SURGERY    . APPENDECTOMY    . BIOPSY  01/08/2019   Procedure: BIOPSY;  Surgeon: Danie Binder, MD;  [facility]: [facility];  Service: Endoscopy;;  gastric  . COLONOSCOPY  01/2015   Dr. Posey Pronto: two 2-56mm rectal polyps removed,  hyperplastic  . COLONOSCOPY  2007   Dr. Posey Pronto: three adenomas removed measuring 4, 10, 39mm. diverticulosis  . COLONOSCOPY WITH PROPOFOL N/A 01/08/2019   Dr. Oneida Alar: Severe diverticulosis with fixed rectosigmoid region, rectal bleeding likely due to internal hemorrhoids.  Recommended repeat colonoscopy at [facility] because examination was incomplete due to fixed rectosigmoid colon (patient never followed through).  . ESOPHAGOGASTRODUODENOSCOPY (EGD) WITH PROPOFOL N/A 01/08/2019   Dr. Oneida Alar: Low-grade narrowing Schatzki ring at  the GE junction, medium sized hiatal hernia, erosive gastritis/duodenitis in the setting of NSAID and alcohol use.  Gastric biopsy showed gastropathy but no H. pylori  . intestininal blockage  1970    Current Outpatient Medications  Medication Sig Dispense Refill  . acetaminophen (TYLENOL 8 HOUR) 650 MG CR tablet Take 1 tablet (650 mg total) by mouth every 8 (eight) hours as needed for pain. 30 tablet 0  . acetaminophen (TYLENOL 8 HOUR) 650 MG CR tablet Take 1 tablet (650 mg total) by mouth every 8 (eight) hours as needed for pain. 15 tablet 0  . amLODipine (NORVASC) 10 MG tablet Take 10 mg by mouth daily.    . Cyanocobalamin (B-12) 500 MCG SUBL DISSOLVE UNDER TONGUE ONCE DAILY AS DIRECTED    . lactulose (CHRONULAC) 10 GM/15ML solution Take 30 mLs (20 g total) by mouth 2 (two) times daily. To have 2-3 soft stools daily to keep ammonia down and stay mentally clear. 1892 mL 0  . levETIRAcetam (KEPPRA) 500 MG tablet Take 500 mg by mouth daily.    Marland Kitchen lisinopril-hydrochlorothiazide (PRINZIDE,ZESTORETIC) 10-12.5 MG tablet Take 1 tablet by mouth every morning. 90 tablet 1  . losartan (COZAAR) 50 MG tablet Take 50 mg by mouth daily.    . Multiple Vitamin (MULTIVITAMIN WITH MINERALS) TABS tablet Take 1 tablet by mouth daily.    Marland Kitchen omeprazole (PRILOSEC) 20 MG capsule Take 20 mg by mouth every morning.    Marland Kitchen PARoxetine (PAXIL) 40 MG tablet Take 40 mg by mouth every morning.    .  predniSONE (STERAPRED UNI-PAK 21 TAB) 10 MG (21) TBPK tablet Take 6 tabs by mouth daily  for 2 days, then 5 tabs for 2 days, then 4 tabs for 2 days, then 3 tabs for 2 days, 2 tabs for 2 days, then 1 tab by mouth daily for 2 days 42 tablet 0   No current facility-administered medications for this visit.    Allergies as of 05/07/2020  . (No Known Allergies)    Order: [id number]  Status: Final result Visible to patient: No (not released) Next appt: 05/13/2019 at 10:00 AM in Gastroenterology Neil Crouch, PA-C)    Ref Range & Units 16mo ago   Alcohol, Ethyl (B) <10 mg/dL <10   Comment: (NOTE)  Lowest detectable limit for serum alcohol is 10 mg/dL.  For medical purposes only.  Performed at [facility], [address] [place], [place] [address]    Resulting Agency  [facility]     Specimen Collected: 01/30/19 16:42 Last Resulted: 01/30/19 17:33  Lab Flowsheet  Order Details  View Encounter  Lab and Collection Details  Routing  Result History     Other Results from 01/30/2019 CBC with Differential Order: [id number]   Status: Final result Visible to patient: No (not released) Next appt: 05/13/2019 at 10:00 AM in Gastroenterology Neil Crouch, PA-C)    Ref Range & Units 47mo ago  45mo ago   WBC 4.0 - 10.5 K/uL 7.0  3.3  RBC 4.22 - 5.81 MIL/uL 4.08 3.50  Hemoglobin 13.0 - 17.0 g/dL 13.5  11.4  HCT 39.0 - 52.0 % 41.4  33.6  MCV 80.0 - 100.0 fL 101.5 96.0   MCH 26.0 - 34.0 pg 33.1  32.6   MCHC 30.0 - 36.0 g/dL 32.6  33.9   RDW 11.5 - 15.5 % 16.4 17.5  Platelets 150 - 400 K/uL 220  170   nRBC 0.0 - 0.2 % 0.3 0.6  Neutrophils Relative % %  80  32   Neutro Abs 1.7 - 7.7 K/uL 5.5  1.1  Lymphocytes Relative % 13  56   Lymphs Abs 0.7 - 4.0 K/uL 0.9  1.9   Monocytes Relative % 7  12   Monocytes Absolute 0.1 - 1.0 K/uL 0.5  0.4   Eosinophils Relative % 0  0   Eosinophils Absolute 0.0 - 0.5 K/uL 0.0  0.0   Basophils Relative % 0  0   Basophils Absolute 0.0 - 0.1 K/uL 0.0  0.0     Immature Granulocytes % 0  0   Abs Immature Granulocytes 0.00 - 0.07 K/uL 0.03  0.00 CM   Comment: Performed at [facility], [address], [place], [place] [address]  Resulting Agency  [facility]     Specimen Collected: 01/30/19 16:42 Last Resulted: 01/30/19 17:28  Lab Flowsheet  Order Details  View Encounter  Lab and Collection Details  Routing  Result History   CM=Additional comments       Basic metabolic panel Order: [id number]   Status: Final result Visible to patient: No (not released) Next appt: 05/13/2019 at 10:00 AM in Gastroenterology Neil Crouch, PA-C)    Ref Range & Units 6mo ago  5mo ago   Sodium 135 - 145 mmol/L 131 141   Potassium 3.5 - 5.1 mmol/L 3.6  3.4  Chloride 98 - 111 mmol/L 90 99   CO2 22 - 32 mmol/L 19 22   Glucose, Bld 70 - 99 mg/dL 139 109  BUN 8 - 23 mg/dL 25 9   Creatinine, Ser 0.61 - 1.24 mg/dL 1.64 0.78   Calcium 8.9 - 10.3 mg/dL 9.3  9.8   GFR calc non Af Amer >60 mL/min 43 60 mL/min" class="[id number]">60   GFR calc Af Amer >60 mL/min 49 60 mL/min" class="[id number]">60   Anion gap 5 - 15 >20 20CM   Comment: Performed at [facility], [address], [place], [place] [address]  Resulting Agency  [facility]     Specimen Collected: 01/30/19 16:42 Last Resulted: 01/30/19 17:53  Lab Flowsheet  Order Details  View Encounter  Lab and Collection Details  Routing  Result History   CM=Additional comments       Urine rapid drug screen (hosp performed) Order: [id number]   Status: Final result Visible to patient: No (not released) Next appt: 05/13/2019 at 10:00 AM in Gastroenterology Neil Crouch, PA-C)    Ref Range & Units 42mo ago  57yr ago   Opiates NONE DETECTED NONE DETECTED  NEGATIVE R   Cocaine NONE DETECTED NONE DETECTED    Benzodiazepines NONE DETECTED NONE DETECTED  NEGATIVE R   Amphetamines NONE DETECTED NONE DETECTED    Tetrahydrocannabinol NONE DETECTED NONE DETECTED    Barbiturates NONE  DETECTED NONE DETECTED  NEGATIVE R   Comment: (NOTE)  DRUG SCREEN FOR MEDICAL PURPOSES  ONLY. IF CONFIRMATION IS NEEDED  FOR ANY PURPOSE, NOTIFY LAB  WITHIN 5 DAYS.  LOWEST DETECTABLE LIMITS  FOR URINE DRUG SCREEN  Drug Class Cutoff (ng/mL)  Amphetamine and metabolites 1000  Barbiturate and metabolites 200  Benzodiazepine 248  Tricyclics and metabolites 300  Opiates and metabolites 300  Cocaine and metabolites 300  THC 50  Performed at [place] [place],  [address]    Resulting Agency  [facility]     Specimen Collected: 01/30/19 16:28 Last Resulted: 01/30/19 19:00  Lab Flowsheet  Order Details  Economist  Result History   R=Reference range differs from displayed range       Vitals: BP 106/80   Pulse (!) 101   Ht 6\' 1"  (1.854 m)   Wt 125 lb (56.7 kg)   BMI 16.49 kg/m  Last Weight:  Wt Readings from Last 1 Encounters:  05/07/20 125 lb (56.7 kg)   Last Height:   Ht Readings from Last 1 Encounters:  05/07/20 6\' 1"  (1.854 m)    Physical exam:  General: The patient is awake, alert and appears not in acute distress. The patient is groomed but malnourished. . Head: Normocephalic, atraumatic.  Neck is supple. Mallampati3, neck circumference:14.5 " Cardiovascular:  Regular rate and rhythm, without  murmurs or carotid bruit, and without distended neck veins. Respiratory: Lungs are clear to auscultation. Skin:  Without evidence of edema, or rash Trunk: BMI is too low- under weight.    Neurologic exam : The patient is awake and alert, oriented to place and time.  Memory subjective described as "Ok". There is no normal attention span & concentration ability. Speech is non- fluent with dysarthria, dysphonia and repetitive , aphasia. He is very quiet. He speaks "jittery".  Mood and affect are calm.   Cranial nerves: Pupils are equal and briskly reactive to light. Funduscopic exam deferred.   Extraocular movements  in vertical and horizontal planes intact and without nystagmus.  Visual fields by finger perimetry are intact. Hearing to finger rub intact.  Facial sensation intact to fine touch. Facial motor strength is symmetric and tongue and uvula move midline. Tongue protrusion into either cheek is normal.  Shoulder shrug is normal.   Motor exam:   Normal tone ,but atrophied muscle bulk and symmetric strength in upper extremities. He has a droopy shoulder and he limps with the right leg.  Sensory:  Fine touch, pinprick and vibration were tested in all extremities- he was cooperative but he reports no numbness.  Proprioception was normal.  Coordination:  He was well able to draw a archimedes spiral, but he can't get a shirt buttoned, or a coin out of a pocket.  Rapid alternating movements in the fingers/hands were normal. Finger-to-nose maneuver  normal without evidence of ataxia, dysmetria or tremor.  Gait and station: Patient walks without assistive device - Strength within normal limits. Stance is unstable and he limps on the right, turned with 6 steps. Wide based. Ataxia.    Tandem gait is severely fragmented. Romberg testing is positive  Deep tendon reflexes: in the  upper and lower extremities are symmetric and intact.  Babinski maneuver response is downgoing.   Assessment:  35 minutes -After physical and neurologic examination, review of laboratory studies, imaging, neurophysiology testing and pre-existing records, assessment is that of :   5 seizures now in total, series of TC events in September- his family has felt he wasn't drinking on that day   Plan:  Treatment plan and additional workup :  Keppra will be taken at 500 mg bid, consider the low BMI. He was given a prescription for 500 mg daily form the ED.  He should not take tylenol with his liver damage, but take lactulose to eliminate Ammonia.  Gabapentin at night may help reduce alcohol cravings. 100mg  in AM and  200 mg at bedtime.  Encourage alcohol treatment , AA- consider  [facility].  HYDRATION with water, Gatorade, and increased protein intake.    Asencion Partridge Bradee Common MD 05/07/2020

## 2020-05-07 NOTE — Patient Instructions (Signed)
Assessment:  After physical and neurologic examination, review of laboratory studies, imaging, neurophysiology testing and pre-existing records, assessment is that of :   5 seizures now in total, series of TC events in September- his family has felt he wasn't drinking on that day. I am awaiting the ED discharge report from [facility] [address] [facility]. [employer] [facility]    Plan:  Treatment plan and additional workup :  Keppra will be taken at 500 mg bid, consider the low BMI. He was given a prescription for 500 mg daily form the ED.  He should not take tylenol with his liver damage, but take lactulose to eliminate Ammonia.  Gabapentin at night may help reduce alcohol cravings. 100mg  in AM and 200 mg at bedtime.  Encourage alcohol treatment , AA- consider  [facility].  HYDRATION with water, Gatorade, and increased protein intake.

## 2020-05-11 ENCOUNTER — Telehealth: Payer: Self-pay | Admitting: *Deleted

## 2020-05-11 NOTE — Telephone Encounter (Signed)
Request faxed to [facility] faxed # [phone]

## 2020-05-20 DIAGNOSIS — E785 Hyperlipidemia, unspecified: Secondary | ICD-10-CM | POA: Diagnosis not present

## 2020-05-20 DIAGNOSIS — J439 Emphysema, unspecified: Secondary | ICD-10-CM | POA: Diagnosis not present

## 2020-05-20 DIAGNOSIS — I10 Essential (primary) hypertension: Secondary | ICD-10-CM | POA: Diagnosis not present

## 2020-05-20 DIAGNOSIS — R69 Illness, unspecified: Secondary | ICD-10-CM | POA: Diagnosis not present

## 2020-07-23 DIAGNOSIS — R69 Illness, unspecified: Secondary | ICD-10-CM | POA: Diagnosis not present

## 2020-07-23 DIAGNOSIS — J439 Emphysema, unspecified: Secondary | ICD-10-CM | POA: Diagnosis not present

## 2020-07-23 DIAGNOSIS — G8929 Other chronic pain: Secondary | ICD-10-CM | POA: Diagnosis not present

## 2020-07-23 DIAGNOSIS — I10 Essential (primary) hypertension: Secondary | ICD-10-CM | POA: Diagnosis not present

## 2020-07-23 DIAGNOSIS — E785 Hyperlipidemia, unspecified: Secondary | ICD-10-CM | POA: Diagnosis not present

## 2020-07-29 DIAGNOSIS — G8929 Other chronic pain: Secondary | ICD-10-CM | POA: Diagnosis not present

## 2020-07-29 DIAGNOSIS — E785 Hyperlipidemia, unspecified: Secondary | ICD-10-CM | POA: Diagnosis not present

## 2020-07-29 DIAGNOSIS — I10 Essential (primary) hypertension: Secondary | ICD-10-CM | POA: Diagnosis not present

## 2020-07-29 DIAGNOSIS — J439 Emphysema, unspecified: Secondary | ICD-10-CM | POA: Diagnosis not present

## 2020-07-29 DIAGNOSIS — R69 Illness, unspecified: Secondary | ICD-10-CM | POA: Diagnosis not present

## 2020-08-26 ENCOUNTER — Encounter: Payer: Self-pay | Admitting: Acute Care

## 2020-08-28 DIAGNOSIS — E785 Hyperlipidemia, unspecified: Secondary | ICD-10-CM | POA: Diagnosis not present

## 2020-08-28 DIAGNOSIS — J439 Emphysema, unspecified: Secondary | ICD-10-CM | POA: Diagnosis not present

## 2020-08-28 DIAGNOSIS — R69 Illness, unspecified: Secondary | ICD-10-CM | POA: Diagnosis not present

## 2020-08-28 DIAGNOSIS — I10 Essential (primary) hypertension: Secondary | ICD-10-CM | POA: Diagnosis not present

## 2020-08-28 DIAGNOSIS — G8929 Other chronic pain: Secondary | ICD-10-CM | POA: Diagnosis not present

## 2020-09-11 ENCOUNTER — Encounter (HOSPITAL_COMMUNITY): Payer: Self-pay

## 2020-09-11 ENCOUNTER — Emergency Department (HOSPITAL_COMMUNITY): Payer: Medicare HMO

## 2020-09-11 ENCOUNTER — Other Ambulatory Visit: Payer: Self-pay

## 2020-09-11 ENCOUNTER — Emergency Department (HOSPITAL_COMMUNITY)
Admission: EM | Admit: 2020-09-11 | Discharge: 2020-09-11 | Disposition: A | Discharge status: Home / Self Care | Payer: Medicare HMO | Attending: Emergency Medicine | Admitting: Emergency Medicine

## 2020-09-11 DIAGNOSIS — F17210 Nicotine dependence, cigarettes, uncomplicated: Secondary | ICD-10-CM | POA: Insufficient documentation

## 2020-09-11 DIAGNOSIS — R531 Weakness: Secondary | ICD-10-CM | POA: Diagnosis not present

## 2020-09-11 DIAGNOSIS — E860 Dehydration: Secondary | ICD-10-CM | POA: Diagnosis not present

## 2020-09-11 DIAGNOSIS — R69 Illness, unspecified: Secondary | ICD-10-CM | POA: Diagnosis not present

## 2020-09-11 DIAGNOSIS — Z79899 Other long term (current) drug therapy: Secondary | ICD-10-CM | POA: Diagnosis not present

## 2020-09-11 DIAGNOSIS — Z743 Need for continuous supervision: Secondary | ICD-10-CM | POA: Diagnosis not present

## 2020-09-11 DIAGNOSIS — W19XXXA Unspecified fall, initial encounter: Secondary | ICD-10-CM | POA: Diagnosis not present

## 2020-09-11 DIAGNOSIS — H538 Other visual disturbances: Secondary | ICD-10-CM | POA: Diagnosis not present

## 2020-09-11 DIAGNOSIS — Z20822 Contact with and (suspected) exposure to covid-19: Secondary | ICD-10-CM | POA: Insufficient documentation

## 2020-09-11 DIAGNOSIS — I10 Essential (primary) hypertension: Secondary | ICD-10-CM | POA: Insufficient documentation

## 2020-09-11 DIAGNOSIS — R000 Tachycardia, unspecified: Secondary | ICD-10-CM | POA: Diagnosis not present

## 2020-09-11 DIAGNOSIS — R188 Other ascites: Secondary | ICD-10-CM | POA: Diagnosis not present

## 2020-09-11 DIAGNOSIS — G252 Other specified forms of tremor: Secondary | ICD-10-CM | POA: Insufficient documentation

## 2020-09-11 DIAGNOSIS — R7989 Other specified abnormal findings of blood chemistry: Secondary | ICD-10-CM | POA: Diagnosis not present

## 2020-09-11 DIAGNOSIS — R55 Syncope and collapse: Secondary | ICD-10-CM

## 2020-09-11 LAB — CBC WITH DIFFERENTIAL/PLATELET
Abs Immature Granulocytes: 0.02 10*3/uL (ref 0.00–0.07)
Basophils Absolute: 0 10*3/uL (ref 0.0–0.1)
Basophils Relative: 0 %
Eosinophils Absolute: 0 10*3/uL (ref 0.0–0.5)
Eosinophils Relative: 0 %
HCT: 47.6 % (ref 39.0–52.0)
Hemoglobin: 15.4 g/dL (ref 13.0–17.0)
Immature Granulocytes: 0 %
Lymphocytes Relative: 19 %
Lymphs Abs: 1.5 10*3/uL (ref 0.7–4.0)
MCH: 30.5 pg (ref 26.0–34.0)
MCHC: 32.4 g/dL (ref 30.0–36.0)
MCV: 94.3 fL (ref 80.0–100.0)
Monocytes Absolute: 0.6 10*3/uL (ref 0.1–1.0)
Monocytes Relative: 8 %
Neutro Abs: 5.4 10*3/uL (ref 1.7–7.7)
Neutrophils Relative %: 73 %
Platelets: 393 10*3/uL (ref 150–400)
RBC: 5.05 MIL/uL (ref 4.22–5.81)
RDW: 16.1 % — ABNORMAL HIGH (ref 11.5–15.5)
WBC: 7.5 10*3/uL (ref 4.0–10.5)
nRBC: 0.3 % — ABNORMAL HIGH (ref 0.0–0.2)

## 2020-09-11 LAB — COMPREHENSIVE METABOLIC PANEL
ALT: 9 U/L (ref 0–44)
AST: 20 U/L (ref 15–41)
Albumin: 4 g/dL (ref 3.5–5.0)
Alkaline Phosphatase: 36 U/L — ABNORMAL LOW (ref 38–126)
Anion gap: 11 (ref 5–15)
BUN: 22 mg/dL (ref 8–23)
CO2: 31 mmol/L (ref 22–32)
Calcium: 9.8 mg/dL (ref 8.9–10.3)
Chloride: 95 mmol/L — ABNORMAL LOW (ref 98–111)
Creatinine, Ser: 1.26 mg/dL — ABNORMAL HIGH (ref 0.61–1.24)
GFR, Estimated: >60 mL/min (ref >60)
Glucose, Bld: 169 mg/dL — ABNORMAL HIGH (ref 70–99)
Potassium: 4 mmol/L (ref 3.5–5.1)
Sodium: 137 mmol/L (ref 135–145)
Total Bilirubin: 0.8 mg/dL (ref 0.3–1.2)
Total Protein: 7.1 g/dL (ref 6.5–8.1)

## 2020-09-11 LAB — URINALYSIS, ROUTINE W REFLEX MICROSCOPIC
Bilirubin Urine: NEGATIVE
Glucose, UA: NEGATIVE mg/dL
Hgb urine dipstick: NEGATIVE
Ketones, ur: 5 mg/dL — AB
Leukocytes,Ua: NEGATIVE
Nitrite: NEGATIVE
Protein, ur: 100 mg/dL — AB
Specific Gravity, Urine: 1.023 (ref 1.005–1.030)
pH: 5 (ref 5.0–8.0)

## 2020-09-11 LAB — RESP PANEL BY RT-PCR (FLU A&B, COVID) ARPGX2
Influenza A by PCR: NEGATIVE
Influenza B by PCR: NEGATIVE
SARS Coronavirus 2 by RT PCR: NEGATIVE

## 2020-09-11 LAB — CBG MONITORING, ED: Glucose-Capillary: 157 mg/dL — ABNORMAL HIGH (ref 70–99)

## 2020-09-11 LAB — TROPONIN I (HIGH SENSITIVITY)
Troponin I (High Sensitivity): 8 ng/L (ref <18)
Troponin I (High Sensitivity): 8 ng/L (ref <18)

## 2020-09-11 LAB — AMMONIA: Ammonia: 24 umol/L (ref 9–35)

## 2020-09-11 LAB — D-DIMER, QUANTITATIVE: D-Dimer, Quant: >20 ug/mL-FEU — ABNORMAL HIGH (ref 0.00–0.50)

## 2020-09-11 LAB — ETHANOL: Alcohol, Ethyl (B): <10 mg/dL (ref <10)

## 2020-09-11 MED ORDER — SODIUM CHLORIDE 0.9 % IV BOLUS
1000.0000 mL | Freq: Once | INTRAVENOUS | Status: AC
  Administered 2020-09-11: 1000 mL via INTRAVENOUS

## 2020-09-11 MED ORDER — IOHEXOL 350 MG/ML SOLN
100.0000 mL | Freq: Once | INTRAVENOUS | Status: AC | PRN
  Administered 2020-09-11: 100 mL via INTRAVENOUS

## 2020-09-11 MED ORDER — SODIUM CHLORIDE 0.9 % IV BOLUS
500.0000 mL | Freq: Once | INTRAVENOUS | Status: AC
  Administered 2020-09-11: 500 mL via INTRAVENOUS

## 2020-09-11 NOTE — ED Provider Notes (Signed)
[facility] EMERGENCY DEPARTMENT Provider Note   CSN: [id number] Arrival date & time: 09/11/20  1208     History Chief Complaint  Patient presents with   Scott Jackson is a 70 y.o. male with a history of HTN, etoh abuse (reports no etoh consumption in the past week), h/o seizure disorder, presenting for evaluation of syncope.  He reports lying in bed this am around 10 am when he got up and walked to the bathroom.  He reports feeling light headed and by the time he sat down on the toilet he describes blurred vision, worsened dizziness, then passed out.  He was found by his sister who heard him fall off the commode.  He denies any injury including no head injury or headache currently.  He reports feeling his heart racing, denies sob or chest pain.  He denies fevers or chills, cough, no n/v, abdominal pain but states he ate a [place] sandwich for supper last night that tasted "off" and had abdominal aching last night, resolved today. He reports having prior episodes of similar sx, stating he passed out last week and again last month.  He reports fair appetite, reports hydrating well.  The history is provided by the patient.       Past Medical History:  Diagnosis Date   ETOH abuse    Hypertension    Pulmonary nodule    Tobacco abuse     Patient Active Problem List   Diagnosis Date Noted   Alcohol dependence with other alcohol-induced disorder ([place]) 05/07/2020   Seizure disorder ([place]) 05/07/2020   Anorexia nervosa 10/16/2019   Loss of weight 05/13/2019   Abnormal CT scan, colon 05/13/2019   Single seizure ([place]) 04/09/2019   Adult failure to thrive 04/09/2019   Alcoholism with alcohol dependence ([place]) 03/19/2019   Alcoholism associated with dementia ([place]) 03/19/2019   Emphysema of lung ([place]) 03/19/2019   Abdominal pain, epigastric    Hemorrhoid 09/28/2018   Rectal bleeding 09/28/2018   Constipation 09/28/2018   Screen for sexually transmitted diseases  11/24/2017   Hyperlipidemia 11/24/2017   HTN, goal below 140/90 11/24/2017   Screening for viral disease 11/24/2017   Alcohol abuse 11/24/2017   Alcohol abuse counseling and surveillance of alcoholic 95/03/3266    Past Surgical History:  Procedure Laterality Date   ANKLE FRACTURE SURGERY     APPENDECTOMY     BIOPSY  01/08/2019   Procedure: BIOPSY;  Surgeon: Danie Binder, MD;  Location: [facility];  Service: Endoscopy;;  gastric   COLONOSCOPY  01/2015   Dr. Posey Pronto: two 2-87mm rectal polyps removed, hyperplastic   COLONOSCOPY  2007   Dr. Posey Pronto: three adenomas removed measuring 4, 10, 50mm. diverticulosis   COLONOSCOPY WITH PROPOFOL N/A 01/08/2019   Dr. Oneida Alar: Severe diverticulosis with fixed rectosigmoid region, rectal bleeding likely due to internal hemorrhoids.  Recommended repeat colonoscopy at [facility] because examination was incomplete due to fixed rectosigmoid colon (patient never followed through).   ESOPHAGOGASTRODUODENOSCOPY (EGD) WITH PROPOFOL N/A 01/08/2019   Dr. Oneida Alar: Low-grade narrowing Schatzki ring at the GE junction, medium sized hiatal hernia, erosive gastritis/duodenitis in the setting of NSAID and alcohol use.  Gastric biopsy showed gastropathy but no H. pylori   intestininal blockage  1970       Family History  Problem Relation Age of Onset   Hypertension Mother    Stroke Mother    Fibromyalgia Daughter    Lupus Daughter    Anxiety disorder Daughter  Post-traumatic stress disorder Daughter    Asthma Daughter    Breast cancer Sister    Breast cancer Sister    Colon cancer Neg Hx     Social History   Tobacco Use   Smoking status: Current Every Day Smoker    Packs/day: 0.25    Years: 50.00    Pack years: 12.50    Types: Cigarettes   Smokeless tobacco: Never Used  Vaping Use   Vaping Use: Never used  Substance Use Topics   Alcohol use: Yes    Comment: drank drink one week ago   Drug use: No    Home  Medications Prior to Admission medications   Medication Sig Start Date End Date Taking? Authorizing Provider  amLODipine (NORVASC) 10 MG tablet Take 10 mg by mouth daily. 11/20/18  Yes [provider]  Cyanocobalamin (B-12) 500 MCG SUBL DISSOLVE UNDER TONGUE ONCE DAILY AS DIRECTED 08/30/19  Yes [provider]  gabapentin (NEURONTIN) 100 MG capsule One in AM and 2 at bedtime/ 05/07/20  Yes Dohmeier, Asencion Partridge, MD  lisinopril-hydrochlorothiazide (ZESTORETIC) 20-12.5 MG tablet Take 2 tablets by mouth every morning. 07/28/20  Yes [provider]  Multiple Vitamin (MULTIVITAMIN WITH MINERALS) TABS tablet Take 1 tablet by mouth daily.   Yes [provider]  PARoxetine (PAXIL) 40 MG tablet Take 40 mg by mouth every morning.   Yes [provider]  lactulose (CHRONULAC) 10 GM/15ML solution Take 30 mLs (20 g total) by mouth 2 (two) times daily. To have 2-3 soft stools daily to keep ammonia down and stay mentally clear. Patient not taking: Reported on 09/11/2020 05/07/20   Dohmeier, Asencion Partridge, MD  levETIRAcetam (KEPPRA) 500 MG tablet Take 1 tablet (500 mg total) by mouth 2 (two) times daily. Patient not taking: Reported on 09/11/2020 05/07/20   Dohmeier, Asencion Partridge, MD  predniSONE (STERAPRED UNI-PAK 21 TAB) 10 MG (21) TBPK tablet Take 6 tabs by mouth daily  for 2 days, then 5 tabs for 2 days, then 4 tabs for 2 days, then 3 tabs for 2 days, 2 tabs for 2 days, then 1 tab by mouth daily for 2 days Patient not taking: Reported on 09/11/2020 10/28/19   Emerson Monte, FNP    Allergies    Patient has no known allergies.  Review of Systems   Review of Systems  Constitutional: Negative for chills and fever.  HENT: Negative for congestion and sore throat.   Eyes: Negative.   Respiratory: Negative for chest tightness and shortness of breath.   Cardiovascular: Positive for palpitations. Negative for chest pain.  Gastrointestinal: Negative for abdominal pain, nausea and vomiting.   Genitourinary: Negative.   Musculoskeletal: Negative for arthralgias, joint swelling and neck pain.  Skin: Negative.  Negative for rash and wound.  Neurological: Positive for syncope and light-headedness. Negative for dizziness, weakness, numbness and headaches.  Psychiatric/Behavioral: Negative.     Physical Exam Updated Vital Signs BP (!) 147/106    Pulse 83    Temp 98.4 F (36.9 C) (Rectal)    Resp (!) 26    Wt 58.8 kg    SpO2 99%    BMI 17.10 kg/m   Physical Exam Vitals and nursing note reviewed.  Constitutional:      Appearance: He is well-developed and well-nourished.  HENT:     Head: Normocephalic and atraumatic.     Mouth/Throat:     Mouth: Mucous membranes are moist.  Eyes:     Conjunctiva/sclera: Conjunctivae normal.  Cardiovascular:  Rate and Rhythm: Regular rhythm. Tachycardia present.     Pulses: Intact distal pulses.     Heart sounds: Normal heart sounds.  Pulmonary:     Effort: Pulmonary effort is normal.     Breath sounds: Normal breath sounds. No wheezing.  Abdominal:     General: Bowel sounds are normal.     Palpations: Abdomen is soft.     Tenderness: There is no abdominal tenderness. There is no guarding.  Musculoskeletal:        General: No swelling or tenderness. Normal range of motion.     Cervical back: Normal range of motion.     Right lower leg: No edema.     Left lower leg: No edema.  Skin:    General: Skin is warm and dry.  Neurological:     General: No focal deficit present.     Mental Status: He is alert.     Cranial Nerves: Cranial nerves are intact. No cranial nerve deficit.     Sensory: Sensation is intact.     Motor: Tremor present.     Gait: Gait normal.     Comments: Intentional tremor right upper extremity.  Psychiatric:        Mood and Affect: Mood and affect normal.     ED Results / Procedures / Treatments   Labs (all labs ordered are listed, but only abnormal results are displayed) Labs Reviewed  URINALYSIS,  ROUTINE W REFLEX MICROSCOPIC - Abnormal; Notable for the following components:      Result Value   APPearance HAZY (*)    Ketones, ur 5 (*)    Protein, ur 100 (*)    Bacteria, UA RARE (*)    All other components within normal limits  CBC WITH DIFFERENTIAL/PLATELET - Abnormal; Notable for the following components:   RDW 16.1 (*)    nRBC 0.3 (*)    All other components within normal limits  COMPREHENSIVE METABOLIC PANEL - Abnormal; Notable for the following components:   Chloride 95 (*)    Glucose, Bld 169 (*)    Creatinine, Ser 1.26 (*)    Alkaline Phosphatase 36 (*)    All other components within normal limits  D-DIMER, QUANTITATIVE - Abnormal; Notable for the following components:   D-Dimer, Quant >20.00 (*)    All other components within normal limits  CBG MONITORING, ED - Abnormal; Notable for the following components:   Glucose-Capillary 157 (*)    All other components within normal limits  RESP PANEL BY RT-PCR (FLU A&B, COVID) ARPGX2  ETHANOL  AMMONIA  TROPONIN I (HIGH SENSITIVITY)  TROPONIN I (HIGH SENSITIVITY)    EKG ED ECG REPORT   Date: 09/12/2020  Rate: 97  Rhythm: normal sinus rhythm  QRS Axis: normal  Intervals: normal  ST/T Wave abnormalities: normal  Conduction Disutrbances:none  Narrative Interpretation:   Old EKG Reviewed: unchanged  I have personally reviewed the EKG tracing and agree with the computerized printout as noted.   Radiology CT Angio Chest PE W and/or Wo Contrast  Result Date: 09/11/2020 CLINICAL DATA:  Loss of consciousness.  Positive D-dimer. EXAM: CT ANGIOGRAPHY CHEST WITH CONTRAST TECHNIQUE: Multidetector CT imaging of the chest was performed using the standard protocol during bolus administration of intravenous contrast. Multiplanar CT image reconstructions and MIPs were obtained to evaluate the vascular anatomy. CONTRAST:  182mL OMNIPAQUE IOHEXOL 350 MG/ML SOLN COMPARISON:  None. FINDINGS: Cardiovascular: Contrast injection is  sufficient to demonstrate satisfactory opacification of the pulmonary arteries to the segmental  level. There is no pulmonary embolus or evidence of right heart strain. The size of the main pulmonary artery is normal. Heart size is normal, with no pericardial effusion. The course and caliber of the aorta are normal. There is no atherosclerotic calcification. Opacification decreased due to pulmonary arterial phase contrast bolus timing. Mediastinum/Nodes: No mediastinal, hilar or axillary lymphadenopathy. Normal visualized thyroid. Thoracic esophageal course is normal. Lungs/Pleura: Airways are patent. No pleural effusion, lobar consolidation, pneumothorax or pulmonary infarction. Upper Abdomen: Contrast bolus timing is not optimized for evaluation of the abdominal organs. Small amount of upper abdominal ascites. Musculoskeletal: No chest wall abnormality. No bony spinal canal stenosis. Review of the MIP images confirms the above findings. IMPRESSION: 1. No pulmonary embolus or acute aortic syndrome. 2. Small amount of upper abdominal ascites. Electronically Signed   By: Ulyses Jarred M.D.   On: 09/11/2020 20:16   DG Chest Portable 1 View  Result Date: 09/11/2020 CLINICAL DATA:  Syncope. EXAM: PORTABLE CHEST 1 VIEW COMPARISON:  August 18, 2017. FINDINGS: The heart size and mediastinal contours are within normal limits. Both lungs are clear. The visualized skeletal structures are unremarkable. IMPRESSION: No active disease. Electronically Signed   By: Marijo Conception M.D.   On: 09/11/2020 14:22    Procedures Procedures   Medications Ordered in ED Medications  sodium chloride 0.9 % bolus 1,000 mL (0 mLs Intravenous Stopped 09/11/20 1602)  sodium chloride 0.9 % bolus 500 mL (0 mLs Intravenous Stopped 09/11/20 1805)  iohexol (OMNIPAQUE) 350 MG/ML injection 100 mL (100 mLs Intravenous Contrast Given 09/11/20 1948)    ED Course  I have reviewed the triage vital signs and the nursing notes.  Pertinent labs  & imaging results that were available during my care of the patient were reviewed by me and considered in my medical decision making (see chart for details).    MDM Rules/Calculators/A&P                          Pt with syncope with prodromal sx suggesting orthostatic source. At baseline here, he is borderline tachycardic at rest, becomes significantly tachycardic with  positional changes but denies dizziness or lightheadedness with these changes. No chest pain or sob, but with significantly elevated d dimer, CTA imaging completed, no PE.    He was given IV fluids after which his pulse returned to normal range and he felt improved, sx free.  No arhythmia while monitored here.  Delta trop's negative.  Syncope possibly orthostatic related/dehydration, creatinine is bumped at 1.26 although BUN normal range.  He will benefit from cardiology outpatient f/u.  Review of chart indicates syncopal episodes last year, referral to cardiology but pt states he never went.  Discussed the importance of making sure these syncopal events are not from an arrhythmia or other cardiac source.  Pt understands and will arrange outpt f/u.  The patient appears reasonably screened and/or stabilized for discharge and I doubt any other medical condition or other [facility] requiring further screening, evaluation, or treatment in the ED at this time prior to discharge.  He was advised to increase healthy fluid intake.    Final Clinical Impression(s) / ED Diagnoses Final diagnoses:  Syncope and collapse    Rx / DC Orders ED Discharge Orders    None       Landis Martins 09/12/20 [place], Alvin Critchley, DO 09/12/20 1602

## 2020-09-11 NOTE — ED Notes (Signed)
Advised patient we still needed urine specimen.  Patient stated he would ring call bell when he could provide.

## 2020-09-11 NOTE — ED Triage Notes (Addendum)
Pt brought in by [facility] due to fall . Pt did not lose consciousness and is not on blood thinners. EMS report that systolic BP  was in the 94'B and his skin was cool to touch. Pt reports he got dizzy and fell while trying to walk to BR. Reported no alcohol since last Thursday .

## 2020-09-11 NOTE — ED Notes (Signed)
Socks placed on patient 

## 2020-09-11 NOTE — Discharge Instructions (Addendum)
Your work-up here tonight including labs, x-rays, EKG and exam are reassuring.  Since you are feeling better after receiving IV fluids, I suspect your symptoms may be due to dehydration.  Encourage you to to drink plenty of fluids to help prevent this in the future.  You do need follow-up care with cardiology for further evaluation of these episodes of passing out, to make sure that this is not heart related.  Call the number listed above to get an appointment to see our local [place] cardiologist.  In the meantime return here if you develop any new or worsening symptoms.

## 2020-09-11 NOTE — ED Notes (Signed)
Orthostatic vital signs done.  Patient's o2 sat dropped from 98% laying to 80% when standing.

## 2020-09-30 DIAGNOSIS — J439 Emphysema, unspecified: Secondary | ICD-10-CM | POA: Diagnosis not present

## 2020-09-30 DIAGNOSIS — E785 Hyperlipidemia, unspecified: Secondary | ICD-10-CM | POA: Diagnosis not present

## 2020-09-30 DIAGNOSIS — R69 Illness, unspecified: Secondary | ICD-10-CM | POA: Diagnosis not present

## 2020-09-30 DIAGNOSIS — G8929 Other chronic pain: Secondary | ICD-10-CM | POA: Diagnosis not present

## 2020-09-30 DIAGNOSIS — I10 Essential (primary) hypertension: Secondary | ICD-10-CM | POA: Diagnosis not present

## 2020-10-09 DIAGNOSIS — N401 Enlarged prostate with lower urinary tract symptoms: Secondary | ICD-10-CM | POA: Diagnosis not present

## 2020-10-09 DIAGNOSIS — R3915 Urgency of urination: Secondary | ICD-10-CM | POA: Diagnosis not present

## 2020-11-02 ENCOUNTER — Telehealth: Payer: Self-pay

## 2020-11-02 NOTE — Telephone Encounter (Signed)
Patient's daughter [name], left a voicemail at 8:30 AM today asking for a call to reschedule patient's appointment this Thursday.  Please call her back at [phone].

## 2020-11-02 NOTE — Telephone Encounter (Signed)
I called the patient's daughter [name]) back and left voicemail asking for call back to reschedule the patient's appt. I advised that I will go ahead and cancel the appointment for this Thursday and that as of right now we have an appointment next week on the 20th with Dr. Brett Fairy. I left the office number in message.   When the daughter calls back, please reschedule the appointment with Dr Brett Fairy.

## 2020-11-05 ENCOUNTER — Ambulatory Visit: Payer: Medicare HMO | Admitting: Neurology

## 2020-11-20 DIAGNOSIS — R69 Illness, unspecified: Secondary | ICD-10-CM | POA: Diagnosis not present

## 2020-11-20 DIAGNOSIS — I10 Essential (primary) hypertension: Secondary | ICD-10-CM | POA: Diagnosis not present

## 2020-11-20 DIAGNOSIS — J439 Emphysema, unspecified: Secondary | ICD-10-CM | POA: Diagnosis not present

## 2020-11-20 DIAGNOSIS — G8929 Other chronic pain: Secondary | ICD-10-CM | POA: Diagnosis not present

## 2020-11-20 DIAGNOSIS — E785 Hyperlipidemia, unspecified: Secondary | ICD-10-CM | POA: Diagnosis not present

## 2020-12-01 DIAGNOSIS — G8929 Other chronic pain: Secondary | ICD-10-CM | POA: Diagnosis not present

## 2020-12-01 DIAGNOSIS — E785 Hyperlipidemia, unspecified: Secondary | ICD-10-CM | POA: Diagnosis not present

## 2020-12-01 DIAGNOSIS — R69 Illness, unspecified: Secondary | ICD-10-CM | POA: Diagnosis not present

## 2020-12-01 DIAGNOSIS — I10 Essential (primary) hypertension: Secondary | ICD-10-CM | POA: Diagnosis not present

## 2020-12-01 DIAGNOSIS — J439 Emphysema, unspecified: Secondary | ICD-10-CM | POA: Diagnosis not present

## 2020-12-13 ENCOUNTER — Other Ambulatory Visit: Payer: Self-pay | Admitting: Neurology

## 2020-12-15 DIAGNOSIS — N401 Enlarged prostate with lower urinary tract symptoms: Secondary | ICD-10-CM | POA: Diagnosis not present

## 2020-12-15 DIAGNOSIS — R3915 Urgency of urination: Secondary | ICD-10-CM | POA: Diagnosis not present

## 2020-12-24 ENCOUNTER — Ambulatory Visit: Payer: Medicare HMO | Admitting: Neurology

## 2021-01-07 ENCOUNTER — Encounter: Payer: Self-pay | Admitting: Gastroenterology

## 2021-01-09 ENCOUNTER — Observation Stay (HOSPITAL_COMMUNITY)
Admission: EM | Admit: 2021-01-09 | Discharge: 2021-01-10 | Disposition: A | Discharge status: Home / Self Care | Payer: Medicare HMO | Attending: Family Medicine | Admitting: Family Medicine

## 2021-01-09 ENCOUNTER — Encounter (HOSPITAL_COMMUNITY): Payer: Self-pay | Admitting: Emergency Medicine

## 2021-01-09 ENCOUNTER — Other Ambulatory Visit: Payer: Self-pay

## 2021-01-09 ENCOUNTER — Emergency Department (HOSPITAL_COMMUNITY): Payer: Medicare HMO

## 2021-01-09 DIAGNOSIS — F10288 Alcohol dependence with other alcohol-induced disorder: Secondary | ICD-10-CM

## 2021-01-09 DIAGNOSIS — R627 Adult failure to thrive: Secondary | ICD-10-CM

## 2021-01-09 DIAGNOSIS — Z79899 Other long term (current) drug therapy: Secondary | ICD-10-CM | POA: Diagnosis not present

## 2021-01-09 DIAGNOSIS — J449 Chronic obstructive pulmonary disease, unspecified: Secondary | ICD-10-CM | POA: Diagnosis present

## 2021-01-09 DIAGNOSIS — R69 Illness, unspecified: Secondary | ICD-10-CM | POA: Diagnosis not present

## 2021-01-09 DIAGNOSIS — Z20822 Contact with and (suspected) exposure to covid-19: Secondary | ICD-10-CM | POA: Insufficient documentation

## 2021-01-09 DIAGNOSIS — R42 Dizziness and giddiness: Secondary | ICD-10-CM | POA: Diagnosis not present

## 2021-01-09 DIAGNOSIS — Z9114 Patient's other noncompliance with medication regimen: Secondary | ICD-10-CM

## 2021-01-09 DIAGNOSIS — Y900 Blood alcohol level of less than 20 mg/100 ml: Secondary | ICD-10-CM | POA: Insufficient documentation

## 2021-01-09 DIAGNOSIS — I10 Essential (primary) hypertension: Secondary | ICD-10-CM | POA: Diagnosis not present

## 2021-01-09 DIAGNOSIS — F17210 Nicotine dependence, cigarettes, uncomplicated: Secondary | ICD-10-CM | POA: Diagnosis not present

## 2021-01-09 DIAGNOSIS — I2510 Atherosclerotic heart disease of native coronary artery without angina pectoris: Secondary | ICD-10-CM | POA: Diagnosis not present

## 2021-01-09 DIAGNOSIS — R55 Syncope and collapse: Principal | ICD-10-CM

## 2021-01-09 DIAGNOSIS — Z720 Tobacco use: Secondary | ICD-10-CM | POA: Diagnosis not present

## 2021-01-09 DIAGNOSIS — J439 Emphysema, unspecified: Secondary | ICD-10-CM | POA: Diagnosis not present

## 2021-01-09 DIAGNOSIS — F1010 Alcohol abuse, uncomplicated: Secondary | ICD-10-CM

## 2021-01-09 DIAGNOSIS — R0600 Dyspnea, unspecified: Secondary | ICD-10-CM

## 2021-01-09 DIAGNOSIS — Z91148 Patient's other noncompliance with medication regimen for other reason: Secondary | ICD-10-CM

## 2021-01-09 LAB — COMPREHENSIVE METABOLIC PANEL
ALT: 12 U/L (ref 0–44)
AST: 22 U/L (ref 15–41)
Albumin: 4.3 g/dL (ref 3.5–5.0)
Alkaline Phosphatase: 37 U/L — ABNORMAL LOW (ref 38–126)
Anion gap: 17 — ABNORMAL HIGH (ref 5–15)
BUN: 18 mg/dL (ref 8–23)
CO2: 27 mmol/L (ref 22–32)
Calcium: 9.9 mg/dL (ref 8.9–10.3)
Chloride: 92 mmol/L — ABNORMAL LOW (ref 98–111)
Creatinine, Ser: 0.98 mg/dL (ref 0.61–1.24)
GFR, Estimated: >60 mL/min (ref >60)
Glucose, Bld: 120 mg/dL — ABNORMAL HIGH (ref 70–99)
Potassium: 3.8 mmol/L (ref 3.5–5.1)
Sodium: 136 mmol/L (ref 135–145)
Total Bilirubin: 0.8 mg/dL (ref 0.3–1.2)
Total Protein: 7.5 g/dL (ref 6.5–8.1)

## 2021-01-09 LAB — CBC WITH DIFFERENTIAL/PLATELET
Abs Immature Granulocytes: 0.02 10*3/uL (ref 0.00–0.07)
Basophils Absolute: 0 10*3/uL (ref 0.0–0.1)
Basophils Relative: 0 %
Eosinophils Absolute: 0 10*3/uL (ref 0.0–0.5)
Eosinophils Relative: 0 %
HCT: 38.5 % — ABNORMAL LOW (ref 39.0–52.0)
Hemoglobin: 12.6 g/dL — ABNORMAL LOW (ref 13.0–17.0)
Immature Granulocytes: 1 %
Lymphocytes Relative: 33 %
Lymphs Abs: 1.4 10*3/uL (ref 0.7–4.0)
MCH: 29.4 pg (ref 26.0–34.0)
MCHC: 32.7 g/dL (ref 30.0–36.0)
MCV: 89.7 fL (ref 80.0–100.0)
Monocytes Absolute: 0.6 10*3/uL (ref 0.1–1.0)
Monocytes Relative: 15 %
Neutro Abs: 2.2 10*3/uL (ref 1.7–7.7)
Neutrophils Relative %: 51 %
Platelets: 252 10*3/uL (ref 150–400)
RBC: 4.29 MIL/uL (ref 4.22–5.81)
RDW: 16.2 % — ABNORMAL HIGH (ref 11.5–15.5)
WBC: 4.2 10*3/uL (ref 4.0–10.5)
nRBC: 0 % (ref 0.0–0.2)

## 2021-01-09 LAB — RESP PANEL BY RT-PCR (FLU A&B, COVID) ARPGX2
Influenza A by PCR: NEGATIVE
Influenza B by PCR: NEGATIVE
SARS Coronavirus 2 by RT PCR: NEGATIVE

## 2021-01-09 LAB — MAGNESIUM: Magnesium: 1.6 mg/dL — ABNORMAL LOW (ref 1.7–2.4)

## 2021-01-09 LAB — AMMONIA: Ammonia: 15 umol/L (ref 9–35)

## 2021-01-09 LAB — LIPASE, BLOOD: Lipase: 20 U/L (ref 11–51)

## 2021-01-09 LAB — ETHANOL: Alcohol, Ethyl (B): <10 mg/dL (ref <10)

## 2021-01-09 LAB — VITAMIN B12: Vitamin B-12: 329 pg/mL (ref 180–914)

## 2021-01-09 MED ORDER — ONDANSETRON HCL 4 MG/2ML IJ SOLN
4.0000 mg | Freq: Once | INTRAMUSCULAR | Status: AC
  Administered 2021-01-09: 4 mg via INTRAVENOUS
  Filled 2021-01-09: qty 2

## 2021-01-09 MED ORDER — THIAMINE HCL 100 MG/ML IJ SOLN
100.0000 mg | Freq: Every day | INTRAMUSCULAR | Status: DC
  Filled 2021-01-09: qty 2

## 2021-01-09 MED ORDER — SODIUM CHLORIDE 0.9% FLUSH: 3.0000 mL | Freq: Two times a day (BID) | INTRAVENOUS | Status: DC

## 2021-01-09 MED ORDER — FOLIC ACID 1 MG PO TABS
1.0000 mg | ORAL_TABLET | Freq: Every day | ORAL | Status: DC
  Administered 2021-01-09 – 2021-01-10 (×2): 1 mg via ORAL
  Filled 2021-01-09 (×2): qty 1

## 2021-01-09 MED ORDER — ONDANSETRON HCL 4 MG/2ML IJ SOLN: 4.0000 mg | Freq: Four times a day (QID) | INTRAMUSCULAR | Status: DC | PRN

## 2021-01-09 MED ORDER — ONDANSETRON HCL 4 MG PO TABS: 4.0000 mg | ORAL_TABLET | Freq: Four times a day (QID) | ORAL | Status: DC | PRN

## 2021-01-09 MED ORDER — ADULT MULTIVITAMIN W/MINERALS CH: 1.0000 | ORAL_TABLET | Freq: Every day | ORAL | Status: DC

## 2021-01-09 MED ORDER — SODIUM CHLORIDE 0.9 % IV SOLN: INTRAVENOUS | Status: DC

## 2021-01-09 MED ORDER — HEPARIN SODIUM (PORCINE) 5000 UNIT/ML IJ SOLN
5000.0000 [IU] | Freq: Three times a day (TID) | INTRAMUSCULAR | Status: DC
  Administered 2021-01-09 – 2021-01-10 (×2): 5000 [IU] via SUBCUTANEOUS
  Filled 2021-01-09 (×2): qty 1

## 2021-01-09 MED ORDER — MAGNESIUM SULFATE 2 GM/50ML IV SOLN
2.0000 g | Freq: Once | INTRAVENOUS | Status: AC
  Administered 2021-01-09: 2 g via INTRAVENOUS
  Filled 2021-01-09: qty 50

## 2021-01-09 MED ORDER — POLYETHYLENE GLYCOL 3350 17 G PO PACK: 17.0000 g | PACK | Freq: Every day | ORAL | Status: DC | PRN

## 2021-01-09 MED ORDER — DIAZEPAM 2 MG PO TABS
2.0000 mg | ORAL_TABLET | Freq: Three times a day (TID) | ORAL | Status: DC
  Administered 2021-01-10: 2 mg via ORAL
  Filled 2021-01-09 (×2): qty 1

## 2021-01-09 MED ORDER — PANTOPRAZOLE SODIUM 40 MG IV SOLR
40.0000 mg | INTRAVENOUS | Status: AC
  Administered 2021-01-09: 40 mg via INTRAVENOUS
  Filled 2021-01-09: qty 40

## 2021-01-09 MED ORDER — TRAZODONE HCL 50 MG PO TABS
100.0000 mg | ORAL_TABLET | Freq: Every day | ORAL | Status: DC
  Administered 2021-01-09: 100 mg via ORAL
  Filled 2021-01-09: qty 2

## 2021-01-09 MED ORDER — AMLODIPINE BESYLATE 5 MG PO TABS
10.0000 mg | ORAL_TABLET | Freq: Every day | ORAL | Status: DC
  Administered 2021-01-10: 10 mg via ORAL
  Filled 2021-01-09: qty 2

## 2021-01-09 MED ORDER — ACETAMINOPHEN 325 MG PO TABS: 650.0000 mg | ORAL_TABLET | Freq: Four times a day (QID) | ORAL | Status: DC | PRN

## 2021-01-09 MED ORDER — FLUTICASONE FUROATE-VILANTEROL 200-25 MCG/INH IN AEPB
1.0000 | INHALATION_SPRAY | Freq: Every day | RESPIRATORY_TRACT | Status: DC
  Administered 2021-01-10: 1 via RESPIRATORY_TRACT
  Filled 2021-01-09: qty 28

## 2021-01-09 MED ORDER — NICOTINE 21 MG/24HR TD PT24
21.0000 mg | MEDICATED_PATCH | Freq: Every day | TRANSDERMAL | Status: DC
  Filled 2021-01-09 (×2): qty 1

## 2021-01-09 MED ORDER — PANTOPRAZOLE SODIUM 40 MG PO TBEC
40.0000 mg | DELAYED_RELEASE_TABLET | Freq: Every day | ORAL | Status: DC
  Administered 2021-01-09 – 2021-01-10 (×2): 40 mg via ORAL
  Filled 2021-01-09 (×2): qty 1

## 2021-01-09 MED ORDER — THIAMINE HCL 100 MG PO TABS
100.0000 mg | ORAL_TABLET | Freq: Every day | ORAL | Status: DC
  Administered 2021-01-09 – 2021-01-10 (×2): 100 mg via ORAL
  Filled 2021-01-09 (×2): qty 1

## 2021-01-09 MED ORDER — ALBUTEROL SULFATE HFA 108 (90 BASE) MCG/ACT IN AERS: 2.0000 | INHALATION_SPRAY | RESPIRATORY_TRACT | Status: DC | PRN

## 2021-01-09 MED ORDER — SODIUM CHLORIDE 0.9 % IV SOLN: 250.0000 mL | INTRAVENOUS | Status: DC | PRN

## 2021-01-09 MED ORDER — HYDRALAZINE HCL 25 MG PO TABS
50.0000 mg | ORAL_TABLET | Freq: Three times a day (TID) | ORAL | Status: DC
  Administered 2021-01-09 – 2021-01-10 (×3): 50 mg via ORAL
  Filled 2021-01-09 (×3): qty 2

## 2021-01-09 MED ORDER — LORAZEPAM 2 MG/ML IJ SOLN: 1.0000 mg | INTRAMUSCULAR | Status: DC | PRN

## 2021-01-09 MED ORDER — THIAMINE HCL 100 MG PO TABS
100.0000 mg | ORAL_TABLET | Freq: Once | ORAL | Status: AC
  Administered 2021-01-09: 100 mg via ORAL
  Filled 2021-01-09: qty 1

## 2021-01-09 MED ORDER — LABETALOL HCL 5 MG/ML IV SOLN: 10.0000 mg | INTRAVENOUS | Status: DC | PRN

## 2021-01-09 MED ORDER — HYDROCHLOROTHIAZIDE 25 MG PO TABS
25.0000 mg | ORAL_TABLET | Freq: Every day | ORAL | Status: DC
  Administered 2021-01-09: 25 mg via ORAL
  Filled 2021-01-09: qty 1

## 2021-01-09 MED ORDER — SODIUM CHLORIDE 0.9% FLUSH: 3.0000 mL | INTRAVENOUS | Status: DC | PRN

## 2021-01-09 MED ORDER — ACETAMINOPHEN 650 MG RE SUPP
650.0000 mg | Freq: Four times a day (QID) | RECTAL | Status: DC | PRN
Start: 2021-01-09 — End: 2021-01-11

## 2021-01-09 MED ORDER — PAROXETINE HCL 20 MG PO TABS
40.0000 mg | ORAL_TABLET | Freq: Every day | ORAL | Status: DC
  Administered 2021-01-09 – 2021-01-10 (×2): 40 mg via ORAL
  Filled 2021-01-09 (×2): qty 2

## 2021-01-09 MED ORDER — LEVETIRACETAM 500 MG PO TABS
500.0000 mg | ORAL_TABLET | Freq: Two times a day (BID) | ORAL | Status: DC
  Administered 2021-01-09 – 2021-01-10 (×2): 500 mg via ORAL
  Filled 2021-01-09 (×2): qty 1

## 2021-01-09 MED ORDER — LISINOPRIL 10 MG PO TABS
40.0000 mg | ORAL_TABLET | Freq: Every day | ORAL | Status: DC
  Administered 2021-01-09: 40 mg via ORAL
  Filled 2021-01-09: qty 4

## 2021-01-09 MED ORDER — SODIUM CHLORIDE 0.9% FLUSH
3.0000 mL | Freq: Two times a day (BID) | INTRAVENOUS | Status: DC
  Administered 2021-01-09: 3 mL via INTRAVENOUS

## 2021-01-09 MED ORDER — LISINOPRIL-HYDROCHLOROTHIAZIDE 20-12.5 MG PO TABS: 2.0000 | ORAL_TABLET | Freq: Every morning | ORAL | Status: DC

## 2021-01-09 MED ORDER — ADULT MULTIVITAMIN W/MINERALS CH
1.0000 | ORAL_TABLET | Freq: Every day | ORAL | Status: DC
  Administered 2021-01-09 – 2021-01-10 (×2): 1 via ORAL
  Filled 2021-01-09 (×2): qty 1

## 2021-01-09 MED ORDER — AMLODIPINE BESYLATE 5 MG PO TABS
10.0000 mg | ORAL_TABLET | Freq: Once | ORAL | Status: AC
  Administered 2021-01-09: 10 mg via ORAL
  Filled 2021-01-09: qty 2

## 2021-01-09 MED ORDER — BISACODYL 10 MG RE SUPP: 10.0000 mg | Freq: Every day | RECTAL | Status: DC | PRN

## 2021-01-09 MED ORDER — AMLODIPINE BESYLATE 5 MG PO TABS: 10.0000 mg | ORAL_TABLET | Freq: Every day | ORAL | Status: DC

## 2021-01-09 MED ORDER — LEVETIRACETAM IN NACL 500 MG/100ML IV SOLN
500.0000 mg | Freq: Once | INTRAVENOUS | Status: AC
  Administered 2021-01-09: 500 mg via INTRAVENOUS
  Filled 2021-01-09: qty 100

## 2021-01-09 MED ORDER — LORAZEPAM 1 MG PO TABS: 1.0000 mg | ORAL_TABLET | ORAL | Status: DC | PRN

## 2021-01-09 MED ORDER — GABAPENTIN 100 MG PO CAPS
200.0000 mg | ORAL_CAPSULE | Freq: Three times a day (TID) | ORAL | Status: DC
  Administered 2021-01-09 – 2021-01-10 (×2): 200 mg via ORAL
  Filled 2021-01-09 (×2): qty 2

## 2021-01-09 MED ORDER — ENSURE ENLIVE PO LIQD
237.0000 mL | Freq: Two times a day (BID) | ORAL | Status: DC
  Administered 2021-01-10: 237 mL via ORAL

## 2021-01-09 MED ORDER — ISOSORBIDE MONONITRATE ER 60 MG PO TB24
30.0000 mg | ORAL_TABLET | Freq: Every day | ORAL | Status: DC
  Administered 2021-01-09: 30 mg via ORAL
  Filled 2021-01-09: qty 1

## 2021-01-09 NOTE — ED Notes (Signed)
Pt reports that he vomited about 15 min after taking po medications earlier.

## 2021-01-09 NOTE — ED Provider Notes (Signed)
[facility] EMERGENCY DEPARTMENT Provider Note   CSN: [id number] Arrival date & time: 01/09/21  1012     History No chief complaint on file.   Scott Jackson is a 70 y.o. male.  HPI  This patient is a 70 year old male with a history of alcohol abuse, hypertension, seizure disorder and a history of rectal bleeding.  He presents to the hospital today with a complaint of dizziness and feeling like he is off balance.  The patient reports to me that for months now he has had this progressive intermittent fluctuating course of feeling like he is having difficulty walking.  It will happen sometimes for days at a time and sometimes for hours at a time and sometimes he get totally better.  He states that he does drink a lot of alcohol, he last tried to drink yesterday but does endorse having increased amounts of vomiting over the last couple of weeks.  He denies weight loss, denies significant headaches, no changes in his vision.  He denies any weakness or numbness of the arms or the legs but feels like he is off balance.  The patient reports that he has been taking his medications but when I look at the medications that he brings with him they are prepackaged to be taken day by day and there are dates on the packaging.  He has medications he has not taken from April dating 2 months ago.  He then endorses that he does not take his medication regularly.  This includes amlodipine, lactulose, lisinopril, hydrochlorothiazide, gabapentin.  He is now having some urinary incontinence b/c he can't get to bathroom in time at times.  Past Medical History:  Diagnosis Date   ETOH abuse    Hypertension    Pulmonary nodule    Tobacco abuse     Patient Active Problem List   Diagnosis Date Noted   Alcohol dependence with other alcohol-induced disorder ([place]) 05/07/2020   Seizure disorder ([place]) 05/07/2020   Anorexia nervosa 10/16/2019   Loss of weight 05/13/2019   Abnormal CT scan, colon 05/13/2019   Single  seizure ([place]) 04/09/2019   Adult failure to thrive 04/09/2019   Alcoholism with alcohol dependence ([place]) 03/19/2019   Alcoholism associated with dementia ([place]) 03/19/2019   Emphysema of lung ([place]) 03/19/2019   Abdominal pain, epigastric    Hemorrhoid 09/28/2018   Rectal bleeding 09/28/2018   Constipation 09/28/2018   Screen for sexually transmitted diseases 11/24/2017   Hyperlipidemia 11/24/2017   HTN, goal below 140/90 11/24/2017   Screening for viral disease 11/24/2017   Alcohol abuse 11/24/2017   Alcohol abuse counseling and surveillance of alcoholic 78/29/5621    Past Surgical History:  Procedure Laterality Date   ANKLE FRACTURE SURGERY     APPENDECTOMY     BIOPSY  01/08/2019   Procedure: BIOPSY;  Surgeon: Danie Binder, MD;  Location: [facility];  Service: Endoscopy;;  gastric   COLONOSCOPY  01/2015   Dr. Posey Pronto: two 2-53mm rectal polyps removed, hyperplastic   COLONOSCOPY  2007   Dr. Posey Pronto: three adenomas removed measuring 4, 10, 57mm. diverticulosis   COLONOSCOPY WITH PROPOFOL N/A 01/08/2019   Dr. Oneida Alar: Severe diverticulosis with fixed rectosigmoid region, rectal bleeding likely due to internal hemorrhoids.  Recommended repeat colonoscopy at [facility] because examination was incomplete due to fixed rectosigmoid colon (patient never followed through).   ESOPHAGOGASTRODUODENOSCOPY (EGD) WITH PROPOFOL N/A 01/08/2019   Dr. Oneida Alar: Low-grade narrowing Schatzki ring at the GE junction, medium sized hiatal hernia,  erosive gastritis/duodenitis in the setting of NSAID and alcohol use.  Gastric biopsy showed gastropathy but no H. pylori   intestininal blockage  1970       Family History  Problem Relation Age of Onset   Hypertension Mother    Stroke Mother    Fibromyalgia Daughter    Lupus Daughter    Anxiety disorder Daughter    Post-traumatic stress disorder Daughter    Asthma Daughter    Breast cancer Sister    Breast cancer Sister    Colon cancer Neg Hx      Social History   Tobacco Use   Smoking status: Every Day    Packs/day: 0.25    Years: 50.00    Pack years: 12.50    Types: Cigarettes   Smokeless tobacco: Never  Vaping Use   Vaping Use: Never used  Substance Use Topics   Alcohol use: Yes    Comment: drank drink one week ago   Drug use: No    Home Medications Prior to Admission medications   Medication Sig Start Date End Date Taking? Authorizing Provider  amLODipine (NORVASC) 10 MG tablet Take 10 mg by mouth daily. 11/20/18   [provider]  Cyanocobalamin (B-12) 500 MCG SUBL DISSOLVE UNDER TONGUE ONCE DAILY AS DIRECTED 08/30/19   [provider]  gabapentin (NEURONTIN) 100 MG capsule TAKE ONE CAPSULE BY MOUTH EVERY MORNING and TAKE TWO CAPSULES BY MOUTH EVERYDAY AT BEDTIME 12/14/20   Dohmeier, Asencion Partridge, MD  lactulose (CHRONULAC) 10 GM/15ML solution Take 30 mLs (20 g total) by mouth 2 (two) times daily. To have 2-3 soft stools daily to keep ammonia down and stay mentally clear. Patient not taking: Reported on 09/11/2020 05/07/20   Dohmeier, Asencion Partridge, MD  levETIRAcetam (KEPPRA) 500 MG tablet Take 1 tablet (500 mg total) by mouth 2 (two) times daily. Patient not taking: Reported on 09/11/2020 05/07/20   Dohmeier, Asencion Partridge, MD  lisinopril-hydrochlorothiazide (ZESTORETIC) 20-12.5 MG tablet Take 2 tablets by mouth every morning. 07/28/20   [provider]  Multiple Vitamin (MULTIVITAMIN WITH MINERALS) TABS tablet Take 1 tablet by mouth daily.    [provider]  PARoxetine (PAXIL) 40 MG tablet Take 40 mg by mouth every morning.    [provider]  predniSONE (STERAPRED UNI-PAK 21 TAB) 10 MG (21) TBPK tablet Take 6 tabs by mouth daily  for 2 days, then 5 tabs for 2 days, then 4 tabs for 2 days, then 3 tabs for 2 days, 2 tabs for 2 days, then 1 tab by mouth daily for 2 days Patient not taking: Reported on 09/11/2020 10/28/19   Emerson Monte, FNP    Allergies    Patient has no known  allergies.  Review of Systems   Review of Systems  All other systems reviewed and are negative.  Physical Exam Updated Vital Signs There were no vitals taken for this visit.  Physical Exam Vitals and nursing note reviewed.  Constitutional:      General: He is not in acute distress.    Appearance: He is well-developed.  HENT:     Head: Normocephalic and atraumatic.     Nose: No congestion.     Mouth/Throat:     Mouth: Mucous membranes are moist.     Pharynx: No oropharyngeal exudate.  Eyes:     General: No scleral icterus.       Right eye: No discharge.        Left eye: No discharge.  Conjunctiva/sclera: Conjunctivae normal.     Pupils: Pupils are equal, round, and reactive to light.     Comments: No jaundice  Neck:     Thyroid: No thyromegaly.     Vascular: No JVD.  Cardiovascular:     Rate and Rhythm: Normal rate and regular rhythm.     Heart sounds: Normal heart sounds. No murmur heard.   No friction rub. No gallop.  Pulmonary:     Effort: Pulmonary effort is normal. No respiratory distress.     Breath sounds: Normal breath sounds. No wheezing or rales.  Abdominal:     General: Bowel sounds are normal. There is no distension.     Palpations: Abdomen is soft. There is no mass.     Tenderness: There is no abdominal tenderness.     Comments: Nontender abdomen  Musculoskeletal:        General: No tenderness. Normal range of motion.     Cervical back: Normal range of motion and neck supple.  Lymphadenopathy:     Cervical: No cervical adenopathy.  Skin:    General: Skin is warm and dry.     Findings: No erythema or rash.  Neurological:     Mental Status: He is alert.     Coordination: Coordination normal.     Comments: Clear speech, no tremor, normal strength in all 4 extremities, cranial nerves III through XII are normal, mentation is normal  Psychiatric:        Behavior: Behavior normal.    ED Results / Procedures / Treatments   Labs (all labs ordered are  listed, but only abnormal results are displayed) Labs Reviewed  MAGNESIUM - Abnormal; Notable for the following components:      Result Value   Magnesium 1.6 (*)    All other components within normal limits  COMPREHENSIVE METABOLIC PANEL - Abnormal; Notable for the following components:   Chloride 92 (*)    Glucose, Bld 120 (*)    Alkaline Phosphatase 37 (*)    Anion gap 17 (*)    All other components within normal limits  CBC WITH DIFFERENTIAL/PLATELET - Abnormal; Notable for the following components:   Hemoglobin 12.6 (*)    HCT 38.5 (*)    RDW 16.2 (*)    All other components within normal limits  AMMONIA  ETHANOL  VITAMIN B12  LIPASE, BLOOD    EKG None  Radiology No results found.  Procedures Procedures   Medications Ordered in ED Medications  lisinopril-hydrochlorothiazide (ZESTORETIC) 20-12.5 MG per tablet 2 tablet (has no administration in time range)  levETIRAcetam (KEPPRA) IVPB 500 mg/100 mL premix (has no administration in time range)  0.9 %  sodium chloride infusion (has no administration in time range)  amLODipine (NORVASC) tablet 10 mg (has no administration in time range)  thiamine tablet 100 mg (has no administration in time range)    ED Course  I have reviewed the triage vital signs and the nursing notes.  Pertinent labs & imaging results that were available during my care of the patient were reviewed by me and considered in my medical decision making (see chart for details).    MDM Rules/Calculators/A&P                          Blood pressure is 200/106, he is clearly not compliant with his medications.  He is having increasing amounts of feeling off balance and I am concerned that given his alcohol history he  may have had a fall which may have caused him to have injury to his brain.  We will get a CT scan of the brain to make sure there is no signs of subdural hematoma.  We will also check labs thiamine will be dosed and the patient will have a  laboratory evaluation.  Labs reveal low magnesium, patient has been given fluid and thiamine  The patient has no delirium, continues to have nausea and vomiting, unclear etiology, discussed with family who states that he has been having frequent syncopal episodes, admitted to hospitalist for observation and evaluation of syncope  Final Clinical Impression(s) / ED Diagnoses Final diagnoses:  Syncope, unspecified syncope type  Alcohol abuse  Hypomagnesemia     Noemi Chapel, MD 01/09/21 1436

## 2021-01-09 NOTE — ED Notes (Signed)
Spoke with daughter on phone.  She states that pt has had multiple episodes of passing out at home and falling and hitting head.

## 2021-01-09 NOTE — H&P (Signed)
Patient Demographics:    Scott Jackson, is a 70 y.o. male  MRN: [id number]   DOB - Jun 15, 1951  Admit Date - 01/09/2021  Outpatient Primary MD for the patient is Caren Macadam, MD   Assessment & Plan:    Principal Problem:   Syncope Active Problems:   Alcohol abuse   COPD/Emphysema of lung (HCC)   HTN, goal below 140/90   Adult failure to thrive   Tobacco abuse  1)Syncope-recurrent syncope, not new but more frequent lately  ---admit to telemetry monitored unit, watch for arrhythmias, check serial troponins and EKG for rule out ACS protocol, check echocardiogram to rule out significant aortic stenosis or other outflow obstruction, and also to evaluate EF and to rule out segmental/Regional wall motion abnormalities. Check carotid artery Dopplers to rule out hemodynamically significant stenosis -Echo from March 2021 with preserved EF of 50 to 55% -Patient had a 14-day Holter monitor back in March 2021 without significant arrhythmia -Check TSH --No concerns about seizures or loss of consciousness -CT head negative  2)Recurrent falls---PTA pt lived alone and did very poorly, patient has significant limitations with mobility related ADLs- this patient needs to continue to be monitored in the hospital until a SNF bed is obtained as she is not safe to go home with her current physcical limitations -Await physical therapy evaluation  3)Alcohol Abuse--as per patient's daughter patient drinks at least a bottle of vodka daily --lorazepam per CIWA protocol, thiamine and folic acid as ordered -This is probably contributing to patient's falls/syncope  4)COPD/Emphysema/Tobacco Abuse--- [place] Patter and nicotine patch as ordered -Albuterol as needed  5)HTN--stage II, uncontrolled and the patient was noncompliant -Give  amlodipine 10 mg daily, hydralazine 50 mg 3 times daily, Imdur 30 mg daily, may use IV labetalol as needed elevated BP   6)CAD incidental finding on CT and neg ETT in 2019.  monitor on telemetry unit await repeat echo  -Check fasting lipid profile   7) hypomagnesemia in an alcoholic male-----replace and recheck  8) intractable emesis--- in an alcoholic male, lipase WNL, -Antiemetics as needed ,Protonix for GI protection  9) normocytic and normochromic anemia--???  Etiology, -Anemia work-up requested including FOBT  Disposition/Need for in-Hospital Stay- patient unable to be discharged at this time due to recurrent falls/syncope requiring further work-up including echo and Dopplers, intractable emesis requiring IV antiemetics and IV fluids*  ispo: The patient is from: Home              Anticipated d/c is to: SNF              Anticipated d/c date is: 2 days              Patient currently is not medically stable to d/c. Barriers: Not Clinically Stable-    With History of - Reviewed by me  Past Medical History:  Diagnosis Date   ETOH abuse    Hypertension    Pulmonary nodule  Tobacco abuse       Past Surgical History:  Procedure Laterality Date   ANKLE FRACTURE SURGERY     APPENDECTOMY     BIOPSY  01/08/2019   Procedure: BIOPSY;  Surgeon: Danie Binder, MD;  Location: [facility];  Service: Endoscopy;;  gastric   COLONOSCOPY  01/2015   Dr. Posey Pronto: two 2-64mm rectal polyps removed, hyperplastic   COLONOSCOPY  2007   Dr. Posey Pronto: three adenomas removed measuring 4, 10, 70mm. diverticulosis   COLONOSCOPY WITH PROPOFOL N/A 01/08/2019   Dr. Oneida Alar: Severe diverticulosis with fixed rectosigmoid region, rectal bleeding likely due to internal hemorrhoids.  Recommended [facility] colonoscopy at [facility] because examination was incomplete due to fixed rectosigmoid colon (patient never followed through).   ESOPHAGOGASTRODUODENOSCOPY (EGD) WITH PROPOFOL N/A 01/08/2019   Dr. Oneida Alar:  Low-grade narrowing Schatzki ring at the GE junction, medium sized hiatal hernia, erosive gastritis/duodenitis in the setting of NSAID and alcohol use.  Gastric biopsy showed gastropathy but no H. pylori   intestininal blockage  1970      Chief Complaint  Patient presents with   Vomiting      HPI:    Scott Jackson  is a 70 y.o. male with past medical history relevant for CAD, COPD, ongoing tobacco and alcohol abuse, history of recurrent falls/syncope more frequent lately in the setting of heavy EtOH use presented to the ED with intractable emesis and episodes of passing out at home -Denies chest pains palpitations -Emesis is without bile or blood -Diarrhea, no pleuritic symptoms -Additional history obtained from patient's daughter [name] at bedside today is concerned about patient's alcohol use and episodes of syncope -No concerns about seizures or loss of consciousness -No fever  Or chills  -In the ED mag is low, lipase WNL, ammonia is not elevated, B12 WNL LFTs WNL, Creatinine 0.98 BAL <10, Hgb 12.6 CT head Neg -Covid Test pending    Review of systems:    In addition to the HPI above,   A full Review of  Systems was done, all other systems reviewed are negative except as noted above in HPI , .    Social History:  Reviewed by me    Social History   Tobacco Use   Smoking status: Every Day    Packs/day: 0.25    Years: 50.00    Pack years: 12.50    Types: Cigarettes   Smokeless tobacco: Never  Substance Use Topics   Alcohol use: Yes    Comment: drank drink one week ago       Family History :  Reviewed by me    Family History  Problem Relation Age of Onset   Hypertension Mother    Stroke Mother    Fibromyalgia Daughter    Lupus Daughter    Anxiety disorder Daughter    Post-traumatic stress disorder Daughter    Asthma Daughter    Breast cancer Sister    Breast cancer Sister    Colon cancer Neg Hx      Home Medications:   Prior to Admission  medications   Medication Sig Start Date End Date Taking? Authorizing Provider  amLODipine (NORVASC) 10 MG tablet Take 10 mg by mouth daily. 11/20/18  Yes [provider]  gabapentin (NEURONTIN) 100 MG capsule TAKE ONE CAPSULE BY MOUTH EVERY MORNING and TAKE TWO CAPSULES BY MOUTH EVERYDAY AT BEDTIME 12/14/20  Yes Dohmeier, Asencion Partridge, MD  levETIRAcetam (KEPPRA) 500 MG tablet Take 1 tablet (500 mg total) by mouth 2 (two)  times daily. 05/07/20  Yes Dohmeier, Asencion Partridge, MD  lisinopril-hydrochlorothiazide (ZESTORETIC) 20-12.5 MG tablet Take 2 tablets by mouth every morning. 07/28/20  Yes [provider]  Multiple Vitamin (MULTIVITAMIN WITH MINERALS) TABS tablet Take 1 tablet by mouth daily.   Yes [provider]  PARoxetine (PAXIL) 40 MG tablet Take 40 mg by mouth every morning.   Yes [provider]  tamsulosin (FLOMAX) 0.4 MG CAPS capsule Take 0.4 mg by mouth daily. 12/23/20  Yes [provider]  lactulose (CHRONULAC) 10 GM/15ML solution Take 30 mLs (20 g total) by mouth 2 (two) times daily. To have 2-3 soft stools daily to keep ammonia down and stay mentally clear. Patient not taking: No sig reported 05/07/20   Dohmeier, Asencion Partridge, MD  predniSONE (STERAPRED UNI-PAK 21 TAB) 10 MG (21) TBPK tablet Take 6 tabs by mouth daily  for 2 days, then 5 tabs for 2 days, then 4 tabs for 2 days, then 3 tabs for 2 days, 2 tabs for 2 days, then 1 tab by mouth daily for 2 days Patient not taking: No sig reported 10/28/19   Emerson Monte, FNP     Allergies:    No Known Allergies   Physical Exam:   Vitals  Blood pressure (!) 143/100, pulse 64, temperature 97.8 F (36.6 C), temperature source Oral, resp. rate 16, height 6\' 1"  (1.854 m), weight 56.7 kg, SpO2 100 %.  Physical Examination: General appearance - alert, thin, frail appearing, and in no distress  Mental status - alert, oriented to person, place, and time,  Eyes - sclera anicteric Neck - supple, no JVD elevation  , Chest - clear  to auscultation bilaterally, symmetrical air movement,  Heart - S1 and S2 normal, regular  Abdomen - soft, nontender, nondistended, no masses or organomegaly Neurological - screening mental status exam normal, neck supple without rigidity, cranial nerves II through XII intact, DTR's normal and symmetric Extremities - no pedal edema noted, intact peripheral pulses  Skin - warm, dry     Data Review:    CBC Recent Labs  Lab 01/09/21 1130  WBC 4.2  HGB 12.6*  HCT 38.5*  PLT 252  MCV 89.7  MCH 29.4  MCHC 32.7  RDW 16.2*  LYMPHSABS 1.4  MONOABS 0.6  EOSABS 0.0  BASOSABS 0.0   ------------------------------------------------------------------------------------------------------------------  Chemistries  Recent Labs  Lab 01/09/21 1130  NA 136  K 3.8  CL 92*  CO2 27  GLUCOSE 120*  BUN 18  CREATININE 0.98  CALCIUM 9.9  MG 1.6*  AST 22  ALT 12  ALKPHOS 37*  BILITOT 0.8   ------------------------------------------------------------------------------------------------------------------ estimated creatinine clearance is 57.1 mL/min (by C-G formula based on SCr of 0.98 mg/dL). ------------------------------------------------------------------------------------------------------------------ No results for input(s): TSH, T4TOTAL, T3FREE, THYROIDAB in the last 72 hours.  Invalid input(s): FREET3   Coagulation profile No results for input(s): INR, PROTIME in the last 168 hours. ------------------------------------------------------------------------------------------------------------------- No results for input(s): DDIMER in the last 72 hours. -------------------------------------------------------------------------------------------------------------------  Cardiac Enzymes No results for input(s): CKMB, TROPONINI, MYOGLOBIN in the last 168 hours.  Invalid input(s):  CK ------------------------------------------------------------------------------------------------------------------ No results found for: BNP   ---------------------------------------------------------------------------------------------------------------  Urinalysis    Component Value Date/Time   COLORURINE YELLOW 09/11/2020 1357   APPEARANCEUR HAZY (A) 09/11/2020 1357   LABSPEC 1.023 09/11/2020 1357   PHURINE 5.0 09/11/2020 1357   GLUCOSEU NEGATIVE 09/11/2020 1357   [place] 09/11/2020 1357   BILIRUBINUR NEGATIVE 09/11/2020 1357   KETONESUR 5 (A) 09/11/2020 1357   PROTEINUR 100 (  A) 09/11/2020 1357   NITRITE NEGATIVE 09/11/2020 1357   [place] 09/11/2020 1357    ----------------------------------------------------------------------------------------------------------------   Imaging Results:    CT Head Wo Contrast  Result Date: 01/09/2021 CLINICAL DATA:  Dizziness abnormal gait EXAM: CT HEAD WITHOUT CONTRAST TECHNIQUE: Contiguous axial images were obtained from the base of the skull through the vertex without intravenous contrast. COMPARISON:  10/26/2019 FINDINGS: Brain: Generalized atrophy unchanged. Negative for hydrocephalus. Mild white matter hypodensity bilaterally appears unchanged Negative for acute infarct, hemorrhage, mass. Vascular: Negative for hyperdense vessel Skull: Negative Sinuses/Orbits: Negative Other: None IMPRESSION: No acute abnormality. Generalized atrophy with mild microvascular ischemic changes in the white matter. Electronically Signed   By: Franchot Gallo M.D.   On: 01/09/2021 11:48    Radiological Exams on Admission: CT Head Wo Contrast  Result Date: 01/09/2021 CLINICAL DATA:  Dizziness abnormal gait EXAM: CT HEAD WITHOUT CONTRAST TECHNIQUE: Contiguous axial images were obtained from the base of the skull through the vertex without intravenous contrast. COMPARISON:  10/26/2019 FINDINGS: Brain: Generalized atrophy unchanged. Negative  for hydrocephalus. Mild white matter hypodensity bilaterally appears unchanged Negative for acute infarct, hemorrhage, mass. Vascular: Negative for hyperdense vessel Skull: Negative Sinuses/Orbits: Negative Other: None IMPRESSION: No acute abnormality. Generalized atrophy with mild microvascular ischemic changes in the white matter. Electronically Signed   By: Franchot Gallo M.D.   On: 01/09/2021 11:48    DVT Prophylaxis -SCD/heparin AM Labs Ordered, also please review Full Orders  Family Communication: Admission, patients condition and plan of care including tests being ordered have been discussed with the patient and daughter [name]* who indicate understanding and agree with the plan   Code Status - Full Code  Likely DC to  SNF vs Home with [facility]  Condition   stable  Roxan Hockey M.D on 01/09/2021 at 5:47 PM Go to [email or web address] -  for contact info  [facility] - Office  [phone]

## 2021-01-09 NOTE — ED Notes (Signed)
1 episode of vomiting, large amount

## 2021-01-09 NOTE — ED Triage Notes (Signed)
Patient's c/o nausea and vomiting that started yesterday and is progressively worse. Denies any fevers or diarrhea. Patient unsure of last BM. Per patient urinary frequency. Patient states now dizzy and feels like the room is spinning. Denies any blood in stool or emesis. Vomited x4.

## 2021-01-09 NOTE — Plan of Care (Signed)

## 2021-01-10 ENCOUNTER — Observation Stay (HOSPITAL_COMMUNITY): Payer: Medicare HMO

## 2021-01-10 ENCOUNTER — Observation Stay (HOSPITAL_BASED_OUTPATIENT_CLINIC_OR_DEPARTMENT_OTHER): Payer: Medicare HMO

## 2021-01-10 DIAGNOSIS — I10 Essential (primary) hypertension: Secondary | ICD-10-CM | POA: Diagnosis not present

## 2021-01-10 DIAGNOSIS — Z87891 Personal history of nicotine dependence: Secondary | ICD-10-CM | POA: Diagnosis not present

## 2021-01-10 DIAGNOSIS — F1010 Alcohol abuse, uncomplicated: Secondary | ICD-10-CM | POA: Diagnosis not present

## 2021-01-10 DIAGNOSIS — J439 Emphysema, unspecified: Secondary | ICD-10-CM | POA: Diagnosis not present

## 2021-01-10 DIAGNOSIS — R627 Adult failure to thrive: Secondary | ICD-10-CM | POA: Diagnosis not present

## 2021-01-10 DIAGNOSIS — Z720 Tobacco use: Secondary | ICD-10-CM | POA: Diagnosis not present

## 2021-01-10 DIAGNOSIS — R55 Syncope and collapse: Secondary | ICD-10-CM | POA: Diagnosis not present

## 2021-01-10 DIAGNOSIS — R42 Dizziness and giddiness: Secondary | ICD-10-CM | POA: Diagnosis not present

## 2021-01-10 DIAGNOSIS — R69 Illness, unspecified: Secondary | ICD-10-CM | POA: Diagnosis not present

## 2021-01-10 LAB — BASIC METABOLIC PANEL
Anion gap: 11 (ref 5–15)
BUN: 16 mg/dL (ref 8–23)
CO2: 27 mmol/L (ref 22–32)
Calcium: 8.9 mg/dL (ref 8.9–10.3)
Chloride: 96 mmol/L — ABNORMAL LOW (ref 98–111)
Creatinine, Ser: 0.92 mg/dL (ref 0.61–1.24)
GFR, Estimated: >60 mL/min (ref >60)
Glucose, Bld: 89 mg/dL (ref 70–99)
Potassium: 3.4 mmol/L — ABNORMAL LOW (ref 3.5–5.1)
Sodium: 134 mmol/L — ABNORMAL LOW (ref 135–145)

## 2021-01-10 LAB — ECHOCARDIOGRAM COMPLETE
AR max vel: 2.63 cm2
AV Area VTI: 2.97 cm2
AV Area mean vel: 2.85 cm2
AV Mean grad: 1.5 mmHg
AV Peak grad: 3.4 mmHg
Ao pk vel: 0.92 m/s
Area-P 1/2: 3.13 cm2
Height: 73 in
Weight: 2000 oz

## 2021-01-10 LAB — IRON AND TIBC
Iron: 74 ug/dL (ref 45–182)
Saturation Ratios: 22 % (ref 17.9–39.5)
TIBC: 330 ug/dL (ref 250–450)
UIBC: 256 ug/dL

## 2021-01-10 LAB — CORTISOL-AM, BLOOD: Cortisol - AM: 16.8 ug/dL (ref 6.7–22.6)

## 2021-01-10 LAB — FOLATE: Folate: 16.5 ng/mL (ref >5.9)

## 2021-01-10 LAB — CBC
HCT: 31.6 % — ABNORMAL LOW (ref 39.0–52.0)
Hemoglobin: 10.5 g/dL — ABNORMAL LOW (ref 13.0–17.0)
MCH: 30 pg (ref 26.0–34.0)
MCHC: 33.2 g/dL (ref 30.0–36.0)
MCV: 90.3 fL (ref 80.0–100.0)
Platelets: 206 10*3/uL (ref 150–400)
RBC: 3.5 MIL/uL — ABNORMAL LOW (ref 4.22–5.81)
RDW: 16.2 % — ABNORMAL HIGH (ref 11.5–15.5)
WBC: 3.8 10*3/uL — ABNORMAL LOW (ref 4.0–10.5)
nRBC: 0.5 % — ABNORMAL HIGH (ref 0.0–0.2)

## 2021-01-10 LAB — TSH: TSH: 1.138 u[IU]/mL (ref 0.350–4.500)

## 2021-01-10 LAB — HIV ANTIBODY (ROUTINE TESTING W REFLEX): HIV Screen 4th Generation wRfx: NONREACTIVE

## 2021-01-10 LAB — FERRITIN: Ferritin: 56 ng/mL (ref 24–336)

## 2021-01-10 MED ORDER — LEVETIRACETAM 500 MG PO TABS: 500.0000 mg | ORAL_TABLET | Freq: Two times a day (BID) | ORAL | 3 refills | Status: DC

## 2021-01-10 MED ORDER — HYDRALAZINE HCL 50 MG PO TABS: 50.0000 mg | ORAL_TABLET | Freq: Three times a day (TID) | ORAL | 3 refills | Status: DC

## 2021-01-10 MED ORDER — ACETAMINOPHEN 325 MG PO TABS
650.0000 mg | ORAL_TABLET | Freq: Four times a day (QID) | ORAL | 0 refills | Status: DC | PRN
Start: 2021-01-10 — End: 2023-06-19

## 2021-01-10 MED ORDER — LACTULOSE 10 GM/15ML PO SOLN: 20.0000 g | Freq: Every day | ORAL | 0 refills | Status: DC

## 2021-01-10 MED ORDER — THIAMINE HCL 100 MG PO TABS: 100.0000 mg | ORAL_TABLET | Freq: Every day | ORAL | 3 refills | Status: DC

## 2021-01-10 MED ORDER — ADULT MULTIVITAMIN W/MINERALS CH: 1.0000 | ORAL_TABLET | Freq: Every day | ORAL | 3 refills | Status: DC

## 2021-01-10 MED ORDER — LISINOPRIL-HYDROCHLOROTHIAZIDE 20-12.5 MG PO TABS: 1.0000 | ORAL_TABLET | Freq: Every morning | ORAL | 3 refills | Status: DC

## 2021-01-10 MED ORDER — FLUTICASONE FUROATE-VILANTEROL 200-25 MCG/INH IN AEPB
1.0000 | INHALATION_SPRAY | Freq: Every day | RESPIRATORY_TRACT | 3 refills | Status: DC
Start: 2021-01-11 — End: 2022-01-08

## 2021-01-10 MED ORDER — POLYETHYLENE GLYCOL 3350 17 G PO PACK
17.0000 g | PACK | Freq: Every day | ORAL | Status: DC
  Administered 2021-01-10: 17 g via ORAL
  Filled 2021-01-10: qty 1

## 2021-01-10 MED ORDER — PANTOPRAZOLE SODIUM 40 MG PO TBEC: 40.0000 mg | DELAYED_RELEASE_TABLET | Freq: Every day | ORAL | 3 refills | Status: DC

## 2021-01-10 MED ORDER — AMLODIPINE BESYLATE 10 MG PO TABS: 10.0000 mg | ORAL_TABLET | Freq: Every day | ORAL | 3 refills | Status: DC

## 2021-01-10 MED ORDER — NICOTINE 21 MG/24HR TD PT24: 21.0000 mg | MEDICATED_PATCH | Freq: Every day | TRANSDERMAL | 0 refills | Status: DC

## 2021-01-10 MED ORDER — PAROXETINE HCL 40 MG PO TABS: 40.0000 mg | ORAL_TABLET | ORAL | 3 refills | Status: DC

## 2021-01-10 MED ORDER — ISOSORBIDE MONONITRATE ER 30 MG PO TB24: 30.0000 mg | ORAL_TABLET | Freq: Every day | ORAL | 3 refills | Status: DC

## 2021-01-10 MED ORDER — TAMSULOSIN HCL 0.4 MG PO CAPS: 0.4000 mg | ORAL_CAPSULE | Freq: Every day | ORAL | 3 refills | Status: DC

## 2021-01-10 MED ORDER — SENNOSIDES-DOCUSATE SODIUM 8.6-50 MG PO TABS: 2.0000 | ORAL_TABLET | Freq: Every day | ORAL | Status: DC

## 2021-01-10 MED ORDER — FOLIC ACID 1 MG PO TABS: 1.0000 mg | ORAL_TABLET | Freq: Every day | ORAL | 5 refills | Status: DC

## 2021-01-10 MED ORDER — ALBUTEROL SULFATE HFA 108 (90 BASE) MCG/ACT IN AERS: 2.0000 | INHALATION_SPRAY | RESPIRATORY_TRACT | 1 refills | Status: DC | PRN

## 2021-01-10 MED ORDER — GABAPENTIN 300 MG PO CAPS: 300.0000 mg | ORAL_CAPSULE | Freq: Three times a day (TID) | ORAL | 3 refills | Status: DC

## 2021-01-10 NOTE — Progress Notes (Signed)
  Echocardiogram 2D Echocardiogram has been performed.  Fidel Levy 01/10/2021, 1:55 PM

## 2021-01-10 NOTE — Evaluation (Signed)
Physical Therapy Evaluation Patient Details Name: Scott Jackson MRN: [id number] DOB: 10-01-1950 Today's Date: 01/10/2021   History of Present Illness  Scott Jackson  is a 70 y.o. male with past medical history relevant for CAD, COPD, ongoing tobacco and alcohol abuse, history of recurrent falls/syncope more frequent lately in the setting of heavy EtOH use presented to the ED with intractable emesis and episodes of passing out at home   Clinical Impression   Patient demonstrates independent bed mobility and transfer capabilities without adverse effects and no instance of orthostatic hypotension experienced. [facility] staff and MD present for orthostatic assessment and pt denies any provoking symptoms with position change.  Proceeded to gait assessment and pt demo two instances of unsteadiness of gait with staggering to the right which he recovered vis touch support on wall assist rail.  Able to proceed with additional ambulation x 350 ft without exertion and no further LOB albeit with slight gait deviation related to chronic condition(s).  TUG test performed and reveals time of 16 sec indicating high risk for falls per age category.  Pt would benefit from use of single point cane for balance and steadying support during gait. Patient discharged to care of nursing for ambulation daily as tolerated for length of stay.     Follow Up Recommendations Outpatient PT    Equipment Recommendations  Cane    Recommendations for Other Services       Precautions / Restrictions Precautions Precautions: Fall Restrictions Weight Bearing Restrictions: No      Mobility  Bed Mobility Overal bed mobility: Independent                  Transfers Overall transfer level: Independent Equipment used: None                Ambulation/Gait Ambulation/Gait assistance: Counsellor (Feet): 350 Feet Assistive device: None Gait Pattern/deviations: Staggering right Gait velocity:  decreased Gait velocity interpretation: 1.31 - 2.62 ft/sec, indicative of limited community ambulator General Gait Details: two instances of unsteadiness recovered by touch support on wall railing  Stairs Stairs:  (IV tx running, unable to trial stairs)          Wheelchair Mobility    Modified Rankin (Stroke Patients Only)       Balance                                 Standardized Balance Assessment Standardized Balance Assessment : TUG: Timed Up and Go Test     Timed Up and Go Test TUG: Normal TUG Normal TUG (seconds): 16     Pertinent Vitals/Pain Pain Assessment: No/denies pain    Home Living Family/patient expects to be discharged to:: Private residence Living Arrangements: Parent Available Help at Discharge: Family Type of Home: House Home Access: Level entry (front stoop)     Home Layout: One level Home Equipment: None      Prior Function Level of Independence: Independent               Hand Dominance        Extremity/Trunk Assessment   Upper Extremity Assessment Upper Extremity Assessment: Generalized weakness    Lower Extremity Assessment Lower Extremity Assessment: Generalized weakness    Cervical / Trunk Assessment Cervical / Trunk Assessment: Normal  Communication   Communication: No difficulties  Cognition Arousal/Alertness: Awake/alert Behavior During Therapy: WFL for tasks assessed/performed  General Comments      Exercises     Assessment/Plan    PT Assessment All further PT needs can be met in the next venue of care  PT Problem List Decreased strength;Decreased balance;Decreased knowledge of use of DME;Decreased activity tolerance       PT Treatment Interventions      PT Goals (Current goals can be found in the Care Plan section)  Acute Rehab PT Goals Patient Stated Goal: "quite drinking and not have anymore falls" PT Goal Formulation:  With patient Time For Goal Achievement: 01/10/21 Potential to Achieve Goals: Good    Frequency     Barriers to discharge        Co-evaluation               AM-PAC PT "6 Clicks" Mobility  Outcome Measure Help needed turning from your back to your side while in a flat bed without using bedrails?: None Help needed moving from lying on your back to sitting on the side of a flat bed without using bedrails?: None Help needed moving to and from a bed to a chair (including a wheelchair)?: None Help needed standing up from a chair using your arms (e.g., wheelchair or bedside chair)?: None Help needed to walk in hospital room?: A Little Help needed climbing 3-5 steps with a railing? : A Little 6 Click Score: 22    End of Session Equipment Utilized During Treatment: Gait belt Activity Tolerance: Patient tolerated treatment well Patient left: in bed;with call bell/phone within reach;with bed alarm set Nurse Communication: Mobility status PT Visit Diagnosis: Unsteadiness on feet (R26.81);Other abnormalities of gait and mobility (R26.89);Repeated falls (R29.6);Muscle weakness (generalized) (M62.81)    Time: 0900-0935 PT Time Calculation (min) (ACUTE ONLY): 35 min   Charges:   PT Evaluation $PT Eval Low Complexity: 1 Low PT Treatments $Gait Training: 8-22 mins $Therapeutic Activity: 8-22 mins       10:24 AM, 01/10/21 M. Kelly , PT, DPT Physical Therapist- [place] Office Number: [phone]    

## 2021-01-10 NOTE — TOC Transition Note (Signed)
Transition of Care [facility]) - CM/SW Discharge Note   Patient Details  Name: Scott Jackson MRN: [id number] Date of Birth: March 14, 1951  [facility] CM/SW Contact:  Natasha Bence, LCSW Phone Number: 01/10/2021, 3:15 PM   Clinical Narrative:    CSW received SA consult. CSW provided SA resources to patient. Patient agreeable to review resources. TOC signing off.          Patient Goals and CMS Choice        Discharge Placement                       Discharge Plan and Services                                     Social Determinants of Health (SDOH) Interventions     Readmission Risk Interventions No flowsheet data found.

## 2021-01-10 NOTE — Discharge Instructions (Signed)
1) complete abstinence from tobacco and alcohol strongly advised 2) outpatient alcohol rehab programs advised--- please read information given to you 3) please take your blood pressure and seizure medications as prescribed 4) outpatient physical therapy advised to help you get stronger and reduce your risk of falling

## 2021-01-10 NOTE — Plan of Care (Signed)
  Problem: Acute Rehab PT Goals(only PT should resolve) Goal: PT Additional Goal #1 Outcome: Completed/Met Flowsheets (Taken 01/10/2021 1025) Additional Goal #1: Demo improved balance and gait capabilities with use of single point cane   10:26 AM, 01/10/21 M. Sherlyn Lees, PT, DPT Physical Therapist- [place] Office Number: [phone]

## 2021-01-10 NOTE — Discharge Summary (Addendum)
Scott Jackson, is a 70 y.o. male  DOB 1951-06-25  MRN [id number].  Admission date:  01/09/2021  Admitting Physician  Sreeja Spies Denton Brick, MD  Discharge Date:  01/10/2021   Primary MD  Caren Macadam, MD  Recommendations for primary care physician for things to follow:   1) complete abstinence from tobacco and alcohol strongly advised 2) outpatient alcohol rehab programs advised--- please read information given to you 3) please take your blood pressure and seizure medications as prescribed 4) outpatient physical therapy advised to help you get stronger and reduce your risk of falling  Admission Diagnosis  Syncope and collapse [R55] Hypomagnesemia [E83.42] Alcohol abuse [F10.10] Syncope [R55] Primary hypertension [I10] Non compliance w medication regimen [Z91.14] Syncope, unspecified syncope type [R55]   Discharge Diagnosis  Syncope and collapse [R55] Hypomagnesemia [E83.42] Alcohol abuse [F10.10] Syncope [R55] Primary hypertension [I10] Non compliance w medication regimen [Z91.14] Syncope, unspecified syncope type [R55]    Principal Problem:   Syncope Active Problems:   Alcohol abuse   COPD/Emphysema of lung (HCC)   HTN, goal below 140/90   Adult failure to thrive   Tobacco abuse      Past Medical History:  Diagnosis Date   ETOH abuse    Hypertension    Pulmonary nodule    Tobacco abuse     Past Surgical History:  Procedure Laterality Date   ANKLE FRACTURE SURGERY     APPENDECTOMY     BIOPSY  01/08/2019   Procedure: BIOPSY;  Surgeon: Danie Binder, MD;  Location: [facility];  Service: Endoscopy;;  gastric   COLONOSCOPY  01/2015   Dr. Posey Pronto: two 2-13mm rectal polyps removed, hyperplastic   COLONOSCOPY  2007   Dr. Posey Pronto: three adenomas removed measuring 4, 10, 35mm. diverticulosis   COLONOSCOPY WITH PROPOFOL N/A 01/08/2019   Dr. Oneida Alar: Severe diverticulosis with fixed  rectosigmoid region, rectal bleeding likely due to internal hemorrhoids.  Recommended repeat colonoscopy at [facility] because examination was incomplete due to fixed rectosigmoid colon (patient never followed through).   ESOPHAGOGASTRODUODENOSCOPY (EGD) WITH PROPOFOL N/A 01/08/2019   Dr. Oneida Alar: Low-grade narrowing Schatzki ring at the GE junction, medium sized hiatal hernia, erosive gastritis/duodenitis in the setting of NSAID and alcohol use.  Gastric biopsy showed gastropathy but no H. pylori   intestininal blockage  1970       HPI  from the history and physical done on the day of admission:     Scott Jackson  is a 70 y.o. male with past medical history relevant for CAD, COPD, ongoing tobacco and alcohol abuse, history of recurrent falls/syncope more frequent lately in the setting of heavy EtOH use presented to the ED with intractable emesis and episodes of passing out at home -Denies chest pains palpitations -Emesis is without bile or blood -Diarrhea, no pleuritic symptoms -Additional history obtained from patient's daughter Scott Jackson at bedside today is concerned about patient's alcohol use and episodes of syncope -No concerns about seizures or loss of consciousness -No fever  Or chills -In the  ED mag is low, lipase WNL, ammonia is not elevated, B12 WNL LFTs WNL, Creatinine 0.98 BAL <10, Hgb 12.6 CT head Neg -Covid Test pending    Hospital Course:     1)Syncope-recurrent syncope, not new but more frequent lately  On telemetry monitored unit no significant arrhythmias, -Patient ruled out for ACS by EKG and serial cardiac enzymes -echocardiogram w/o significant aortic stenosis or other outflow obstruction, and  EF is preserved at 60 to 65% w/o segmental/Regional wall motion abnormalities. - carotid artery Dopplers w/o hemodynamically significant stenosis -Echo from March 2021 with preserved EF of 50 to 55% -Patient had a 14-day Holter monitor back in March 2021 without significant  arrhythmia  TSH WNL --No concerns about seizures or loss of consciousness--- already on Keppra -CT head negative   2)Recurrent falls----multifactorial suspect alcohol is playing a significant role -Unfortunately patient is not ready to quit drinking -Physical therapy eval appreciated recommends outpatient PT   3)Alcohol Abuse--as per patient's daughter patient drinks at least a bottle of vodka daily --- recommend thiamine and folic acid as ordered -This is probably contributing to patient's falls/syncope -Outpatient alcohol rehab advised   4)COPD/Emphysema/Tobacco Abuse--- smoking cessation advised May use bronchodilators no acute exacerbation at this time  5)HTN--stage II, uncontrolled and the patient was noncompliant -Resume antihypertensives compliance advised    6)CAD incidental finding on CT and neg ETT in 2019.  Echo as noted above #1   7)Hypomagnesemia in an alcoholic male-----replaced    8) intractable emesis--- in an alcoholic male, lipase WNL, -Eating and drinking well no further emesis   9) normocytic and normochromic anemia--???  Etiology, -Anemia work-up reveals normal folate and B12 , iron studies WNL -Improve oral intake/nutrition and abstinence from alcohol advised   Disposition--- Home with family    Discharge Condition: Stable  Follow UP   Follow-up Information     Caren Macadam, MD. Schedule an appointment as soon as possible for a visit in 1 week(s).   Specialty: Family Medicine Contact information: [place] [address] [phone]         Josue Hector, MD .   Specialty: Cardiology Contact information: [phone] [address] [place] [address] [phone]                  [place] and Activity recommendation:  As advised  Discharge Instructions    Discharge Instructions     Ambulatory referral to Physical Therapy   Complete by: As directed    Unsteady gait and  recurrent falls   Call MD for:  difficulty breathing, headache or visual disturbances   Complete by: As directed    Call MD for:  persistant dizziness or light-headedness   Complete by: As directed    Call MD for:  persistant nausea and vomiting   Complete by: As directed    Call MD for:  temperature >100.4   Complete by: As directed    Diet - low sodium heart healthy   Complete by: As directed    Discharge instructions   Complete by: As directed    1) complete abstinence from tobacco and alcohol strongly advised 2) outpatient alcohol rehab programs advised--- please read information given to you 3) please take your blood pressure and seizure medications as prescribed 4) outpatient physical therapy advised to help you get stronger and reduce your risk of falling   Increase activity slowly   Complete by: As directed  Discharge Medications     Allergies as of 01/10/2021   No Known Allergies      Medication List     STOP taking these medications    predniSONE 10 MG (21) Tbpk tablet Commonly known as: STERAPRED UNI-PAK 21 TAB       TAKE these medications    acetaminophen 325 MG tablet Commonly known as: TYLENOL Take 2 tablets (650 mg total) by mouth every 6 (six) hours as needed for mild pain (or Fever >/= 101).   albuterol 108 (90 Base) MCG/ACT inhaler Commonly known as: VENTOLIN HFA Inhale 2 puffs into the lungs every 4 (four) hours as needed for wheezing or shortness of breath.   amLODipine 10 MG tablet Commonly known as: NORVASC Take 1 tablet (10 mg total) by mouth daily.   fluticasone furoate-vilanterol 200-25 MCG/INH Aepb Commonly known as: BREO ELLIPTA Inhale 1 puff into the lungs daily. Start taking on: January 12, 931   folic acid 1 MG tablet Commonly known as: FOLVITE Take 1 tablet (1 mg total) by mouth daily. Start taking on: January 11, 2021   gabapentin 300 MG capsule Commonly known as: NEURONTIN Take 1 capsule (300 mg total) by mouth 3  (three) times daily. What changed:  medication strength how much to take how to take this when to take this additional instructions   hydrALAZINE 50 MG tablet Commonly known as: APRESOLINE Take 1 tablet (50 mg total) by mouth 3 (three) times daily.   isosorbide mononitrate 30 MG 24 hr tablet Commonly known as: IMDUR Take 1 tablet (30 mg total) by mouth daily.   lactulose 10 GM/15ML solution Commonly known as: CHRONULAC Take 30 mLs (20 g total) by mouth daily. To have 2-3 soft stools daily to keep ammonia down and stay mentally clear. What changed: when to take this   levETIRAcetam 500 MG tablet Commonly known as: KEPPRA Take 1 tablet (500 mg total) by mouth 2 (two) times daily.   lisinopril-hydrochlorothiazide 20-12.5 MG tablet Commonly known as: ZESTORETIC Take 1 tablet by mouth every morning. What changed: how much to take   multivitamin with minerals Tabs tablet Take 1 tablet by mouth daily. Start taking on: January 11, 2021   nicotine 21 mg/24hr patch Commonly known as: NICODERM CQ - dosed in mg/24 hours Place 1 patch (21 mg total) onto the skin daily. Start taking on: January 11, 2021   pantoprazole 40 MG tablet Commonly known as: PROTONIX Take 1 tablet (40 mg total) by mouth daily. Start taking on: January 11, 2021   PARoxetine 40 MG tablet Commonly known as: PAXIL Take 1 tablet (40 mg total) by mouth every morning.   tamsulosin 0.4 MG Caps capsule Commonly known as: FLOMAX Take 1 capsule (0.4 mg total) by mouth daily.   thiamine 100 MG tablet Take 1 tablet (100 mg total) by mouth daily. Start taking on: January 11, 2021        Major procedures and Radiology Reports - PLEASE review detailed and final reports for all details, in brief -   DG Chest 2 View  Result Date: 01/10/2021 CLINICAL DATA:  Recurrent syncopal episodes. EXAM: CHEST - 2 VIEW COMPARISON:  None. FINDINGS: The heart size and mediastinal contours are within normal limits. Both lungs are clear.  Incidentally noted are nipple shadows overlying the the lower lungs. The visualized skeletal structures are unremarkable. IMPRESSION: No active cardiopulmonary disease. Electronically Signed   By: Marlaine Hind M.D.   On: 01/10/2021 10:35   CT Head Wo  Contrast  Result Date: 01/09/2021 CLINICAL DATA:  Dizziness abnormal gait EXAM: CT HEAD WITHOUT CONTRAST TECHNIQUE: Contiguous axial images were obtained from the base of the skull through the vertex without intravenous contrast. COMPARISON:  10/26/2019 FINDINGS: Brain: Generalized atrophy unchanged. Negative for hydrocephalus. Mild white matter hypodensity bilaterally appears unchanged Negative for acute infarct, hemorrhage, mass. Vascular: Negative for hyperdense vessel Skull: Negative Sinuses/Orbits: Negative Other: None IMPRESSION: No acute abnormality. Generalized atrophy with mild microvascular ischemic changes in the white matter. Electronically Signed   By: Franchot Gallo M.D.   On: 01/09/2021 11:48   US Carotid Bilateral  Result Date: 01/10/2021 CLINICAL DATA:  Syncope, dizziness, hypertension, history of tobacco abuse EXAM: BILATERAL CAROTID DUPLEX ULTRASOUND TECHNIQUE: Pearline Cables scale imaging, color Doppler and duplex ultrasound were performed of bilateral carotid and vertebral arteries in the neck. COMPARISON:  None. FINDINGS: Criteria: Quantification of carotid stenosis is based on velocity parameters that correlate the residual internal carotid diameter with NASCET-based stenosis levels, using the diameter of the distal internal carotid lumen as the denominator for stenosis measurement. The following velocity measurements were obtained: RIGHT ICA: 44/11 cm/sec CCA: 70/62 cm/sec SYSTOLIC ICA/CCA RATIO:  0.5 ECA: 75 cm/sec LEFT ICA: 44/13 cm/sec CCA: 37/62 cm/sec SYSTOLIC ICA/CCA RATIO:  0.7 ECA: 86 cm/sec RIGHT CAROTID ARTERY: Mild smooth noncalcified plaque in the bulb and proximal ICA with only mild stenosis. Normal waveforms and color Doppler signal  throughout. Mild ICA tortuosity distally. RIGHT VERTEBRAL ARTERY:  Normal flow direction and waveform. LEFT CAROTID ARTERY: Somewhat irregular intimal thickening in the common carotid artery. Smooth eccentric mild plaque in the bulb without significant stenosis. Normal waveforms and color Doppler signal throughout. LEFT VERTEBRAL ARTERY:  Normal flow direction and waveform. IMPRESSION: 1. Bilateral carotid bifurcation plaque resulting in less than 50% diameter ICA stenosis. 2. Antegrade bilateral vertebral arterial flow. Electronically Signed   By: Lucrezia Europe M.D.   On: 01/10/2021 11:30   ECHOCARDIOGRAM COMPLETE  Result Date: 01/10/2021    ECHOCARDIOGRAM REPORT   Patient Name:   Scott Jackson Date of Exam: 01/10/2021 Medical Rec #:  [id number]     Height:       73.0 in Accession #:    [id number]    Weight:       125.0 lb Date of Birth:  Dec 28, 1950    BSA:          1.762 m Patient Age:    7 years      BP:           128/78 mmHg Patient Gender: M             HR:           68 bpm. Exam Location:  [facility] Procedure: 2D Echo, Cardiac Doppler and Color Doppler Indications:    R55 Syncope  History:        Patient has prior history of Echocardiogram examinations, most                 recent 10/22/2019. Signs/Symptoms:Syncope; Risk Factors:ETOH,                 Hypertension, Current Smoker and Diabetes.  Sonographer:    Bernadene Person RDCS Referring Phys: [place]  1. Left ventricular ejection fraction, by estimation, is 60 to 65%. The left ventricle has normal function. The left ventricle has no regional wall motion abnormalities. There is mild left ventricular hypertrophy. Left ventricular diastolic parameters are indeterminate.  2. Right ventricular systolic function  is normal. The right ventricular size is normal. There is normal pulmonary artery systolic pressure.  3. The mitral valve is normal in structure. No evidence of mitral valve regurgitation. No evidence of mitral stenosis.  4. The  aortic valve is tricuspid. Aortic valve regurgitation is not visualized. No aortic stenosis is present.  5. IVC appears small, suggesting low RA pressure and hypovolemia. FINDINGS  Left Ventricle: Left ventricular ejection fraction, by estimation, is 60 to 65%. The left ventricle has normal function. The left ventricle has no regional wall motion abnormalities. The left ventricular internal cavity size was normal in size. There is  mild left ventricular hypertrophy. Left ventricular diastolic parameters are indeterminate. Right Ventricle: The right ventricular size is normal. No increase in right ventricular wall thickness. Right ventricular systolic function is normal. There is normal pulmonary artery systolic pressure. The tricuspid regurgitant velocity is 2.54 m/s, and  with an assumed right atrial pressure of 3 mmHg, the estimated right ventricular systolic pressure is 15.1 mmHg. Left Atrium: Left atrial size was normal in size. Right Atrium: Right atrial size was normal in size. Pericardium: There is no evidence of pericardial effusion. Mitral Valve: The mitral valve is normal in structure. No evidence of mitral valve regurgitation. No evidence of mitral valve stenosis. Tricuspid Valve: The tricuspid valve is normal in structure. Tricuspid valve regurgitation is trivial. No evidence of tricuspid stenosis. Aortic Valve: The aortic valve is tricuspid. Aortic valve regurgitation is not visualized. No aortic stenosis is present. Aortic valve mean gradient measures 1.5 mmHg. Aortic valve peak gradient measures 3.4 mmHg. Aortic valve area, by VTI measures 2.97 cm. Pulmonic Valve: The pulmonic valve was not well visualized. Pulmonic valve regurgitation is not visualized. No evidence of pulmonic stenosis. Aorta: The aortic root is normal in size and structure. Pulmonary Artery: Indeterminant PASP, inadequate TR jet. Venous: IVC appears small, suggesting low RA pressure and hypovolemia. IAS/Shunts: No atrial level  shunt detected by color flow Doppler.  LEFT VENTRICLE PLAX 2D LV PW:         1.20 cm  Diastology LV IVS:        1.20 cm  LV e' medial:    7.71 cm/s LVOT diam:     2.00 cm  LV E/e' medial:  10.1 LV SV:         48       LV e' lateral:   9.11 cm/s LV SV Index:   27       LV E/e' lateral: 8.5 LVOT Area:     3.14 cm  RIGHT VENTRICLE RV S prime:     12.10 cm/s TAPSE (M-mode): 2.0 cm LEFT ATRIUM             Index       RIGHT ATRIUM           Index LA Vol (A2C):   20.5 ml 11.63 ml/m RA Area:     14.10 cm LA Vol (A4C):   21.4 ml 12.14 ml/m RA Volume:   35.40 ml  20.09 ml/m LA Biplane Vol: 22.6 ml 12.83 ml/m  AORTIC VALVE AV Area (Vmax):    2.63 cm AV Area (Vmean):   2.85 cm AV Area (VTI):     2.97 cm AV Vmax:           91.65 cm/s AV Vmean:          55.739 cm/s AV VTI:            0.163 m AV Peak  Grad:      3.4 mmHg AV Mean Grad:      1.5 mmHg LVOT Vmax:         76.60 cm/s LVOT Vmean:        50.500 cm/s LVOT VTI:          0.154 m LVOT/AV VTI ratio: 0.95  AORTA Ao Root diam: 3.60 cm Ao Asc diam:  3.30 cm MITRAL VALVE               TRICUSPID VALVE MV Area (PHT): 3.13 cm    TR Peak grad:   25.8 mmHg MV Decel Time: 242 msec    TR Vmax:        254.00 cm/s MV E velocity: 77.60 cm/s MV A velocity: 70.70 cm/s  SHUNTS MV E/A ratio:  1.10        Systemic VTI:  0.15 m                            Systemic Diam: 2.00 cm Carlyle Dolly MD Electronically signed by Carlyle Dolly MD Signature Date/Time: 01/10/2021/3:52:53 PM    Final     Micro Results   Recent Results (from the past 240 hour(s))  Resp Panel by RT-PCR (Flu A&B, Covid) Nasopharyngeal Swab     Status: None   Collection Time: 01/09/21  6:02 PM   Specimen: Nasopharyngeal Swab; Nasopharyngeal(NP) swabs in vial transport medium  Result Value Ref Range Status   SARS Coronavirus 2 by RT PCR NEGATIVE NEGATIVE Final    Comment: (NOTE) SARS-CoV-2 target nucleic acids are NOT DETECTED.  The SARS-CoV-2 RNA is generally detectable in upper respiratory specimens  during the acute phase of infection. The lowest concentration of SARS-CoV-2 viral copies this assay can detect is 138 copies/mL. A negative result does not preclude SARS-Cov-2 infection and should not be used as the sole basis for treatment or other patient management decisions. A negative result may occur with  improper specimen collection/handling, submission of specimen other than nasopharyngeal swab, presence of viral mutation(s) within the areas targeted by this assay, and inadequate number of viral copies(<138 copies/mL). A negative result must be combined with clinical observations, patient history, and epidemiological information. The expected result is Negative.  Fact Sheet for Patients:  EntrepreneurPulse.com.au  Fact Sheet for Healthcare Providers:  IncredibleEmployment.be  This test is no t yet approved or cleared by the [place] FDA and  has been authorized for detection and/or diagnosis of SARS-CoV-2 by FDA under an Emergency Use Authorization (EUA). This EUA will remain  in effect (meaning this test can be used) for the duration of the COVID-19 declaration under Section 564(b)(1) of the Act, 21 U.S.C.section 360bbb-3(b)(1), unless the authorization is terminated  or revoked sooner.       Influenza A by PCR NEGATIVE NEGATIVE Final   Influenza B by PCR NEGATIVE NEGATIVE Final    Comment: (NOTE) The Xpert Xpress SARS-CoV-2/FLU/RSV plus assay is intended as an aid in the diagnosis of influenza from Nasopharyngeal swab specimens and should not be used as a sole basis for treatment. Nasal washings and aspirates are unacceptable for Xpert Xpress SARS-CoV-2/FLU/RSV testing.  Fact Sheet for Patients: EntrepreneurPulse.com.au  Fact Sheet for Healthcare Providers: IncredibleEmployment.be  This test is not yet approved or cleared by the [place] FDA and has been authorized for detection  and/or diagnosis of SARS-CoV-2 by FDA under an Emergency Use Authorization (EUA). This EUA will remain in effect (meaning this test  can be used) for the duration of the COVID-19 declaration under Section 564(b)(1) of the Act, 21 U.S.C. section 360bbb-3(b)(1), unless the authorization is terminated or revoked.  Performed at [facility], [address] [place], [place] [address]        Today   Subjective    Scott Jackson today has no new complaints  No fever  Or chills   No Nausea, Vomiting or Diarrhea         Patient has been seen and examined prior to discharge   Objective   Blood pressure 117/84, pulse 75, temperature 97.9 F (36.6 C), resp. rate 16, height 6\' 1"  (1.854 m), weight 56.7 kg, SpO2 100 %.   Intake/Output Summary (Last 24 hours) at 01/10/2021 1757 Last data filed at 01/10/2021 1000 Gross per 24 hour  Intake 120 ml  Output 200 ml  Net -80 ml    Exam Gen:- Awake Alert, no acute distress  HEENT:- [place].AT, No sclera icterus Neck-Supple Neck,No JVD,.  Lungs-  CTAB , good air movement bilaterally  CV- S1, S2 normal, regular Abd-  +ve B.Sounds, Abd Soft, No tenderness,    Extremity/Skin:- No  edema,   good pulses Psych-affect is appropriate, oriented x3 Neuro-no new focal deficits, no tremors    Data Review   CBC w Diff:  Lab Results  Component Value Date   WBC 3.8 (L) 01/10/2021   HGB 10.5 (L) 01/10/2021   HCT 31.6 (L) 01/10/2021   PLT 206 01/10/2021   LYMPHOPCT 33 01/09/2021   MONOPCT 15 01/09/2021   EOSPCT 0 01/09/2021   BASOPCT 0 01/09/2021    CMP:  Lab Results  Component Value Date   NA 134 (L) 01/10/2021   K 3.4 (L) 01/10/2021   CL 96 (L) 01/10/2021   CO2 27 01/10/2021   BUN 16 01/10/2021   CREATININE 0.92 01/10/2021   CREATININE 0.89 10/16/2019   PROT 7.5 01/09/2021   ALBUMIN 4.3 01/09/2021   BILITOT 0.8 01/09/2021   ALKPHOS 37 (L) 01/09/2021   AST 22 01/09/2021   ALT 12 01/09/2021  .   Total Discharge time is about 33  minutes  Roxan Hockey M.D on 01/10/2021 at 5:57 PM  Go to [email or web address] -  for contact info  [facility] - Office  [phone]

## 2021-01-21 DIAGNOSIS — R69 Illness, unspecified: Secondary | ICD-10-CM | POA: Diagnosis not present

## 2021-01-21 DIAGNOSIS — G8929 Other chronic pain: Secondary | ICD-10-CM | POA: Diagnosis not present

## 2021-01-21 DIAGNOSIS — E785 Hyperlipidemia, unspecified: Secondary | ICD-10-CM | POA: Diagnosis not present

## 2021-01-21 DIAGNOSIS — J439 Emphysema, unspecified: Secondary | ICD-10-CM | POA: Diagnosis not present

## 2021-01-21 DIAGNOSIS — I10 Essential (primary) hypertension: Secondary | ICD-10-CM | POA: Diagnosis not present

## 2021-01-22 DIAGNOSIS — J439 Emphysema, unspecified: Secondary | ICD-10-CM | POA: Diagnosis not present

## 2021-01-22 DIAGNOSIS — I10 Essential (primary) hypertension: Secondary | ICD-10-CM | POA: Diagnosis not present

## 2021-01-22 DIAGNOSIS — G8929 Other chronic pain: Secondary | ICD-10-CM | POA: Diagnosis not present

## 2021-01-22 DIAGNOSIS — R69 Illness, unspecified: Secondary | ICD-10-CM | POA: Diagnosis not present

## 2021-01-22 DIAGNOSIS — E785 Hyperlipidemia, unspecified: Secondary | ICD-10-CM | POA: Diagnosis not present

## 2021-02-02 ENCOUNTER — Ambulatory Visit (HOSPITAL_COMMUNITY): Payer: Medicare HMO | Admitting: Physical Therapy

## 2021-02-12 DIAGNOSIS — Z743 Need for continuous supervision: Secondary | ICD-10-CM | POA: Diagnosis not present

## 2021-02-12 DIAGNOSIS — M25511 Pain in right shoulder: Secondary | ICD-10-CM | POA: Diagnosis not present

## 2021-02-12 DIAGNOSIS — R112 Nausea with vomiting, unspecified: Secondary | ICD-10-CM | POA: Diagnosis not present

## 2021-02-12 DIAGNOSIS — R42 Dizziness and giddiness: Secondary | ICD-10-CM | POA: Diagnosis not present

## 2021-02-12 DIAGNOSIS — M25512 Pain in left shoulder: Secondary | ICD-10-CM | POA: Diagnosis not present

## 2021-02-12 DIAGNOSIS — R110 Nausea: Secondary | ICD-10-CM | POA: Diagnosis not present

## 2021-02-12 DIAGNOSIS — Z5181 Encounter for therapeutic drug level monitoring: Secondary | ICD-10-CM | POA: Diagnosis not present

## 2021-02-12 DIAGNOSIS — F17210 Nicotine dependence, cigarettes, uncomplicated: Secondary | ICD-10-CM | POA: Diagnosis not present

## 2021-02-12 DIAGNOSIS — R4182 Altered mental status, unspecified: Secondary | ICD-10-CM | POA: Diagnosis not present

## 2021-02-12 DIAGNOSIS — I10 Essential (primary) hypertension: Secondary | ICD-10-CM | POA: Diagnosis not present

## 2021-02-12 DIAGNOSIS — R69 Illness, unspecified: Secondary | ICD-10-CM | POA: Diagnosis not present

## 2021-02-16 ENCOUNTER — Ambulatory Visit (HOSPITAL_COMMUNITY): Payer: Medicare HMO | Attending: Family Medicine | Admitting: Physical Therapy

## 2021-02-19 DIAGNOSIS — D649 Anemia, unspecified: Secondary | ICD-10-CM | POA: Diagnosis not present

## 2021-02-19 DIAGNOSIS — R69 Illness, unspecified: Secondary | ICD-10-CM | POA: Diagnosis not present

## 2021-02-19 DIAGNOSIS — I700 Atherosclerosis of aorta: Secondary | ICD-10-CM | POA: Diagnosis not present

## 2021-02-19 DIAGNOSIS — G40909 Epilepsy, unspecified, not intractable, without status epilepticus: Secondary | ICD-10-CM | POA: Diagnosis not present

## 2021-02-19 DIAGNOSIS — R296 Repeated falls: Secondary | ICD-10-CM | POA: Diagnosis not present

## 2021-02-19 DIAGNOSIS — M25561 Pain in right knee: Secondary | ICD-10-CM | POA: Diagnosis not present

## 2021-02-19 DIAGNOSIS — I10 Essential (primary) hypertension: Secondary | ICD-10-CM | POA: Diagnosis not present

## 2021-02-22 ENCOUNTER — Ambulatory Visit: Payer: Medicare HMO | Admitting: Gastroenterology

## 2021-02-24 ENCOUNTER — Ambulatory Visit: Payer: Medicare HMO | Admitting: Neurology

## 2021-02-24 ENCOUNTER — Encounter: Payer: Self-pay | Admitting: Neurology

## 2021-03-09 ENCOUNTER — Other Ambulatory Visit: Payer: Self-pay

## 2021-03-09 ENCOUNTER — Ambulatory Visit (HOSPITAL_COMMUNITY): Payer: Medicare HMO | Attending: Family Medicine | Admitting: Physical Therapy

## 2021-03-09 DIAGNOSIS — Z9181 History of falling: Secondary | ICD-10-CM

## 2021-03-09 NOTE — Therapy (Signed)
[place], [place], [address] Phone: [phone]   Fax:  [phone]  Physical Therapy Evaluation  Patient Details  Name: Scott Jackson MRN: [id number] Date of Birth: [birth year] Referring Provider (PT): Scott Jackson   Encounter Date: 03/09/2021   PT End of Session - 03/09/21 1034     Visit Number 1    Number of Visits 8    Date for PT Re-Evaluation 04/08/21    Authorization Type aetna medicare    PT Start Time 1005    PT Stop Time 1040    PT Time Calculation (min) 35 min    Activity Tolerance Patient tolerated treatment well    Behavior During Therapy [facility] for tasks assessed/performed             Past Medical History:  Diagnosis Date   ETOH abuse    Hypertension    Pulmonary nodule    Tobacco abuse     Past Surgical History:  Procedure Laterality Date   ANKLE FRACTURE SURGERY     APPENDECTOMY     BIOPSY  01/08/2019   Procedure: BIOPSY;  Surgeon: Scott Binder, MD;  [facility];  Service: Endoscopy;;  gastric   COLONOSCOPY  01/2015   Scott Jackson: two 2-17m rectal polyps removed, hyperplastic   COLONOSCOPY  2007   Dr. PPosey Jackson three adenomas removed measuring 4, 10, 124m diverticulosis   COLONOSCOPY WITH PROPOFOL N/A 01/08/2019   Dr. FiOneida Jackson diverticulosis with fixed rectosigmoid region, rectal bleeding likely due to internal hemorrhoids.  [facility] colon (patient never followed through).   ESOPHAGOGASTRODUODENOSCOPY (EGD) WITH PROPOFOL N/A 01/08/2019   Dr. FiOneida AlarLow-grade narrowing Schatzki ring at the GE junction, medium sized hiatal hernia, erosive gastritis/duodenitis in the setting of NSAID and alcohol use.  Gastric biopsy showed gastropathy but no H. pylori   intestininal blockage  1970    There were no vitals filed for this visit.    Subjective Assessment - 03/09/21 1007      Subjective [name] admitted into the hospital from 6/8-6/9 with dx of alcohol intoxication, sycope and collapse.  He has been referred to this clinic for weakness.  Scott Jackson that he gets off balance and has been falling.  He states that he has fallen over 10 times.  He states that he can be lying in bed and get swimmy headed.  He states that he has not decreased his alcohol consumption. He is trying to cut down on his tobacco use.    Pertinent History alcohol abuse, seizure activity, HTN, back pain    Patient Stated Goals not to be falling    Currently in Pain? No/denies                [facility]       Assessment   Medical Diagnosis weakness    Referring Provider (PT) RaCaren Jackson  Onset Date/Surgical Date 12/23/20    Next MD Visit not sure pt daughter takes care of this    Prior Therapy none      Precautions   Precautions Fall      Balance Screen   Has the patient fallen in the past 6 months Yes    How many times? 10 or more    Has the patient had a decrease in activity level because of a fear of  falling?  Yes    Is the patient reluctant to leave their home because of a fear of falling?  Yes      [place] residence      Prior Function   Level of Independence Independent      Cognition   Overall Cognitive Status Within Functional Limits for tasks assessed      Functional Tests   Functional tests Single leg stance;Sit to Stand      Single Leg Stance   Comments Rt: 1 second; Lt 0      Sit to Stand   Comments 8 in 30 seconds   poor for pt aid.     ROM / Strength   AROM / PROM / Strength Strength      Strength   Strength Assessment Site Hip;Knee;Ankle    Right/Left Hip Right;Left    Right Hip Flexion 5/5    Right Hip Extension 3-/5    Right Hip ABduction 4+/5    Left Hip Flexion 4/5    Left Hip Extension 3-/5    Left Hip ABduction 5/5    Right/Left Knee Right;Left    Right  Knee Flexion 4/5    Right Knee Extension 5/5    Left Knee Flexion 4/5    Left Knee Extension 5/5    Right/Left Ankle Right;Left    Right Ankle Dorsiflexion 5/5    Left Ankle Dorsiflexion 5/5                    Vestibular Assessment - 03/09/21 0001       Positional Testing   Dix-Hallpike Dix-Hallpike Right;Dix-Hallpike Left      Dix-Hallpike Right   Dix-Hallpike Right Symptoms No nystagmus      Dix-Hallpike Left   Dix-Hallpike Left Symptoms No nystagmus                Objective measurements completed on examination: See above findings.       [place] Adult PT Treatment/Exercise - 03/09/21 0001       Exercises   Exercises Knee/Hip      Knee/Hip Exercises: Standing   SLS x3 B      Knee/Hip Exercises: Seated   Other Seated Knee/Hip Exercises scapular retraction    Sit to Sand 10 reps      Knee/Hip Exercises: Supine   Bridges 10 reps                      PT Short Term Goals - 03/09/21 1038       PT SHORT TERM GOAL #1   Title PT to be I in HEP  to be able to walk for 10 minutes without having to rest.    Time 2    Period Weeks    Status New    Target Date 03/23/21      PT SHORT TERM GOAL #2   Title PT to be able to single leg stance  on both LE for at least 10 seconds to decrease risk of falling    Time 2    Period Weeks               PT Long Term Goals - 03/09/21 1040       PT LONG TERM GOAL #1   Title PT to be I in advance HEP to improve gluteal strength by one grade to be able to be in average (14 sit to stand in 30  seconds ) for age.    Time 4    Period Weeks    Target Date 04/06/21      PT LONG TERM GOAL #2   Title PT to be able to single leg stnd for 15 seconds on both LE to reduce risk of falling    Time 4    Period Weeks    Status New                    Plan - 03/09/21 1034     Clinical Impression Statement Scott Jackson is a 70 yo male who has been referred to skilled therapy for weakness.   Evaluation demonstrates decreased balance and decreased gluteal maximus and core strength.  Scott Jackson will benefit from skilled PT to work on core strength and balance to decrease his risk of falling.    Personal Factors and Comorbidities Comorbidity 2    Comorbidities alcoholism, chronic back pain    Examination-Activity Limitations Locomotion Level;Stand;Carry    Examination-Participation Restrictions Cleaning;Community Activity;Shop;Yard Work    Merchant [employer] officer Stable/Uncomplicated    Designer, jewellery Low    Rehab Potential Fair    PT Frequency 2x / week    PT Duration 4 weeks    PT Treatment/Interventions Functional mobility training;Therapeutic activities;Therapeutic exercise;Balance training    PT Next Visit Plan work on strengthening core, tband exercises.  and balance    PT Home Exercise Plan eval : sit to stand, bridge, scapular retraction and cervical sidebend.             Patient will benefit from skilled therapeutic intervention in order to improve the following deficits and impairments:  Abnormal gait, Pain, Decreased strength, Difficulty walking, Decreased balance  Visit Diagnosis: History of falling     Problem List Patient Active Problem List   Diagnosis Date Noted   Syncope 01/09/2021   Tobacco abuse 01/09/2021   Alcohol dependence with other alcohol-induced disorder ([place]) 05/07/2020   Seizure disorder ([place]) 05/07/2020   Anorexia nervosa 10/16/2019   Loss of weight 05/13/2019   Abnormal CT scan, colon 05/13/2019   Single seizure ([place]) 04/09/2019   Adult failure to thrive 04/09/2019   Alcoholism with alcohol dependence ([place]) 03/19/2019   Alcoholism associated with dementia ([place]) 03/19/2019   COPD/Emphysema of lung ([place]) 03/19/2019   Abdominal pain, epigastric    Hemorrhoid 09/28/2018   Rectal bleeding 09/28/2018   Constipation 09/28/2018   Screen for sexually transmitted diseases 11/24/2017   Hyperlipidemia 11/24/2017   HTN,  goal below 140/90 11/24/2017   Screening for viral disease 11/24/2017   Alcohol abuse 11/24/2017   Alcohol abuse counseling and surveillance of alcoholic [id number]   Rayetta Humphrey, PT CLT [phone]  03/09/2021, 10:43 AM  [place], [place], [address] Phone: [phone]   Fax:  [phone]  Name: Scott Jackson MRN: [id number] Date of Birth: [birth year]

## 2021-03-12 ENCOUNTER — Ambulatory Visit (HOSPITAL_COMMUNITY): Payer: Medicare HMO | Admitting: Physical Therapy

## 2021-03-16 ENCOUNTER — Encounter (HOSPITAL_COMMUNITY): Payer: Medicare HMO

## 2021-03-23 ENCOUNTER — Encounter (HOSPITAL_COMMUNITY): Payer: Medicare HMO | Admitting: Physical Therapy

## 2021-03-23 DIAGNOSIS — I10 Essential (primary) hypertension: Secondary | ICD-10-CM | POA: Diagnosis not present

## 2021-03-23 DIAGNOSIS — E785 Hyperlipidemia, unspecified: Secondary | ICD-10-CM | POA: Diagnosis not present

## 2021-03-23 DIAGNOSIS — J439 Emphysema, unspecified: Secondary | ICD-10-CM | POA: Diagnosis not present

## 2021-03-23 DIAGNOSIS — R69 Illness, unspecified: Secondary | ICD-10-CM | POA: Diagnosis not present

## 2021-03-23 DIAGNOSIS — G8929 Other chronic pain: Secondary | ICD-10-CM | POA: Diagnosis not present

## 2021-03-31 ENCOUNTER — Encounter (HOSPITAL_COMMUNITY): Payer: Medicare HMO | Admitting: Physical Therapy

## 2021-04-01 ENCOUNTER — Encounter (HOSPITAL_COMMUNITY): Payer: Medicare HMO | Admitting: Physical Therapy

## 2021-04-06 ENCOUNTER — Encounter (HOSPITAL_COMMUNITY): Payer: Medicare HMO | Admitting: Physical Therapy

## 2021-04-08 ENCOUNTER — Encounter (HOSPITAL_COMMUNITY): Payer: Medicare HMO | Admitting: Physical Therapy

## 2021-04-16 DIAGNOSIS — R69 Illness, unspecified: Secondary | ICD-10-CM | POA: Diagnosis not present

## 2021-04-16 DIAGNOSIS — E785 Hyperlipidemia, unspecified: Secondary | ICD-10-CM | POA: Diagnosis not present

## 2021-04-16 DIAGNOSIS — I10 Essential (primary) hypertension: Secondary | ICD-10-CM | POA: Diagnosis not present

## 2021-04-16 DIAGNOSIS — G8929 Other chronic pain: Secondary | ICD-10-CM | POA: Diagnosis not present

## 2021-04-16 DIAGNOSIS — J439 Emphysema, unspecified: Secondary | ICD-10-CM | POA: Diagnosis not present

## 2021-05-19 NOTE — Progress Notes (Signed)
Referring Provider: Caren Macadam, MD Primary Care Physician:  Caren Macadam, MD Primary GI Physician: Dr. Abbey Chatters  Chief Complaint  Patient presents with   Nausea    With some vomiting    HPI:   Scott Jackson is a 70 y.o. male presenting today with a history of gastritis in the setting of NSAIDs and alcohol with recommendations for PPI indefinitely.  Previously attempted colonoscopy due to hematochezia in June 2020 which revealed severe diverticulosis with fixed rectosigmoid colon, internal hemorrhoids suspected to be source of rectal bleeding. Due to incomplete exam, recommended colonoscopy at [facility] with x-ray.  Last seen in our office 10/16/2019 for abnormal CT scan:, Failure to thrive, anorexia nervosa, loss of weight, alcohol abuse.  He presented with his daughter Scott Jackson.  She was concerned about his overall decline over the last year since he stopped working.  Continue to drink alcohol on a regular basis and not eating well.  Has no motivation.  Weight down 7 pounds since 12/2018.  Never had colonoscopy scheduled at [facility]. Did not want to go all the way to [facility] for procedure.  Prior CT in June 2020 due to incomplete colonoscopy showed scattered colonic diverticulosis, possible mild wall thickening of the cecum and ascending colon, possibly due to limited distention.  He denied BRBPR, melena, abdominal pain.  BMs okay.  Rare heartburn.  Noted some confusion and memory issues.  Drinking alcohol rather than eating.  Was undergoing evaluation by cardiology and neurology for syncope.  Plan to update labs, CT A/P with contrast, further recommendations to follow.  Labs revealed mildly elevated ammonia at 74, white blood cell count 3.1 (L), hemoglobin 11.8 (L) with microcytic indices, platelets 311, AST elevated at 125, other LFTs, kidney function, and electrolytes within normal limits.  INR 1.0.  Lipase 11.    Recommended starting lactulose.  CT A/P with contrast with  grossly unremarkable liver, focal fat/altered perfusion along falciform ligament, mild bladder wall thickening although under distention, mild rectal wall thickening with perirectal stranding.  Recommended patient have complete colonoscopy at [facility] to help get past the fixed rectosigmoid area as Dr. Oneida Alar attempted in June 2020, but could not get around the colon.  Severe diverticulosis with fixed rectosigmoid colon, internal hemorrhoids likely source of rectal bleeding.   Today:  Intermittent nausea and vomiting x1 year. Doesn't happen often. Can go 2-3 months without symptoms. Last occurred Sunday, started later in the evening after dinner. Had spaghetti that evening. No issues with spaghetti in the past. Denies typical reflux symptoms or dysphagia. No abdominal pain. Symptoms lasted into Monday. Tuesday he was feeling better and continues to feel well today. Denies cold or flu like symptoms, ever or chills. No hematemesis. Denies NSAIDs.   Can't remember details of prior symptoms.  No specific dietary triggers. Doesn't think prior episodes lasted as long. Between episodes, he feels well. Eating well. At baseline, 2 main meals per day. Weight is stable compared to weight in March 2021 at our office.   Bowels moving well without brbpr or melena.   Alcohol: 1/5 of liquor last him about 2 weeks. Has been cutting back over the last year.   Daughter reports ongoing trouble with mentation. Reports he is slower than he used to be and doesn't remember very well. No motivation. Head CT in June 2022 with generalized atrophy with mild microvascular ischemic changes.  Daughter reports they were told this is from age and chronic alcohol use. Has follow-up with neurology.  Taking Lactulose once a day. Not measuring. Usually with about 1 BM per day.    Just prior to leaving the room, patient's daughter, Scott Jackson, asked for me to look in her chart and see when her next appointment was  Past Medical  History:  Diagnosis Date   ETOH abuse    Gastritis and duodenitis    Hypertension    Pulmonary nodule    Tobacco abuse     Past Surgical History:  Procedure Laterality Date   ANKLE FRACTURE SURGERY     APPENDECTOMY     BIOPSY  01/08/2019   Procedure: BIOPSY;  Surgeon: Danie Binder, MD;  [facility];  Service: Endoscopy;;  gastric   COLONOSCOPY  01/2015   Dr. Posey Pronto: two 2-23mm rectal polyps removed, hyperplastic   COLONOSCOPY  2007   Dr. Posey Pronto: three adenomas removed measuring 4, 10, 35mm. diverticulosis   COLONOSCOPY WITH PROPOFOL N/A 01/08/2019   Dr. Oneida Alar: Severe diverticulosis with fixed rectosigmoid region, rectal bleeding likely due to internal hemorrhoids.  Recommended repeat colonoscopy at [facility] because examination was incomplete due to fixed rectosigmoid colon (patient never followed through).   ESOPHAGOGASTRODUODENOSCOPY (EGD) WITH PROPOFOL N/A 01/08/2019   Dr. Oneida Alar: Low-grade narrowing Schatzki ring at the GE junction, medium sized hiatal hernia, erosive gastritis/duodenitis in the setting of NSAID and alcohol use.  Gastric biopsy showed gastropathy but no H. pylori   intestininal blockage  1970    Current Outpatient Medications  Medication Sig Dispense Refill   acetaminophen (TYLENOL) 325 MG tablet Take 2 tablets (650 mg total) by mouth every 6 (six) hours as needed for mild pain (or Fever >/= 101). 12 tablet 0   albuterol (VENTOLIN HFA) 108 (90 Base) MCG/ACT inhaler Inhale 2 puffs into the lungs every 4 (four) hours as needed for wheezing or shortness of breath. 18 g 1   amLODipine (NORVASC) 10 MG tablet Take 1 tablet (10 mg total) by mouth daily. 90 tablet 3   fluticasone furoate-vilanterol (BREO ELLIPTA) 200-25 MCG/INH AEPB Inhale 1 puff into the lungs daily. (Patient taking differently: Inhale 1 puff into the lungs daily. As needed) 60 each 3   folic acid (FOLVITE) 1 MG tablet Take 1 tablet (1 mg total) by mouth daily. 30 tablet 5   hydrALAZINE  (APRESOLINE) 50 MG tablet Take 1 tablet (50 mg total) by mouth 3 (three) times daily. 90 tablet 3   lactulose (CHRONULAC) 10 GM/15ML solution Take 30 mLs (20 g total) by mouth daily. To have 2-3 soft stools daily to keep ammonia down and stay mentally clear. 1892 mL 0   levETIRAcetam (KEPPRA) 500 MG tablet Take 1 tablet (500 mg total) by mouth 2 (two) times daily. 180 tablet 3   lisinopril-hydrochlorothiazide (ZESTORETIC) 20-12.5 MG tablet Take 1 tablet by mouth every morning. 90 tablet 3   Multiple Vitamin (MULTIVITAMIN WITH MINERALS) TABS tablet Take 1 tablet by mouth daily. 30 tablet 3   omeprazole (PRILOSEC) 40 MG capsule Take 1 capsule (40 mg total) by mouth daily. 30 capsule 3   PARoxetine (PAXIL) 40 MG tablet Take 1 tablet (40 mg total) by mouth every morning. 30 tablet 3   tamsulosin (FLOMAX) 0.4 MG CAPS capsule Take 1 capsule (0.4 mg total) by mouth daily. 30 capsule 3   thiamine 100 MG tablet Take 1 tablet (100 mg total) by mouth daily. 30 tablet 3   isosorbide mononitrate (IMDUR) 30 MG 24 hr tablet Take 1 tablet (30 mg total) by mouth daily. (Patient  not taking: Reported on 05/20/2021) 30 tablet 3   No current facility-administered medications for this visit.    Allergies as of 05/20/2021   (No Known Allergies)    Family History  Problem Relation Age of Onset   Hypertension Mother    Stroke Mother    Fibromyalgia Daughter    Lupus Daughter    Anxiety disorder Daughter    Post-traumatic stress disorder Daughter    Asthma Daughter    Breast cancer Sister    Breast cancer Sister    Colon cancer Neg Hx     Social History   Socioeconomic History   Marital status: Legally Separated    Spouse name: Not on file   Number of children: Not on file   Years of education: Not on file   Highest education level: Not on file  Occupational History   Not on file  Tobacco Use   Smoking status: Every Day    Packs/day: 0.25    Years: 50.00    Pack years: 12.50    Types: Cigarettes    Smokeless tobacco: Never  Vaping Use   Vaping Use: Never used  Substance and Sexual Activity   Alcohol use: Yes    Comment: every few days; 1/5 liquor every 2 weeks, previously daily ETOH use.   Drug use: No   Sexual activity: Yes  Other Topics Concern   Not on file  Social History Narrative   Works at [employer] in [place], [place].   Separated.   [place], hunting.    Social Determinants of Health   Financial Resource Strain: Not on file  Food Insecurity: Not on file  Transportation Needs: Not on file  Physical Activity: Not on file  Stress: Not on file  Social Connections: Not on file    Review of Systems: Gen: Denies fever, chills, Cold flulike symptoms, presyncope, syncope. CV: Denies chest pain, palpitations. Resp: Denies dyspnea or cough.  GI: See HPI Heme: See HPI  Physical Exam: BP (!) 142/86   Pulse 78   Temp (!) 97.5 F (36.4 C)   Ht 6\' 1"  (1.854 m)   Wt 137 lb 3.2 oz (62.2 kg)   BMI 18.10 kg/m  General:   Alert and oriented. No distress noted. Pleasant and cooperative.  Head:  Normocephalic and atraumatic. Eyes:  Conjuctiva clear without scleral icterus. Heart:  S1, S2 present without murmurs appreciated. Lungs:  Clear to auscultation bilaterally. No wheezes, rales, or rhonchi. No distress.  Abdomen:  +BS, soft, non-tender and non-distended. No rebound or guarding. No HSM or masses noted. Msk:  Symmetrical without gross deformities. Normal posture. Extremities:  Without edema. Neurologic:  Alert and  oriented x4. Noted right hand tremor when assessing for asterixis. Per patient this is chronic, following with neurology.  Psych:  Normal mood and affect.    Assessment: 70 year old male with history of alcohol abuse, chronic tobacco use, HTN, erosive gastritis/duodenitis in the setting of NSAIDs and ETOH, elevated ammonia, abnormal CT scan of colon June 2020 and April 2021, previously recommended colonoscopy at [facility] due to incomplete exam  locally, but never completed, presenting today with chief complaint of intermittent nausea with vomiting.   Nausea/vomiting: Intermittent, every 2-3 months x 1 year without identified trigger. Symptoms last 24 hours or less. Feels well between episodes. Denies associated abdominal pain, early satiety, typical reflux symptoms, dysphagia, hematemesis, brbpr, or melena. Not currently on PPI though previously recommended daily indefinite use in the setting of erosive gastritis/duodenitis on EGD in  2020, biopsies negative for H. pylori. Denies NSAIDs. Continues drinking alcohol, but working on decreasing. Continues smoking. Abdominal exam is benign.   Symptoms are non-specific at this time. Query intermittent flares of atypical GERD/gastritis as most recent episode followed Spaghetti. May have had acute viral gastroenteritis though I would not anticipate this to recur every few months. Chronic alcohol use likely influencing symptoms. Will plan to start PPI daily and monitor for now.   Abnormal CT colon:  Prior CT pelvis in June 2020 due to incomplete colonoscopy showed scattered colonic diverticulosis, possible mild wall thickening of the cecum and ascending colon, possibly due to limited distention.  CT  A/P with contrast April 2021 with mild rectal wall thickening with perirectal stranding.  He has not had any significant lower GI symptoms. Denies brbpr or melena. Weight has been stable over the last year. He does have history of colon polyps. He had been referred to [facility] for colonoscopy due to incomplete exam locally (secondary to fixed rectosigmoid colon), but has not had this completed as he didn't want to travel. Discussed need for colonoscopy at [facility] with patient and his daughter today; patient is agreeable.  Increased ammonia:  Previously noted mildly elevated ammonia at 74 in March 2021.  This was checked due to daughter reporting some confusion and memory issues.  CT A/P with  contrast revealed grossly unremarkable liver.  He was started on lactulose daily which he continues.  Typically 1 BM per day.  Daughter feels mentation is somewhat worse, slow with his thinking and poor memory.  Recent CT head June 2022 with generalized atrophy with mild microvascular ischemic changes. On exam today, he is alert and oriented x4.  When assessing for asterixis, noted isolated right hand tremor which patient and daughter report is chronic, following with neurology.  Overall, doubt hepatic encephalopathy as liver appeared normal, platelets normal, spleen normal.  Unclear why patient had mildly elevated ammonia previously, but will recheck today to ensure normalization with daily lactulose which he will cotinue for now. Otherwise, he will continue to follow with neurology for memory changes.    Plan:  Refer to [facility] for colonoscopy. Check ammonia Start omeprazole 40 mg daily. Counseled on GERD diet/lifestyle.  Written instructions provided. Continue to avoid NSAIDs. Continue working towards alcohol and smoking cessation. Continue lactulose 30 mL daily. Follow-up in 4 months or sooner if needed. Advised to call if recurrent nausea/vomiting.   Aliene Altes, PA-C [facility]astroenter[facility] 05/20/2021

## 2021-05-20 ENCOUNTER — Other Ambulatory Visit: Payer: Self-pay

## 2021-05-20 ENCOUNTER — Encounter: Payer: Self-pay | Admitting: Gastroenterology

## 2021-05-20 ENCOUNTER — Ambulatory Visit (INDEPENDENT_AMBULATORY_CARE_PROVIDER_SITE_OTHER): Payer: Medicare HMO | Admitting: Gastroenterology

## 2021-05-20 VITALS — BP 142/86 | HR 78 | Temp 97.5°F | Ht 73.0 in | Wt 137.2 lb

## 2021-05-20 DIAGNOSIS — R112 Nausea with vomiting, unspecified: Secondary | ICD-10-CM | POA: Insufficient documentation

## 2021-05-20 DIAGNOSIS — R933 Abnormal findings on diagnostic imaging of other parts of digestive tract: Secondary | ICD-10-CM | POA: Diagnosis not present

## 2021-05-20 DIAGNOSIS — Z8719 Personal history of other diseases of the digestive system: Secondary | ICD-10-CM | POA: Diagnosis not present

## 2021-05-20 DIAGNOSIS — R7989 Other specified abnormal findings of blood chemistry: Secondary | ICD-10-CM

## 2021-05-20 DIAGNOSIS — F1010 Alcohol abuse, uncomplicated: Secondary | ICD-10-CM | POA: Diagnosis not present

## 2021-05-20 DIAGNOSIS — R69 Illness, unspecified: Secondary | ICD-10-CM | POA: Diagnosis not present

## 2021-05-20 MED ORDER — OMEPRAZOLE 40 MG PO CPDR: 40.0000 mg | DELAYED_RELEASE_CAPSULE | Freq: Every day | ORAL | 3 refills | Status: DC

## 2021-05-20 NOTE — Patient Instructions (Addendum)
Have blood work completed at [facility]  I suspect your intermittent nausea/vomiting may be secondary to atypical GERD flares versus gastritis versus gastroenteritis.  Start omeprazole 40 mg daily 30 minutes before breakfast.  Follow a GERD diet:  Avoid fried, fatty, greasy, spicy, citrus foods. Avoid caffeine and carbonated beverages. Avoid chocolate. Try eating 4-6 small meals a day rather than 3 large meals. Do not eat within 3 hours of laying down. Prop head of bed up on wood or bricks to create a 6 inch incline.  Continue to work towards alcohol cessation and smoking cessation.  Continue to avoid all NSAID products including ibuprofen, Aleve, Advil, BC powders, Goody powders, and anything that says "NSAID" on the package.  Continue taking lactulose 30 ml daily for now.  We will refer you back to [facility] to get a colonoscopy.  We will plan to see you back in 4 months. Please call with any questions or concerns prior to your next visit.    It was a pleasure to meet you today!    Aliene Altes, PA-C [facility] Gastroenterology

## 2021-05-21 LAB — AMMONIA: Ammonia: 40 umol/L (ref <=72)

## 2021-05-24 DIAGNOSIS — E785 Hyperlipidemia, unspecified: Secondary | ICD-10-CM | POA: Diagnosis not present

## 2021-05-24 DIAGNOSIS — R69 Illness, unspecified: Secondary | ICD-10-CM | POA: Diagnosis not present

## 2021-05-24 DIAGNOSIS — G8929 Other chronic pain: Secondary | ICD-10-CM | POA: Diagnosis not present

## 2021-05-24 DIAGNOSIS — J439 Emphysema, unspecified: Secondary | ICD-10-CM | POA: Diagnosis not present

## 2021-05-24 DIAGNOSIS — I10 Essential (primary) hypertension: Secondary | ICD-10-CM | POA: Diagnosis not present

## 2021-05-27 ENCOUNTER — Other Ambulatory Visit: Payer: Self-pay | Admitting: Gastroenterology

## 2021-05-27 ENCOUNTER — Telehealth: Payer: Self-pay | Admitting: *Deleted

## 2021-05-27 DIAGNOSIS — R112 Nausea with vomiting, unspecified: Secondary | ICD-10-CM

## 2021-05-27 DIAGNOSIS — Z8719 Personal history of other diseases of the digestive system: Secondary | ICD-10-CM

## 2021-05-27 MED ORDER — PANTOPRAZOLE SODIUM 40 MG PO TBEC: 40.0000 mg | DELAYED_RELEASE_TABLET | Freq: Every day | ORAL | 3 refills | Status: DC

## 2021-05-27 NOTE — Telephone Encounter (Signed)
[facility] called wanting to change pt's Omeprazole 40mg  to Pantoprazole 40mg , because of insurance purposes.

## 2021-05-27 NOTE — Telephone Encounter (Signed)
Noted. New Rx sent.

## 2021-05-27 NOTE — Telephone Encounter (Signed)
Noted  

## 2021-06-16 DIAGNOSIS — I959 Hypotension, unspecified: Secondary | ICD-10-CM | POA: Diagnosis not present

## 2021-06-16 DIAGNOSIS — G40909 Epilepsy, unspecified, not intractable, without status epilepticus: Secondary | ICD-10-CM | POA: Diagnosis not present

## 2021-06-16 DIAGNOSIS — G4089 Other seizures: Secondary | ICD-10-CM | POA: Diagnosis not present

## 2021-06-16 DIAGNOSIS — N189 Chronic kidney disease, unspecified: Secondary | ICD-10-CM | POA: Diagnosis not present

## 2021-06-16 DIAGNOSIS — Z20822 Contact with and (suspected) exposure to covid-19: Secondary | ICD-10-CM | POA: Diagnosis not present

## 2021-06-16 DIAGNOSIS — F17210 Nicotine dependence, cigarettes, uncomplicated: Secondary | ICD-10-CM | POA: Diagnosis not present

## 2021-06-16 DIAGNOSIS — R69 Illness, unspecified: Secondary | ICD-10-CM | POA: Diagnosis not present

## 2021-06-16 DIAGNOSIS — M542 Cervicalgia: Secondary | ICD-10-CM | POA: Diagnosis not present

## 2021-06-16 DIAGNOSIS — R569 Unspecified convulsions: Secondary | ICD-10-CM | POA: Diagnosis not present

## 2021-06-16 DIAGNOSIS — Z743 Need for continuous supervision: Secondary | ICD-10-CM | POA: Diagnosis not present

## 2021-07-29 DIAGNOSIS — F419 Anxiety disorder, unspecified: Secondary | ICD-10-CM | POA: Diagnosis not present

## 2021-07-29 DIAGNOSIS — Z0000 Encounter for general adult medical examination without abnormal findings: Secondary | ICD-10-CM | POA: Diagnosis not present

## 2021-07-29 DIAGNOSIS — Z13220 Encounter for screening for lipoid disorders: Secondary | ICD-10-CM | POA: Diagnosis not present

## 2021-07-29 DIAGNOSIS — I10 Essential (primary) hypertension: Secondary | ICD-10-CM | POA: Diagnosis not present

## 2021-07-29 DIAGNOSIS — Z23 Encounter for immunization: Secondary | ICD-10-CM | POA: Diagnosis not present

## 2021-07-29 DIAGNOSIS — R790 Abnormal level of blood mineral: Secondary | ICD-10-CM | POA: Diagnosis not present

## 2021-07-29 DIAGNOSIS — E538 Deficiency of other specified B group vitamins: Secondary | ICD-10-CM | POA: Diagnosis not present

## 2021-07-29 DIAGNOSIS — Z125 Encounter for screening for malignant neoplasm of prostate: Secondary | ICD-10-CM | POA: Diagnosis not present

## 2021-07-29 DIAGNOSIS — Z79899 Other long term (current) drug therapy: Secondary | ICD-10-CM | POA: Diagnosis not present

## 2021-08-03 ENCOUNTER — Other Ambulatory Visit: Payer: Self-pay

## 2021-08-03 DIAGNOSIS — F17210 Nicotine dependence, cigarettes, uncomplicated: Secondary | ICD-10-CM

## 2021-08-03 DIAGNOSIS — Z87891 Personal history of nicotine dependence: Secondary | ICD-10-CM

## 2021-08-30 ENCOUNTER — Ambulatory Visit: Payer: Medicare HMO | Admitting: Neurology

## 2021-09-02 ENCOUNTER — Telehealth: Payer: Self-pay | Admitting: Internal Medicine

## 2021-09-02 NOTE — Telephone Encounter (Signed)
[facility] called and received referral on patient but have not been able to reach him to schedule

## 2021-09-02 NOTE — Telephone Encounter (Signed)
Tried to call pt's daughter [name]), no answer, voicemail full. Letter mailed to pt to call [facility] GI.

## 2021-09-13 ENCOUNTER — Other Ambulatory Visit: Payer: Self-pay

## 2021-09-13 ENCOUNTER — Ambulatory Visit (HOSPITAL_COMMUNITY)
Admission: RE | Admit: 2021-09-13 | Discharge: 2021-09-13 | Disposition: A | Discharge status: Home / Self Care | Payer: Medicare Other | Source: Ambulatory Visit | Attending: Family Medicine | Admitting: Family Medicine

## 2021-09-13 DIAGNOSIS — Z87891 Personal history of nicotine dependence: Secondary | ICD-10-CM | POA: Insufficient documentation

## 2021-09-13 DIAGNOSIS — F17210 Nicotine dependence, cigarettes, uncomplicated: Secondary | ICD-10-CM | POA: Insufficient documentation

## 2021-09-15 ENCOUNTER — Telehealth: Payer: Self-pay | Admitting: Acute Care

## 2021-09-15 NOTE — Telephone Encounter (Signed)
Call report please advise  IMPRESSION: 1. Slow progressive enlargement of a cystic lesion in the left lower lobe which has worsening peripheral nodularity when compared to prior examinations, concerning for potential neoplasm such as primary bronchogenic adenocarcinoma, categorized as Lung-RADS 4BS, suspicious. Additional imaging evaluation or consultation with Pulmonology or Thoracic Surgery recommended. 2. The "S" modifier above refers to potentially clinically significant non lung cancer related findings. Specifically, there is aortic atherosclerosis, in addition to left main and 2 vessel coronary artery disease. Please note that although the presence of coronary artery calcium documents the presence of coronary artery disease, the severity of this disease and any potential stenosis cannot be assessed on this non-gated CT examination. Assessment for potential risk factor modification, dietary therapy or pharmacologic therapy may be warranted, if clinically indicated. 3. Mild diffuse bronchial wall thickening with mild centrilobular and paraseptal emphysema; imaging findings suggestive of underlying COPD.   These results will be called to the ordering clinician or representative by the Radiologist Assistant, and communication documented in the PACS or [employer].   Aortic Atherosclerosis (ICD10-I70.0) and Emphysema (ICD10-J43.9).     Electronically Signed   By: Vinnie Langton M.D.   On: 09/15/2021 10:24

## 2021-09-15 NOTE — Telephone Encounter (Signed)
See other telephone note 09/15/21

## 2021-09-15 NOTE — Telephone Encounter (Signed)
I have attempted to call  the patient with the results of his low dose CT Chest. ( Read as a 4B, enlarging LLL nodule) There was no answer on either the cell phone or the home phone. I was able to leave a HIPPA compliant message on the patients cell phone VM. I have asked that they return the call. We will attempt to get in touch with them again tomorrow.

## 2021-09-16 ENCOUNTER — Other Ambulatory Visit: Payer: Self-pay | Admitting: Acute Care

## 2021-09-16 ENCOUNTER — Telehealth: Payer: Self-pay | Admitting: Acute Care

## 2021-09-16 DIAGNOSIS — R911 Solitary pulmonary nodule: Secondary | ICD-10-CM

## 2021-09-16 NOTE — Telephone Encounter (Signed)
See other telephone note 09/16/21.

## 2021-09-16 NOTE — Telephone Encounter (Signed)
I have called the patient with the results of his low dose CT Chest. I explained that he has a slowly growing nodule in his LLL that we need to do further imaging of. He is in agreement with the plan. I have placed ordered for PFT and PET scan at [facility]. The patient states he will need his daughter to help with transportation.  Langley Gauss, I have called the patient, and ordered the PET and PFT's . Thanks so much.

## 2021-09-16 NOTE — Telephone Encounter (Signed)
I have called the patient with the results of his low dose CT Chest. I explained that he has a slowly growing nodule in his LLL that we need to do further imaging of. He is in agreement with the plan. I have placed ordered for PFT and PET scan at [facility]. The patient states he will need his daughter to help with transportation.  Scott Jackson, I have called the patient, and ordered the PET and

## 2021-09-16 NOTE — Telephone Encounter (Signed)
Pt's daughter , Andras Grunewald [facility]) called back to review CT results that were given to pt. I explained to her the CT results per Eric Form, NP and that the next step is to get hims scheduled for a PET scan at [facility].  I advised her that they will be receiving a call to schedule this along with PFT's to evaluate his lung functions. She verbalized understanding and had no further questions.

## 2021-09-28 ENCOUNTER — Other Ambulatory Visit: Payer: Self-pay

## 2021-09-28 ENCOUNTER — Encounter: Payer: Self-pay | Admitting: Neurology

## 2021-09-28 ENCOUNTER — Ambulatory Visit: Payer: Medicare Other | Admitting: Neurology

## 2021-09-28 VITALS — BP 104/67 | HR 96 | Ht 73.0 in | Wt 138.5 lb

## 2021-09-28 DIAGNOSIS — G40909 Epilepsy, unspecified, not intractable, without status epilepticus: Secondary | ICD-10-CM | POA: Diagnosis not present

## 2021-09-28 DIAGNOSIS — F10288 Alcohol dependence with other alcohol-induced disorder: Secondary | ICD-10-CM

## 2021-09-28 DIAGNOSIS — G9349 Other encephalopathy: Secondary | ICD-10-CM

## 2021-09-28 MED ORDER — LEVETIRACETAM 500 MG PO TABS: 1000.0000 mg | ORAL_TABLET | Freq: Two times a day (BID) | ORAL | 3 refills | Status: DC

## 2021-09-28 NOTE — Patient Instructions (Signed)
Levetiracetam Tablets ?What is this medication? ?LEVETIRACETAM (lee ve tye RA se tam) prevents and controls seizures in people with epilepsy. It works by calming overactive nerves in your body. ?This medicine may be used for other purposes; ask your health care provider or pharmacist if you have questions. ?COMMON BRAND NAME(S): Keppra, Roweepra ?What should I tell my care team before I take this medication? ?They need to know if you have any of these conditions: ?Kidney disease ?Suicidal thoughts, plans, or attempt; a previous suicide attempt by you or a family member ?An unusual or allergic reaction to levetiracetam, other medications, foods, dyes, or preservatives ?Pregnant or trying to get pregnant ?Breast-feeding ?How should I use this medication? ?Take this medication by mouth with a glass of water. Follow the directions on the prescription label. Swallow the tablets whole. Do not crush or chew this medication. You may take this medication with or without food. Take your doses at regular intervals. Do not take your medication more often than directed. Do not stop taking this medication or any of your seizure medications unless instructed by your care team. Stopping your medication suddenly can increase your seizures or their severity. ?A special MedGuide will be given to you by the pharmacist with each prescription and refill. Be sure to read this information carefully each time. ?Contact your care team about the use of this medication in children. While this medication may be prescribed for children as young as 58 years of age for selected conditions, precautions do apply. ?Overdosage: If you think you have taken too much of this medicine contact a poison control center or emergency room at once. ?NOTE: This medicine is only for you. Do not share this medicine with others. ?What if I miss a dose? ?If you miss a dose, take it as soon as you can. If it is almost time for your next dose, take only that dose. Do  not take double or extra doses. ?What may interact with this medication? ?This medication may interact with the following: ?Carbamazepine ?Colesevelam ?Probenecid ?Sevelamer ?This list may not describe all possible interactions. Give your health care provider a list of all the medicines, herbs, non-prescription drugs, or dietary supplements you use. Also tell them if you smoke, drink alcohol, or use illegal drugs. Some items may interact with your medicine. ?What should I watch for while using this medication? ?Visit your care team for a regular check on your progress. Wear a medical identification bracelet or chain to say you have epilepsy, and carry a card that lists all your medications. ?This medication may cause serious skin reactions. They can happen weeks to months after starting the medication. Contact your care team right away if you notice fevers or flu-like symptoms with a rash. The rash may be red or purple and then turn into blisters or peeling of the skin. Or, you might notice a red rash with swelling of the face, lips or lymph nodes in your neck or under your arms. ?It is important to take this medication exactly as instructed by your care team. When first starting treatment, your dose may need to be adjusted. It may take weeks or months before your dose is stable. You should contact your care team if your seizures get worse or if you have any new types of seizures. ?You may get drowsy or dizzy. Do not drive, use machinery, or do anything that needs mental alertness until you know how this medication affects you. Do not stand or sit up quickly,  especially if you are an older patient. This reduces the risk of dizzy or fainting spells. Alcohol may interfere with the effect of this medication. Avoid alcoholic drinks. ?The use of this medication may increase the chance of suicidal thoughts or actions. Pay special attention to how you are responding while on this medication. Any worsening of mood, or  thoughts of suicide or dying should be reported to your care team right away. ?Women who become pregnant while using this medication may enroll in the [place] Pregnancy Registry by calling [phone]. This registry collects information about the safety of antiepileptic medication use during pregnancy. ?What side effects may I notice from receiving this medication? ?Side effects that you should report to your care team as soon as possible: ?Allergic reactions--skin rash, itching, hives, swelling of the face, lips, tongue, or throat ?Increase in blood pressure in children ?Infection--fever, chills, cough, or sore throat ?Loss of balance or coordination ?Low red blood cell count--unusual weakness or fatigue, dizziness, headache, trouble breathing ?Mood and behavior changes--anxiety, nervousness, confusion, hallucinations, irritability, hostility, thoughts of suicide or self-harm, worsening mood, feelings of depression ?Redness, blistering, peeling, or loosening of the skin, including inside the mouth ?Trouble walking ?Unusual bruising or bleeding ?Unusual weakness or fatigue ?Side effects that usually do not require medical attention (report to your care team if they continue or are bothersome): ?Dizziness ?Drowsiness ?Fatigue ?Irritability ?Loss of appetite ?This list may not describe all possible side effects. Call your doctor for medical advice about side effects. You may report side effects to FDA at 1-800-FDA-1088. ?Where should I keep my medication? ?Keep out of reach of children. ?Store at room temperature between 15 and 30 degrees C (59 and 86 degrees F). Throw away any unused medication after the expiration date. ?NOTE: This sheet is a summary. It may not cover all possible information. If you have questions about this medicine, talk to your doctor, pharmacist, or health care provider. ?? 2022 [employer] Standard (2020-11-06 00:00:00) ? ?

## 2021-09-28 NOTE — Progress Notes (Signed)
Provider:  Larey Seat, M D  Referring Provider: Caren Macadam, MD Primary Care Physician:  Caren Macadam, MD  Chief Complaint  Patient presents with   Follow-up    RM 18, w daughter. Here for sz f/u, last seen 05/07/20. Pt had been falling per daughter, nothing recently. Doesn't believe its sz related. Pt wondering if his last scan showed signs of dementia.     HPI:   09-28-2021: RV : Scott Jackson is a 71 y.o. male of african - american descent , right handed , is seen here upon revisit  from Dr. Caren Macadam   Patient is here with his granddaughter, who reports he is due for a PET scan of the lung, suspected to have a tumor, there were changes in his CTs. He has been depressed , GDS at 7/ 15 points. He looses weight , muscle mass loss. He had his last seizure months ago, in 23-05-2021 in [facility] /. He is doing well on the higher keppra dose his physicians started there.  .  Daughter concerned about dementia- certainly a likely candidate with alcohol abuse , smoking and seizures- and with a family history of dementia. His CT shows significant atrophy, and plumper gyri.  He is unable to read, not due to vision problems. He graduated HS.  MMSE today was reduced by 2 points for reading/ writing.  He did a good drawing of 2 interlocking pentagrams and he could draw a clock face.    21/ 27 - MMSE. 09-28-2021.    05-07-2020.  Scott Jackson is a 71 y.o. male of african - american descent , right handed , is seen here upon revisit  from Dr. Caren Macadam for an evaluation of recurrent seizure activity- leading to ED -Visit , not hospitalization.  Scott Jackson is seen here today on 07 May 2020 in the presence of a family member.  He was reportedly presenting to the ED in [place] in mid September after having suffered several seizures at home while in the shower. Scott Jackson wife had called their daughter to help her as she witnessed a seizure.  Scott Jackson eyes were  open but rolled back he could not respond to verbal or tactile stimuli, he seems to have not come back to baseline when he went into another tonic-clonic extension of his body.  A whole body generalized tonic clonic seizure followed he became very pale, his head and neck were turned to the right , his eyes were rolled up,  and shortly after this generalized seizure took place he had to vomit violently.  EMS was called and witnessed yet another seizure and on the way to the emergency room. We have no ED records- and no summary for the patient, either. His daughter stated he started on a seizure medication. Now for recurrent seizures , Keppra 500 mg bid po.  CT was repeated, ECG and labs were done- no record of results.  Alcohol cessation-he has not been able to stay sober-  not clear if any tox screen was done.   His recent EEG was abnormal.  He has a history of alcohol and tobacco abuse, malnourishment, emphysema and atherosclerosis.   On 01-30-2019 Scott Jackson presented to the Emergency Room at [facility], after he had collapsed and lost awareness of his surroundings. He had been at the home of a friend, meeting others on the porch- he consumed alcohol . He felt hot and shaky before he passed  out. There had been no primary witness account made available but EMS was called and reportedly witnessed some seconds of tonic clonic activity, and he was clammy.  Eyes open but turned up.  Unknown how long he was out. He had consumed alcohol. He had been smoking. He doesn't drink water.  He is on antianxiety and hypertension medication.   No previous seizures.   Scott Jackson is hard to understand, appears jittery. repetitive and tangential- daughter concerned about dementia.  He was dehydrated when in ED-and had hyponatremia, hypochloremia and elevated creatinine and tested negative for opiates, cocaine etc.  Alcohol test negative. He is one of 7 siblings and takes care of his 64 year old mother.    Review of Systems: Out of a complete 14 system review, the patient complains of only the following symptoms, and all other reviewed systems are negative.  Jittery, trembling, dysarthria and balance. Sweating a lot - when not active.   Multiple seizures.    Social History   Socioeconomic History   Marital status: Legally Separated    Spouse name: Not on file   Number of children: Not on file   Years of education: Not on file   Highest education level: Not on file  Occupational History   Not on file  Tobacco Use   Smoking status: Every Day    Packs/day: 0.25    Years: 50.00    Pack years: 12.50    Types: Cigarettes   Smokeless tobacco: Never  Vaping Use   Vaping Use: Never used  Substance and Sexual Activity   Alcohol use: Yes    Comment: every few days; 1/5 liquor every 2 weeks, previously daily ETOH use.   Drug use: No   Sexual activity: Yes  Other Topics Concern   Not on file  Social History Narrative   Works at [employer] in [place], [place].   Separated.   [place], hunting.    Social Determinants of Health   Financial Resource Strain: Not on file  Food Insecurity: Not on file  Transportation Needs: Not on file  Physical Activity: Not on file  Stress: Not on file  Social Connections: Not on file  Intimate Partner Violence: Not on file    Family History  Problem Relation Age of Onset   Hypertension Mother    Stroke Mother    Fibromyalgia Daughter    Lupus Daughter    Anxiety disorder Daughter    Post-traumatic stress disorder Daughter    Asthma Daughter    Breast cancer Sister    Breast cancer Sister    Colon cancer Neg Hx     Past Medical History:  Diagnosis Date   ETOH abuse    Gastritis and duodenitis    Hypertension    Pulmonary nodule    Tobacco abuse     Past Surgical History:  Procedure Laterality Date   ANKLE FRACTURE SURGERY     APPENDECTOMY     BIOPSY  01/08/2019   Procedure: BIOPSY;  Surgeon: Danie Binder, MD;   Location: [facility];  Service: Endoscopy;;  gastric   COLONOSCOPY  01/2015   Dr. Posey Pronto: two 2-11m rectal polyps removed, hyperplastic   COLONOSCOPY  2007   Dr. PPosey Pronto three adenomas removed measuring 4, 10, 179m diverticulosis   COLONOSCOPY WITH PROPOFOL N/A 01/08/2019   Dr. FiOneida AlarSevere diverticulosis with fixed rectosigmoid region, rectal bleeding likely due to internal hemorrhoids.  [facility]  to [facility]osigmoid colon (patient never followed through).   ESOPHAGOGASTRODUODENOSCOPY (EGD) WITH PROPOFOL N/A 01/08/2019   Dr. Oneida Alar: Low-grade narrowing Schatzki ring at the GE junction, medium sized hiatal hernia, erosive gastritis/duodenitis in the setting of NSAID and alcohol use.  Gastric biopsy showed gastropathy but no H. pylori   intestininal blockage  1970    Current Outpatient Medications  Medication Sig Dispense Refill   acetaminophen (TYLENOL) 325 MG tablet Take 2 tablets (650 mg total) by mouth every 6 (six) hours as needed for mild pain (or Fever >/= 101). 12 tablet 0   albuterol (VENTOLIN HFA) 108 (90 Base) MCG/ACT inhaler Inhale 2 puffs into the lungs every 4 (four) hours as needed for wheezing or shortness of breath. 18 g 1   amLODipine (NORVASC) 10 MG tablet Take 1 tablet (10 mg total) by mouth daily. 90 tablet 3   fluticasone furoate-vilanterol (BREO ELLIPTA) 200-25 MCG/INH AEPB Inhale 1 puff into the lungs daily. (Patient taking differently: Inhale 1 puff into the lungs daily. As needed) 60 each 3   folic acid (FOLVITE) 1 MG tablet Take 1 tablet (1 mg total) by mouth daily. 30 tablet 5   hydrALAZINE (APRESOLINE) 50 MG tablet Take 1 tablet (50 mg total) by mouth 3 (three) times daily. 90 tablet 3   isosorbide mononitrate (IMDUR) 30 MG 24 hr tablet Take 1 tablet (30 mg total) by mouth daily. 30 tablet 3   lactulose (CHRONULAC) 10 GM/15ML solution Take 30 mLs (20 g total) by mouth daily. To have 2-3  soft stools daily to keep ammonia down and stay mentally clear. 1892 mL 0   levETIRAcetam (KEPPRA) 500 MG tablet Take 1 tablet (500 mg total) by mouth 2 (two) times daily. 180 tablet 3   lisinopril-hydrochlorothiazide (ZESTORETIC) 20-12.5 MG tablet Take 1 tablet by mouth every morning. 90 tablet 3   Multiple Vitamin (MULTIVITAMIN WITH MINERALS) TABS tablet Take 1 tablet by mouth daily. 30 tablet 3   pantoprazole (PROTONIX) 40 MG tablet Take 1 tablet (40 mg total) by mouth daily before breakfast. 30 tablet 3   PARoxetine (PAXIL) 40 MG tablet Take 1 tablet (40 mg total) by mouth every morning. 30 tablet 3   tamsulosin (FLOMAX) 0.4 MG CAPS capsule Take 1 capsule (0.4 mg total) by mouth daily. 30 capsule 3   thiamine 100 MG tablet Take 1 tablet (100 mg total) by mouth daily. 30 tablet 3   No current facility-administered medications for this visit.    Allergies as of 09/28/2021   (No Known Allergies)    Order: [id number]  Status: Final result Visible to patient: No (not released) Next appt: 05/13/2019 at 10:00 AM in Gastroenterology Neil Crouch, PA-C)    Ref Range & Units 38moago   Alcohol, Ethyl (B) <10 mg/dL <10   Comment: (NOTE)  Lowest detectable limit for serum alcohol is 10 mg/dL.  For medical purposes only.  [facility] [address] [place] [address]   [facility]     Specimen Collected: 01/30/19 16:42 Last Resulted: 01/30/19 17:33  Lab Flowsheet  Order Details  View Encounter  Lab and Collection Details  Routing  Result History     Other Results from 01/30/2019 CBC with Differential Order: [id number]  Status: Final result Visible to patient: No (not released) Next appt: 05/13/2019 at 10:00 AM in Gastroenterology (Neil Crouch PA-C)    Ref Range & Units 145mogo  43m78moo   WBC 4.0 -  10.5 K/uL 7.0  3.3  RBC 4.22 - 5.81 MIL/uL 4.08 3.50  Hemoglobin 13.0 - 17.0 g/dL 13.5  11.4  HCT 39.0 - 52.0 % 41.4  33.6  MCV 80.0 - 100.0  fL 101.5 96.0   MCH 26.0 - 34.0 pg 33.1  32.6   MCHC 30.0 - 36.0 g/dL 32.6  33.9   RDW 11.5 - 15.5 % 16.4 17.5  Platelets 150 - 400 K/uL 220  170   nRBC 0.0 - 0.2 % 0.3 0.6  Neutrophils Relative % % 80  32   Neutro Abs 1.7 - 7.7 K/uL 5.5  1.1  Lymphocytes Relative % 13  56   Lymphs Abs 0.7 - 4.0 K/uL 0.9  1.9   Monocytes Relative % 7  12   Monocytes Absolute 0.1 - 1.0 K/uL 0.5  0.4   Eosinophils Relative % 0  0   Eosinophils Absolute 0.0 - 0.5 K/uL 0.0  0.0   Basophils Relative % 0  0   Basophils Absolute 0.0 - 0.1 K/uL 0.0  0.0   Immature Granulocytes % 0  0   Abs Immature Granulocytes 0.00 - 0.07 K/uL 0.03  0.00 CM   Comment: Performed at [facility], [address] [place], [place] [address]  Resulting Agency  [facility]     Specimen Collected: 01/30/19 16:42 Last Resulted: 01/30/19 17:28  Lab Flowsheet  Order Details  View Encounter  Lab and Collection Details  Routing  Result History   CM=Additional comments       Basic metabolic panel Order: [id number]   Status: Final result Visible to patient: No (not released) Next appt: 05/13/2019 at 10:00 AM in Gastroenterology Neil Crouch, PA-C)    Ref Range & Units 87moago  233mogo   Sodium 135 - 145 mmol/L 131 141   Potassium 3.5 - 5.1 mmol/L 3.6  3.4  Chloride 98 - 111 mmol/L 90 99   CO2 22 - 32 mmol/L 19 22   Glucose, Bld 70 - 99 mg/dL 139 109  BUN 8 - 23 mg/dL 25 9   Creatinine, Ser 0.61 - 1.24 mg/dL 1.64 0.78   Calcium 8.9 - 10.3 mg/dL 9.3  9.8   GFR calc non Af Amer >60 mL/min 43 60 mL/min" class="[id number]">60   GFR calc Af Amer >60 mL/min 49 60 mL/min" class="[id number]">60   Anion gap 5 - 15 >20 20CM   Comment: [facility] at [facility] [place] [id number] Agency  [facility]     Specimen Collected: 01/30/19 16:42 Last Resulted: 01/30/19 17:53  Lab Flowsheet  Order Details  View Encounter  Lab and Collection Details  Routing  Result  History   CM=Additional comments       Urine rapid drug screen (hosp performed) Order: [id number] Status: Final result Visible to patient: No (not released) Next appt: 05/13/2019 at 10:00 AM in Gastroenterology (LNeil CrouchPA-C)    Ref Range & Units 59m759moo  26yr59yr   Opiates NONE DETECTED NONE DETECTED  NEGATIVE R   Cocaine NONE DETECTED NONE DETECTED    Benzodiazepines NONE DETECTED NONE DETECTED  NEGATIVE R   Amphetamines NONE DETECTED NONE DETECTED    Tetrahydrocannabinol NONE DETECTED NONE DETECTED    Barbiturates NONE DETECTED NONE DETECTED  NEGATIVE R   Comment: (NOTE)  DRUG SCREEN FOR MEDICAL PURPOSES  ONLY. IF CONFIRMATION IS NEEDED  FOR ANY PURPOSE, NOTIFY LAB  WITHIN 5 DAYS.  LOWEST DETECTABLE LIMITS  FOR URINE DRUG SCREEN  Drug Class Cutoff (ng/mL)  Amphetamine and metabolites 1000  Barbiturate and metabolites 200  Benzodiazepine 295  Tricyclics and metabolites 300  Opiates and metabolites 300  Cocaine and metabolites 300  THC 50  Performed at [facility], [address] [place], [place] [address]    Resulting Agency  [facility]     Specimen Collected: 01/30/19 16:28 Last Resulted: 01/30/19 19:00  Lab Flowsheet  Order Details  View Encounter  Lab and Collection Details  Routing  Result History   R=Reference range differs from displayed range       Vitals: BP 104/67    Pulse 96    Ht '6\' 1"'$  (1.854 m)    Wt 138 lb 8 oz (62.8 kg)    BMI 18.27 kg/m  Last Weight:  Wt Readings from Last 1 Encounters:  09/28/21 138 lb 8 oz (62.8 kg)   Last Height:   Ht Readings from Last 1 Encounters:  09/28/21 '6\' 1"'$  (1.854 m)    Physical exam:  General: The patient is awake, alert and appears not in acute distress. The patient is groomed but malnourished. . Head: Normocephalic, atraumatic.  Neck is supple. Mallampati3, neck circumference:14.5 " Cardiovascular:  Regular rate and rhythm, without  murmurs or carotid bruit, and without distended neck  veins. Respiratory: Lungs are clear to auscultation. Skin:  Without evidence of edema, or rash Trunk: BMI is too low- under weight.    Neurologic exam : The patient is awake and alert, oriented to place and time.  Memory subjective described as "Ok". There is no normal attention span & concentration ability. Speech is non- fluent with dysarthria, dysphonia and repetitive , aphasia. He is very quiet. He speaks "jittery".    Mood and affect are calm.   Cranial nerves: Pupils are equal and briskly reactive to light. Funduscopic exam deferred.  Extraocular movements  in vertical and horizontal planes intact and without nystagmus.  Visual fields by finger perimetry are intact. Hearing to finger rub intact.  Facial sensation intact to fine touch. Facial motor strength is symmetric and tongue and uvula move midline.  Tongue protrusion into either cheek is normal.  Shoulder shrug is normal.   Motor exam:   Normal tone ,but atrophied muscle bulk and symmetric strength in upper extremities. He has a droopy shoulder and he limps with the right leg.  Sensory:  Fine touch, pinprick and vibration were tested in all extremities- he was cooperative but he reports no numbness.  Proprioception was normal.  Coordination:  He was well able to draw a archimedes spiral, but he can't get a shirt buttoned, or a coin out of a pocket.  Rapid alternating movements in the fingers/hands were normal. Finger-to-nose maneuver  normal without evidence of ataxia, dysmetria or tremor.  Gait and station: Patient walks without assistive device - Strength within normal limits. Stance is unstable and he limps on the right, turned with 6 steps. Wide based. Ataxia.    Tandem gait is severely fragmented. Romberg testing is positive  Deep tendon reflexes: in the  upper and lower extremities are symmetric and intact.  Babinski maneuver response is downgoing.   Assessment:  35 minutes -After physical and neurologic examination,  review of laboratory studies, imaging, neurophysiology testing and pre-existing records, assessment is that of :  6 seizures now in total, series of TC events in 05-2021- his family has felt he wasn't drinking on that  day   Dementia, alcohol induced, brain atrophy.  We reviewed his CT head studies, there is a lot of atrophy.   He has improved in cognition since being sober and on meds.    Plan:  Treatment plan and additional workup :  Keppra will be taken at 2 of  500 mg bid, consider the low BMI.  He was given a prescription for 1000 mg bid from [place] [facility]/    He should not take tylenol with his liver damage, but take lactulose to eliminate Ammonia.  Gabapentin at night may help reduce alcohol cravings. '100mg'$  in AM and 200 mg at bedtime.  Encourage alcohol treatment , AA- consider  [facility].  HYDRATION with water, Gatorade, and increased protein intake.    RV in 12 months with PCP and here for adjusted MMSE ( taking out reading and writing tasks).   Asencion Partridge Johnye Kist MD 09/28/2021

## 2021-09-30 ENCOUNTER — Ambulatory Visit (HOSPITAL_COMMUNITY)
Admission: RE | Admit: 2021-09-30 | Discharge: 2021-09-30 | Disposition: A | Discharge status: Home / Self Care | Payer: Medicare Other | Source: Ambulatory Visit | Attending: Acute Care | Admitting: Acute Care

## 2021-09-30 ENCOUNTER — Other Ambulatory Visit: Payer: Self-pay

## 2021-09-30 DIAGNOSIS — C3490 Malignant neoplasm of unspecified part of unspecified bronchus or lung: Secondary | ICD-10-CM | POA: Diagnosis not present

## 2021-09-30 DIAGNOSIS — R911 Solitary pulmonary nodule: Secondary | ICD-10-CM | POA: Diagnosis not present

## 2021-09-30 MED ORDER — FLUDEOXYGLUCOSE F - 18 (FDG) INJECTION
6.6700 | Freq: Once | INTRAVENOUS | Status: AC | PRN
  Administered 2021-09-30: 11:00:00 6.67 via INTRAVENOUS

## 2021-10-07 ENCOUNTER — Telehealth: Payer: Self-pay | Admitting: Acute Care

## 2021-10-07 NOTE — Telephone Encounter (Signed)
I have called the patient's daughter [name]  with the results of his PET scan. I explained that the scan was negative for a cancer. I explained that we will do a repeat CT Chest in 3 months to make sure the area of concern is stable. [name] is in agreement with this plan and she was calling her father with results as soon as we hung up. He has requested that I manage his results through [place], as well as any scheduling.  ?Langley Gauss, I will order the 3 month follow up CT Chest. If this is ok, he will return to the screening population 1 year after this scan.  ?Please fax results to PCP. Thanks so much ?

## 2021-10-07 NOTE — Progress Notes (Deleted)
? ? ?Referring Provider: Caren Macadam, MD ?Primary Care Physician:  Caren Macadam, MD ?Primary GI Physician: Dr. Abbey Chatters ? ?No chief complaint on file. ? ? ?HPI:   ?Scott Jackson is a 71 y.o. male  ? ? ?History of HTN, alcohol abuse, elevated AST previously though normal since 10/2019, elevated ammonia though unremarkable liver on CT, erosive gastritis/duodenitis in the setting of NSAIDs and alcohol, abnormal CT scan: June 2020 and April 2021, previously recommended colonoscopy at [facility] due to incomplete exam locally in 2020 secondary to fixed rectosigmoid area, but this has not been completed.  Also with history of intermittent nausea and vomiting starting in late 2021/early 2022, presenting today for follow-up. ? ?Last seen in our office 05/20/2021.  Reported intermittent nausea and vomiting x1 year though symptoms fairly infrequent stating he can go 2 to 3 months without any trouble.  Reports his most recent episode was in the evening after eating spaghetti.  Denies typical reflux symptoms, dysphagia, abdominal pain.  Denied NSAIDs.  Between episodes of nausea, he feels very well.  At baseline, eats 2 main meals per day.  Weight was stable.  Bowels are moving well.  He has been trying to cut back on his alcohol use and was drinking 1/5 of liquor in about 2 weeks time.  Daughter reported ongoing trouble with mentation, slower than he used to be, does not remember very well.  No motivation.  Noted head CT and June 2022 with generalized atrophy with mild microvascular ischemic changes.  He was following with neurology and was taking lactulose once a day and having 1 bowel movement per day.  Query whether his intermittent nausea and vomiting was related to atypical GERD/gastritis, and chronic alcohol use.  Recommended starting omeprazole 40 mg daily.  He was referred back to [facility] to schedule colonoscopy.  Also planned to recheck ammonia to ensure this was not increasing.  Overall, suspected mental  status changes were less likely secondary to hepatic encephalopathy as he has no overt cirrhosis.  Recommended continuing lactulose and following with neurology.  Encouraged to continue to work towards alcohol cessation. ? ?Ammonia was within normal limits. ? ?Omeprazole was changed to pantoprazole due to insurance purposes. ? ?Ultimately, [facility] was unable to reach patient. ? ?Today:  ? ?Nausea and vomiting: ? ?Abnormal CT scan, colon: ? ?Alcohol abuse/increased ammonia level previously: Consider updating ultrasound. ? ?Past Medical History:  ?Diagnosis Date  ? ETOH abuse   ? Gastritis and duodenitis   ? Hypertension   ? Pulmonary nodule   ? Tobacco abuse   ? ? ?Past Surgical History:  ?Procedure Laterality Date  ? ANKLE FRACTURE SURGERY    ? APPENDECTOMY    ? BIOPSY  01/08/2019  ? Procedure: BIOPSY;  Surgeon: Danie Binder, MD;  [facility];  Service: Endoscopy;;  gastric  ? COLONOSCOPY  01/2015  ? Dr. Posey Pronto: two 2-27m rectal polyps removed, hyperplastic  ? COLONOSCOPY  2007  ? Dr. PPosey Pronto three adenomas removed measuring 4, 10, 111m diverticulosis  ? COLONOSCOPY WITH PROPOFOL N/A 01/08/2019  ? Dr. FiOneida AlarSevere diverticulosis with fixed rectosigmoid region, rectal bleeding likely due to internal hemorrhoids.  [facility] to [facility] colon (patient never followed through).  ? ESOPHAGOGASTRODUODENOSCOPY (EGD) WITH PROPOFOL N/A 01/08/2019  ? Dr. FiOneida AlarLow-grade narrowing Schatzki ring at the GE junction, medium sized hiatal hernia, erosive gastritis/duodenitis in the setting of NSAID and alcohol use.  Gastric  biopsy showed gastropathy but no H. pylori  ? intestininal blockage  1970  ? ? ?Current Outpatient Medications  ?Medication Sig Dispense Refill  ? acetaminophen (TYLENOL) 325 MG tablet Take 2 tablets (650 mg total) by mouth every 6 (six) hours as needed for mild pain (or Fever >/= 101). 12 tablet 0  ? albuterol  (VENTOLIN HFA) 108 (90 Base) MCG/ACT inhaler Inhale 2 puffs into the lungs every 4 (four) hours as needed for wheezing or shortness of breath. 18 g 1  ? amLODipine (NORVASC) 10 MG tablet Take 1 tablet (10 mg total) by mouth daily. 90 tablet 3  ? fluticasone furoate-vilanterol (BREO ELLIPTA) 200-25 MCG/INH AEPB Inhale 1 puff into the lungs daily. (Patient taking differently: Inhale 1 puff into the lungs daily. As needed) 60 each 3  ? folic acid (FOLVITE) 1 MG tablet Take 1 tablet (1 mg total) by mouth daily. 30 tablet 5  ? hydrALAZINE (APRESOLINE) 50 MG tablet Take 1 tablet (50 mg total) by mouth 3 (three) times daily. 90 tablet 3  ? isosorbide mononitrate (IMDUR) 30 MG 24 hr tablet Take 1 tablet (30 mg total) by mouth daily. 30 tablet 3  ? lactulose (CHRONULAC) 10 GM/15ML solution Take 30 mLs (20 g total) by mouth daily. To have 2-3 soft stools daily to keep ammonia down and stay mentally clear. 1892 mL 0  ? levETIRAcetam (KEPPRA) 500 MG tablet Take 2 tablets (1,000 mg total) by mouth 2 (two) times daily. 360 tablet 3  ? lisinopril-hydrochlorothiazide (ZESTORETIC) 20-12.5 MG tablet Take 1 tablet by mouth every morning. 90 tablet 3  ? Multiple Vitamin (MULTIVITAMIN WITH MINERALS) TABS tablet Take 1 tablet by mouth daily. 30 tablet 3  ? pantoprazole (PROTONIX) 40 MG tablet Take 1 tablet (40 mg total) by mouth daily before breakfast. 30 tablet 3  ? PARoxetine (PAXIL) 40 MG tablet Take 1 tablet (40 mg total) by mouth every morning. 30 tablet 3  ? tamsulosin (FLOMAX) 0.4 MG CAPS capsule Take 1 capsule (0.4 mg total) by mouth daily. 30 capsule 3  ? thiamine 100 MG tablet Take 1 tablet (100 mg total) by mouth daily. 30 tablet 3  ? ?No current facility-administered medications for this visit.  ? ? ?Allergies as of 10/08/2021  ? (No Known Allergies)  ? ? ?Family History  ?Problem Relation Age of Onset  ? Hypertension Mother   ? Stroke Mother   ? Fibromyalgia Daughter   ? Lupus Daughter   ? Anxiety disorder Daughter   ?  Post-traumatic stress disorder Daughter   ? Asthma Daughter   ? Breast cancer Sister   ? Breast cancer Sister   ? Colon cancer Neg Hx   ? ? ?Social History  ? ?Socioeconomic History  ? Marital status: Legally Separated  ?  Spouse name: Not on file  ? Number of children: Not on file  ? Years of education: Not on file  ? Highest education level: Not on file  ?Occupational History  ? Not on file  ?Tobacco Use  ? Smoking status: Every Day  ?  Packs/day: 0.25  ?  Years: 50.00  ?  Pack years: 12.50  ?  Types: Cigarettes  ? Smokeless tobacco: Never  ?Vaping Use  ? Vaping Use: Never used  ?Substance and Sexual Activity  ? Alcohol use: Yes  ?  Comment: every few days; 1/5 liquor every 2 weeks, previously daily ETOH use.  ? Drug use: No  ? Sexual activity: Yes  ?Other Topics Concern  ?  Not on file  ?Social History Narrative  ? Works at [employer] in [place], [place].  ? Separated.  ? [place], [place].   ? ?Social Determinants of Health  ? ?Financial Resource Strain: Not on file  ?Food Insecurity: Not on file  ?Transportation Needs: Not on file  ?Physical Activity: Not on file  ?Stress: Not on file  ?Social Connections: Not on file  ? ? ?Review of Systems: ?Gen: Denies fever, chills, cold or flulike symptoms, presyncope, syncope. ?CV: Denies chest pain, palpitations. ?Resp: Denies dyspnea or cough. ?GI: See HPI ?Heme: See HPI ? ?Physical Exam: ?There were no vitals taken for this visit. ?General:   Alert and oriented. No distress noted. Pleasant and cooperative.  ?Head:  Normocephalic and atraumatic. ?Eyes:  Conjuctiva clear without scleral icterus. ?Heart:  S1, S2 present without murmurs appreciated. ?Lungs:  Clear to auscultation bilaterally. No wheezes, rales, or rhonchi. No distress.  ?Abdomen:  +BS, soft, non-tender and non-distended. No rebound or guarding. No HSM or masses noted. ?Msk:  Symmetrical without gross deformities. Normal posture. ?Extremities:  Without edema. ?Neurologic:  Alert and  oriented x4 ?Psych:   Normal mood and affect. ? ? ? ?Assessment: ? ? ? ?Plan: ?*** ? ? ?Aliene Altes, PA-C ?[facility] ?10/08/2021 ? ? ?

## 2021-10-08 ENCOUNTER — Ambulatory Visit: Payer: Medicare HMO | Admitting: Gastroenterology

## 2021-10-08 NOTE — Telephone Encounter (Signed)
PET results faxed to PCP with follow up plans included.  ?

## 2021-10-12 ENCOUNTER — Encounter (HOSPITAL_COMMUNITY): Payer: Self-pay | Admitting: Emergency Medicine

## 2021-10-12 ENCOUNTER — Emergency Department (HOSPITAL_COMMUNITY): Payer: Medicare Other

## 2021-10-12 ENCOUNTER — Other Ambulatory Visit: Payer: Self-pay

## 2021-10-12 ENCOUNTER — Inpatient Hospital Stay (HOSPITAL_COMMUNITY)
Admission: EM | Admit: 2021-10-12 | Discharge: 2021-10-15 | DRG: 683 | Disposition: A | Discharge status: Skilled Nursing Facility | Payer: Medicare Other | Attending: Internal Medicine | Admitting: Internal Medicine

## 2021-10-12 DIAGNOSIS — J449 Chronic obstructive pulmonary disease, unspecified: Secondary | ICD-10-CM | POA: Diagnosis present

## 2021-10-12 DIAGNOSIS — G40909 Epilepsy, unspecified, not intractable, without status epilepticus: Secondary | ICD-10-CM

## 2021-10-12 DIAGNOSIS — R9431 Abnormal electrocardiogram [ECG] [EKG]: Secondary | ICD-10-CM

## 2021-10-12 DIAGNOSIS — G9341 Metabolic encephalopathy: Secondary | ICD-10-CM | POA: Diagnosis present

## 2021-10-12 DIAGNOSIS — E878 Other disorders of electrolyte and fluid balance, not elsewhere classified: Secondary | ICD-10-CM | POA: Diagnosis not present

## 2021-10-12 DIAGNOSIS — R41841 Cognitive communication deficit: Secondary | ICD-10-CM | POA: Diagnosis not present

## 2021-10-12 DIAGNOSIS — F1010 Alcohol abuse, uncomplicated: Secondary | ICD-10-CM | POA: Diagnosis present

## 2021-10-12 DIAGNOSIS — Z720 Tobacco use: Secondary | ICD-10-CM | POA: Diagnosis present

## 2021-10-12 DIAGNOSIS — F1027 Alcohol dependence with alcohol-induced persisting dementia: Secondary | ICD-10-CM | POA: Diagnosis not present

## 2021-10-12 DIAGNOSIS — G629 Polyneuropathy, unspecified: Secondary | ICD-10-CM | POA: Diagnosis not present

## 2021-10-12 DIAGNOSIS — Z79899 Other long term (current) drug therapy: Secondary | ICD-10-CM

## 2021-10-12 DIAGNOSIS — Z8249 Family history of ischemic heart disease and other diseases of the circulatory system: Secondary | ICD-10-CM | POA: Diagnosis not present

## 2021-10-12 DIAGNOSIS — G319 Degenerative disease of nervous system, unspecified: Secondary | ICD-10-CM | POA: Diagnosis not present

## 2021-10-12 DIAGNOSIS — I959 Hypotension, unspecified: Secondary | ICD-10-CM | POA: Diagnosis not present

## 2021-10-12 DIAGNOSIS — I10 Essential (primary) hypertension: Secondary | ICD-10-CM | POA: Diagnosis not present

## 2021-10-12 DIAGNOSIS — R627 Adult failure to thrive: Secondary | ICD-10-CM | POA: Diagnosis present

## 2021-10-12 DIAGNOSIS — I2510 Atherosclerotic heart disease of native coronary artery without angina pectoris: Secondary | ICD-10-CM | POA: Diagnosis present

## 2021-10-12 DIAGNOSIS — Z681 Body mass index (BMI) 19 or less, adult: Secondary | ICD-10-CM | POA: Diagnosis not present

## 2021-10-12 DIAGNOSIS — Z741 Need for assistance with personal care: Secondary | ICD-10-CM | POA: Diagnosis not present

## 2021-10-12 DIAGNOSIS — R2689 Other abnormalities of gait and mobility: Secondary | ICD-10-CM | POA: Diagnosis not present

## 2021-10-12 DIAGNOSIS — Z20822 Contact with and (suspected) exposure to covid-19: Secondary | ICD-10-CM | POA: Diagnosis present

## 2021-10-12 DIAGNOSIS — Z9114 Patient's other noncompliance with medication regimen: Secondary | ICD-10-CM | POA: Diagnosis not present

## 2021-10-12 DIAGNOSIS — F17210 Nicotine dependence, cigarettes, uncomplicated: Secondary | ICD-10-CM | POA: Diagnosis not present

## 2021-10-12 DIAGNOSIS — F5000 Anorexia nervosa, unspecified: Secondary | ICD-10-CM | POA: Diagnosis not present

## 2021-10-12 DIAGNOSIS — F10231 Alcohol dependence with withdrawal delirium: Secondary | ICD-10-CM | POA: Diagnosis present

## 2021-10-12 DIAGNOSIS — E876 Hypokalemia: Secondary | ICD-10-CM | POA: Diagnosis not present

## 2021-10-12 DIAGNOSIS — I6782 Cerebral ischemia: Secondary | ICD-10-CM | POA: Diagnosis not present

## 2021-10-12 DIAGNOSIS — F19939 Other psychoactive substance use, unspecified with withdrawal, unspecified: Secondary | ICD-10-CM | POA: Diagnosis not present

## 2021-10-12 DIAGNOSIS — F10239 Alcohol dependence with withdrawal, unspecified: Secondary | ICD-10-CM | POA: Diagnosis not present

## 2021-10-12 DIAGNOSIS — N179 Acute kidney failure, unspecified: Secondary | ICD-10-CM | POA: Diagnosis not present

## 2021-10-12 DIAGNOSIS — J439 Emphysema, unspecified: Secondary | ICD-10-CM | POA: Diagnosis not present

## 2021-10-12 DIAGNOSIS — R55 Syncope and collapse: Secondary | ICD-10-CM | POA: Diagnosis not present

## 2021-10-12 DIAGNOSIS — R569 Unspecified convulsions: Secondary | ICD-10-CM | POA: Diagnosis not present

## 2021-10-12 LAB — CBC WITH DIFFERENTIAL/PLATELET
Abs Immature Granulocytes: 0.01 10*3/uL (ref 0.00–0.07)
Basophils Absolute: 0 10*3/uL (ref 0.0–0.1)
Basophils Relative: 0 %
Eosinophils Absolute: 0 10*3/uL (ref 0.0–0.5)
Eosinophils Relative: 0 %
HCT: 31.3 % — ABNORMAL LOW (ref 39.0–52.0)
Hemoglobin: 10.4 g/dL — ABNORMAL LOW (ref 13.0–17.0)
Immature Granulocytes: 0 %
Lymphocytes Relative: 24 %
Lymphs Abs: 1.1 10*3/uL (ref 0.7–4.0)
MCH: 31.1 pg (ref 26.0–34.0)
MCHC: 33.2 g/dL (ref 30.0–36.0)
MCV: 93.7 fL (ref 80.0–100.0)
Monocytes Absolute: 0.3 10*3/uL (ref 0.1–1.0)
Monocytes Relative: 7 %
Neutro Abs: 3.2 10*3/uL (ref 1.7–7.7)
Neutrophils Relative %: 69 %
Platelets: 184 10*3/uL (ref 150–400)
RBC: 3.34 MIL/uL — ABNORMAL LOW (ref 4.22–5.81)
RDW: 16.6 % — ABNORMAL HIGH (ref 11.5–15.5)
WBC: 4.6 10*3/uL (ref 4.0–10.5)
nRBC: 0 % (ref 0.0–0.2)

## 2021-10-12 LAB — COMPREHENSIVE METABOLIC PANEL
ALT: 10 U/L (ref 0–44)
AST: 25 U/L (ref 15–41)
Albumin: 3.6 g/dL (ref 3.5–5.0)
Alkaline Phosphatase: 48 U/L (ref 38–126)
Anion gap: 15 (ref 5–15)
BUN: 23 mg/dL (ref 8–23)
CO2: 27 mmol/L (ref 22–32)
Calcium: 8.2 mg/dL — ABNORMAL LOW (ref 8.9–10.3)
Chloride: 92 mmol/L — ABNORMAL LOW (ref 98–111)
Creatinine, Ser: 3.58 mg/dL — ABNORMAL HIGH (ref 0.61–1.24)
GFR, Estimated: 18 mL/min — ABNORMAL LOW (ref >60)
Glucose, Bld: 155 mg/dL — ABNORMAL HIGH (ref 70–99)
Potassium: 2.7 mmol/L — CL (ref 3.5–5.1)
Sodium: 134 mmol/L — ABNORMAL LOW (ref 135–145)
Total Bilirubin: 0.4 mg/dL (ref 0.3–1.2)
Total Protein: 6.3 g/dL — ABNORMAL LOW (ref 6.5–8.1)

## 2021-10-12 LAB — MAGNESIUM: Magnesium: 1 mg/dL — ABNORMAL LOW (ref 1.7–2.4)

## 2021-10-12 LAB — ETHANOL: Alcohol, Ethyl (B): <10 mg/dL (ref <10)

## 2021-10-12 MED ORDER — ALBUTEROL SULFATE (2.5 MG/3ML) 0.083% IN NEBU: 2.5000 mg | INHALATION_SOLUTION | RESPIRATORY_TRACT | Status: DC | PRN

## 2021-10-12 MED ORDER — HEPARIN SODIUM (PORCINE) 5000 UNIT/ML IJ SOLN
5000.0000 [IU] | Freq: Three times a day (TID) | INTRAMUSCULAR | Status: DC
  Administered 2021-10-12 – 2021-10-15 (×6): 5000 [IU] via SUBCUTANEOUS
  Filled 2021-10-12 (×6): qty 1

## 2021-10-12 MED ORDER — LORAZEPAM 2 MG/ML IJ SOLN
0.0000 mg | Freq: Two times a day (BID) | INTRAMUSCULAR | Status: DC
  Administered 2021-10-15: 2 mg via INTRAVENOUS
  Filled 2021-10-12: qty 1

## 2021-10-12 MED ORDER — ACETAMINOPHEN 325 MG PO TABS
650.0000 mg | ORAL_TABLET | Freq: Four times a day (QID) | ORAL | Status: DC | PRN
Start: 2021-10-12 — End: 2021-10-15

## 2021-10-12 MED ORDER — ACETAMINOPHEN 650 MG RE SUPP: 650.0000 mg | Freq: Four times a day (QID) | RECTAL | Status: DC | PRN

## 2021-10-12 MED ORDER — FLUTICASONE FUROATE-VILANTEROL 200-25 MCG/ACT IN AEPB
1.0000 | INHALATION_SPRAY | Freq: Every day | RESPIRATORY_TRACT | Status: DC
  Administered 2021-10-13 – 2021-10-15 (×3): 1 via RESPIRATORY_TRACT
  Filled 2021-10-12 (×2): qty 28

## 2021-10-12 MED ORDER — THIAMINE HCL 100 MG PO TABS
100.0000 mg | ORAL_TABLET | Freq: Once | ORAL | Status: AC
  Administered 2021-10-12: 100 mg via ORAL
  Filled 2021-10-12: qty 1

## 2021-10-12 MED ORDER — FOLIC ACID 1 MG PO TABS
1.0000 mg | ORAL_TABLET | Freq: Every day | ORAL | Status: DC
  Administered 2021-10-12 – 2021-10-15 (×4): 1 mg via ORAL
  Filled 2021-10-12 (×4): qty 1

## 2021-10-12 MED ORDER — POTASSIUM CHLORIDE 10 MEQ/100ML IV SOLN
10.0000 meq | Freq: Once | INTRAVENOUS | Status: AC
Start: 2021-10-12 — End: 2021-10-12
  Administered 2021-10-12: 10 meq via INTRAVENOUS
  Filled 2021-10-12: qty 100

## 2021-10-12 MED ORDER — THIAMINE HCL 100 MG PO TABS
100.0000 mg | ORAL_TABLET | Freq: Every day | ORAL | Status: DC
  Administered 2021-10-12 – 2021-10-15 (×4): 100 mg via ORAL
  Filled 2021-10-12 (×4): qty 1

## 2021-10-12 MED ORDER — POTASSIUM CHLORIDE CRYS ER 20 MEQ PO TBCR
40.0000 meq | EXTENDED_RELEASE_TABLET | Freq: Once | ORAL | Status: AC
  Administered 2021-10-12: 40 meq via ORAL
  Filled 2021-10-12: qty 2

## 2021-10-12 MED ORDER — ALBUTEROL SULFATE HFA 108 (90 BASE) MCG/ACT IN AERS: 2.0000 | INHALATION_SPRAY | RESPIRATORY_TRACT | Status: DC | PRN

## 2021-10-12 MED ORDER — SODIUM CHLORIDE 0.9 % IV BOLUS
1000.0000 mL | Freq: Once | INTRAVENOUS | Status: AC
  Administered 2021-10-12: 1000 mL via INTRAVENOUS

## 2021-10-12 MED ORDER — LORAZEPAM 2 MG/ML IJ SOLN
0.0000 mg | Freq: Four times a day (QID) | INTRAMUSCULAR | Status: AC
  Administered 2021-10-12 – 2021-10-13 (×2): 2 mg via INTRAVENOUS
  Filled 2021-10-12 (×2): qty 1

## 2021-10-12 MED ORDER — GABAPENTIN 100 MG PO CAPS
100.0000 mg | ORAL_CAPSULE | Freq: Every morning | ORAL | Status: DC
  Administered 2021-10-13 – 2021-10-15 (×3): 100 mg via ORAL
  Filled 2021-10-12 (×3): qty 1

## 2021-10-12 MED ORDER — LORAZEPAM 2 MG/ML IJ SOLN: 1.0000 mg | INTRAMUSCULAR | Status: DC | PRN

## 2021-10-12 MED ORDER — LEVETIRACETAM 500 MG PO TABS
1000.0000 mg | ORAL_TABLET | Freq: Two times a day (BID) | ORAL | Status: DC
  Administered 2021-10-12 – 2021-10-15 (×6): 1000 mg via ORAL
  Filled 2021-10-12 (×7): qty 2

## 2021-10-12 MED ORDER — THIAMINE HCL 100 MG/ML IJ SOLN: 100.0000 mg | Freq: Every day | INTRAMUSCULAR | Status: DC

## 2021-10-12 MED ORDER — MAGNESIUM SULFATE 2 GM/50ML IV SOLN
2.0000 g | Freq: Once | INTRAVENOUS | Status: AC
  Administered 2021-10-12: 2 g via INTRAVENOUS
  Filled 2021-10-12: qty 50

## 2021-10-12 MED ORDER — LORAZEPAM 1 MG PO TABS
1.0000 mg | ORAL_TABLET | ORAL | Status: DC | PRN
  Administered 2021-10-14: 4 mg via ORAL
  Filled 2021-10-12: qty 4

## 2021-10-12 MED ORDER — POLYETHYLENE GLYCOL 3350 17 G PO PACK: 17.0000 g | PACK | Freq: Every day | ORAL | Status: DC | PRN

## 2021-10-12 MED ORDER — ADULT MULTIVITAMIN W/MINERALS CH
1.0000 | ORAL_TABLET | Freq: Every day | ORAL | Status: DC
  Administered 2021-10-12 – 2021-10-15 (×4): 1 via ORAL
  Filled 2021-10-12 (×4): qty 1

## 2021-10-12 MED ORDER — GABAPENTIN 100 MG PO CAPS
200.0000 mg | ORAL_CAPSULE | Freq: Every day | ORAL | Status: DC
  Administered 2021-10-12: 200 mg via ORAL
  Filled 2021-10-12: qty 2

## 2021-10-12 MED ORDER — LACTATED RINGERS IV BOLUS
1000.0000 mL | Freq: Once | INTRAVENOUS | Status: AC
  Administered 2021-10-12: 1000 mL via INTRAVENOUS

## 2021-10-12 MED ORDER — DEXTROSE-NACL 5-0.9 % IV SOLN: INTRAVENOUS | Status: DC

## 2021-10-12 NOTE — Assessment & Plan Note (Addendum)
No further abnormalities noted ?

## 2021-10-12 NOTE — ED Triage Notes (Signed)
Pt BIB [facility] after call from family reporting seizure activity; per EMS fire dept reports a DT like activity; pt hypotensive upon arrival with 45'O systolic; give 1L NS en route; pt reports he stopped drinking and smoking cigarettes x 3 days ago; prior to Friday pt reports drinking a half of a pint of vodka daily ?

## 2021-10-12 NOTE — ED Notes (Signed)
ED TO INPATIENT HANDOFF REPORT  ED Nurse Name and Phone #:   S Name/Age/Gender Scott Jackson 71 y.o. male Room/Bed: APA10/APA10  Code Status   Code Status: Prior  Home/SNF/Other Home Patient oriented to: self, place, time, and situation Is this baseline? Yes   Triage Complete: Triage complete  Chief Complaint AKI (acute kidney injury) (HCC) [N17.9]  Triage Note Pt BIB [facility] after call from family reporting seizure activity; per EMS fire dept reports a DT like activity; pt hypotensive upon arrival with 70's systolic; give 1L NS en route; pt reports he stopped drinking and smoking cigarettes x 3 days ago; prior to Friday pt reports drinking a half of a pint of vodka daily   Allergies No Known Allergies  Level of Care/Admitting Diagnosis ED Disposition     ED Disposition  Admit   Condition  --   Comment  Hospital Area: [facility] [[id number]]  Level of Care: Telemetry [5]  Covid Evaluation: Confirmed COVID Negative  Diagnosis: AKI (acute kidney injury) [facility]) [[id number]]  Admitting Physician: Onnie Boer [phone]  Attending Physician: Onnie Boer [email or web address]  Estimated length of stay: past midnight tomorrow  Certification:: I certify this patient will need inpatient services for at least 2 midnights          B Medical/Surgery History Past Medical History:  Diagnosis Date   ETOH abuse    Gastritis and duodenitis    Hypertension    Pulmonary nodule    Tobacco abuse    Past Surgical History:  Procedure Laterality Date   ANKLE FRACTURE SURGERY     APPENDECTOMY     BIOPSY  01/08/2019   Procedure: BIOPSY;  Surgeon: West Bali, MD;  [facility];  Service: Endoscopy;;  gastric   COLONOSCOPY  01/2015   Dr. Allena Katz: two 2-35mm rectal polyps removed, hyperplastic   COLONOSCOPY  2007   Dr. Allena Katz: three adenomas removed measuring 4, 10, 15mm. diverticulosis   COLONOSCOPY WITH PROPOFOL N/A 01/08/2019   Dr. Darrick Penna: Severe diverticulosis  with fixed rectosigmoid region, rectal bleeding likely due to internal hemorrhoids.  Recommended repeat colonoscopy at [facility] because examination was incomplete due to fixed rectosigmoid colon (patient never followed through).   ESOPHAGOGASTRODUODENOSCOPY (EGD) WITH PROPOFOL N/A 01/08/2019   Dr. Darrick Penna: Low-grade narrowing Schatzki ring at the GE junction, medium sized hiatal hernia, erosive gastritis/duodenitis in the setting of NSAID and alcohol use.  Gastric biopsy showed gastropathy but no H. pylori   intestininal blockage  1970     A IV Location/Drains/Wounds Patient Lines/Drains/Airways Status     Active Line/Drains/Airways     Name Placement date Placement time Site Days   Peripheral IV 10/12/21 20 G Left Forearm 10/12/21  1400  Forearm  less than 1            Intake/Output Last 24 hours No intake or output data in the 24 hours ending 10/12/21 2003  Labs/Imaging Results for orders placed or performed during the hospital encounter of 10/12/21 (from the past 48 hour(s))  Comprehensive metabolic panel     Status: Abnormal   Collection Time: 10/12/21  2:28 PM  Result Value Ref Range   Sodium 134 (L) 135 - 145 mmol/L   Potassium 2.7 (LL) 3.5 - 5.1 mmol/L    Comment: CRITICAL RESULT CALLED TO, READ BACK BY AND VERIFIED WITH: Azzure Garabedian,B ON 10/12/21 AT 1500 BY LOY,C    Chloride 92 (L) 98 - 111 mmol/L   CO2 27 22 - 32  mmol/L   Glucose, Bld 155 (H) 70 - 99 mg/dL    Comment: Glucose reference range applies only to samples taken after fasting for at least 8 hours.   BUN 23 8 - 23 mg/dL   Creatinine, Ser 1.61 (H) 0.61 - 1.24 mg/dL   Calcium 8.2 (L) 8.9 - 10.3 mg/dL   Total Protein 6.3 (L) 6.5 - 8.1 g/dL   Albumin 3.6 3.5 - 5.0 g/dL   AST 25 15 - 41 U/L   ALT 10 0 - 44 U/L   Alkaline Phosphatase 48 38 - 126 U/L   Total Bilirubin 0.4 0.3 - 1.2 mg/dL   GFR, Estimated 18 (L) >60 mL/min    Comment: (NOTE) Calculated using the CKD-EPI Creatinine Equation (2021)    Anion gap  15 5 - 15    Comment: Performed at [facility], [address], [place], [place] [address]  CBC with Differential     Status: Abnormal   Collection Time: 10/12/21  2:28 PM  Result Value Ref Range   WBC 4.6 4.0 - 10.5 K/uL   RBC 3.34 (L) 4.22 - 5.81 MIL/uL   Hemoglobin 10.4 (L) 13.0 - 17.0 g/dL   HCT 54.0 (L) 98.1 - 19.1 %   MCV 93.7 80.0 - 100.0 fL   MCH 31.1 26.0 - 34.0 pg   MCHC 33.2 30.0 - 36.0 g/dL   RDW 47.8 (H) 29.5 - 62.1 %   Platelets 184 150 - 400 K/uL   nRBC 0.0 0.0 - 0.2 %   Neutrophils Relative % 69 %   Neutro Abs 3.2 1.7 - 7.7 K/uL   Lymphocytes Relative 24 %   Lymphs Abs 1.1 0.7 - 4.0 K/uL   Monocytes Relative 7 %   Monocytes Absolute 0.3 0.1 - 1.0 K/uL   Eosinophils Relative 0 %   Eosinophils Absolute 0.0 0.0 - 0.5 K/uL   Basophils Relative 0 %   Basophils Absolute 0.0 0.0 - 0.1 K/uL   Immature Granulocytes 0 %   Abs Immature Granulocytes 0.01 0.00 - 0.07 K/uL    Comment: Performed at [facility], [address], [place], [place] [address]  Ethanol     Status: None   Collection Time: 10/12/21  2:28 PM  Result Value Ref Range   Alcohol, Ethyl (B) <10 <10 mg/dL    Comment: (NOTE) Lowest detectable limit for serum alcohol is 10 mg/dL.  For medical purposes only. Performed at [facility], [address] [place], [place] [address]   Magnesium     Status: Abnormal   Collection Time: 10/12/21  2:28 PM  Result Value Ref Range   Magnesium 1.0 (L) 1.7 - 2.4 mg/dL    Comment: Performed at [facility], [address], [place], [place] [address]   CT Head Wo Contrast  Result Date: 10/12/2021 CLINICAL DATA:  Possible seizure.  Alcohol withdrawal. EXAM: CT HEAD WITHOUT CONTRAST TECHNIQUE: Contiguous axial images were obtained from the base of the skull through the vertex without intravenous contrast. RADIATION DOSE REDUCTION: This exam was performed according to the departmental dose-optimization program which includes automated exposure control, adjustment of the mA  and/or kV according to patient size and/or use of iterative reconstruction technique. COMPARISON:  CT head dated January 09, 2021. FINDINGS: Brain: No evidence of acute infarction, hemorrhage, hydrocephalus, extra-axial collection or mass lesion/mass effect. Stable atrophy and mild chronic microvascular ischemic changes. Vascular: No hyperdense vessel or unexpected calcification. Skull: Normal. Negative for fracture or focal lesion. Sinuses/Orbits: No acute finding. Other:  None. IMPRESSION: 1. No acute intracranial abnormality. Electronically Signed   By: Obie Dredge M.D.   On: 10/12/2021 15:04   DG Chest Portable 1 View  Result Date: 10/12/2021 CLINICAL DATA:  Seizures EXAM: PORTABLE CHEST 1 VIEW COMPARISON:  01/10/2021 FINDINGS: Cardiac size is within normal limits. There are no signs of pulmonary edema or focal pulmonary consolidation. Faint subcentimeter nodular density in the left lower lung fields may suggest nipple shadow or pleural plaque or parenchymal nodule such as granuloma or neoplasm. Deformities are seen in the posterior right sixth and seventh ribs, possibly suggesting healing fractures. IMPRESSION: There are no signs of pulmonary edema or focal pulmonary consolidation. Subcentimeter faint nodule in the left lower lung fields may suggest nipple shadow or pleural plaque or parenchymal nodule in the lung fields. Follow-up PA and lateral views of chest with nipple markers may be considered. Deformities seen in the posterior right sixth and seventh ribs suggest possible healing fractures. Electronically Signed   By: Ernie Avena M.D.   On: 10/12/2021 14:45    Pending Labs Unresulted Labs (From admission, onward)     Start     Ordered   10/12/21 1428  Urinalysis, Routine w reflex microscopic  Once,   STAT        10/12/21 1427   Signed and Held  Magnesium  Tomorrow morning,   R        Signed and Held   Signed and Held  Basic metabolic panel  Tomorrow morning,   R        Signed and  Held   Signed and Held  CBC  Tomorrow morning,   R        Signed and Held            Vitals/Pain Today's Vitals   10/12/21 1600 10/12/21 1630 10/12/21 1645 10/12/21 1730  BP: 114/76 124/75 124/75 116/73  Pulse: 93 85 84 80  Resp: 17 19  17   Temp:      TempSrc:      SpO2: 99% 99%  99%  Weight:      Height:      PainSc:        Isolation Precautions No active isolations  Medications Medications  sodium chloride 0.9 % bolus 1,000 mL (0 mLs Intravenous Stopped 10/12/21 1732)  lactated ringers bolus 1,000 mL (1,000 mLs Intravenous New Bag/Given 10/12/21 1904)  potassium chloride SA (KLOR-CON M) CR tablet 40 mEq (40 mEq Oral Given 10/12/21 1558)  potassium chloride 10 mEq in 100 mL IVPB (0 mEq Intravenous Stopped 10/12/21 1732)  magnesium sulfate IVPB 2 g 50 mL (2 g Intravenous New Bag/Given 10/12/21 1857)  thiamine tablet 100 mg (100 mg Oral Given 10/12/21 1638)    Mobility walks with device Moderate fall risk   Focused Assessments    R Recommendations: See Admitting Provider Note  Report given to:   Additional Notes:

## 2021-10-12 NOTE — Assessment & Plan Note (Signed)
Stable

## 2021-10-12 NOTE — Assessment & Plan Note (Addendum)
Creatinine elevated at 3.58 on admission, baseline 0.9-1.0.  Likely prerenal with poor oral intake, vomiting diarrhea x 1 day.  ?-Creatinine back to baseline, can be followed up outpatient ?

## 2021-10-12 NOTE — Assessment & Plan Note (Addendum)
Per daughter, In the setting of alcohol abuse.  He is supposed to be on Keppra but has not been compliant with his medication in months. ?-Resume Keppra 1000 twice daily ?-Resume gabapentin ?

## 2021-10-12 NOTE — ED Notes (Signed)
Pt was bladder scan, pt showed 83m ?

## 2021-10-12 NOTE — H&P (Signed)
?History and Physical  ? ? ?[name] IWL:[id number] DOB: [birth year] DOA: 10/12/2021 ? ?PCP: Caren Macadam, MD  ? ?Patient coming from: Home ? ?I have personally briefly reviewed patient's old medical records in [place] ? ?Chief Complaint: Seizure like activity ? ?HPI: Scott Jackson is a 71 y.o. male with medical history significant for alcohol abuse, alcoholic dementia, COPD, seizure hx, failure to thrive. ?Patient was brought to the ED from home via EMS with reports of an episode of unresponsiveness.  Patient drinks at least half a pint of vodka daily.  His last drink was Friday the 17th, as patient resolved to quit drinking.  But since then he has had poor oral intake.  His 4 episodes of vomiting yesterday, and one episode of loose stool after he ate suspicious meat in his sandwich.  Patient lives with his mother, but right now she is not home as she is sick in the hospital.   ?Patient daughter [name] is at bedside and is primary decision-maker and helps with the history.  She does not live with patient, but she checks on patient.  She reports the last time she checked on patient was the 15th or 16th and patient was intoxicated- so she canceled his doctor's appointment. ?Yesterday patient fell, patient's sister called daughter to check on patient.  Today, daughter reports patient was weak, could not stand, he was not talking to her, she also reported noticing that the right side of his face was twitching and at the same time his left arm was jerking.  She tried unsuccessfully to get patient into a vehicle, then called EMS. ? ?On EMS arrival, per triage notes, patient was hypotensive-systolic 17E.  But daughter tells me -patient was staring blankly, EMS could not get the pulse, blood pressure or O2 sat reading, she reports they did not hear any heartbeat with a stethoscope.  She tells me she put her hand on ice and placed it on patient and patient subsequently woke up saying "gets that off me".  CPR  was not done, or not required.  ?Daughter is asking if patient will qualify for rehab stay.  She reports patient has not been compliant with his medication for at least the past 5 months. ?She reports patient has a history of seizure-  generalized seizure, that was attributed to alcohol abuse.  ? ?Daughter reports these episodes of patient not responding has occurred several times in the past 2 years.  He has been seeing a neurologist for this. ? ?1 L bolus given by EMS. ? ?ED Course: 97.5.  Heart rate 70s to 93.  Respiratory rate 17-24.  Blood pressure systolic 08/14 on arrival improved to 160/73 after fluids given in ED. ?Potassium 2.7.  Magnesium 1.  Sodium 134.  Creatinine elevated 3.58.  Blood alcohol level less than 10.  Chest x-ray shows healing fractures, chest x-ray recommended to rule out faint pulmonary nodule. ?2 L bolus given in ED, K and mag supplementation started.  Hospitalist to admit. ? ?Review of Systems: As per HPI all other systems reviewed and negative. ? ?Past Medical History:  ?Diagnosis Date  ? ETOH abuse   ? Gastritis and duodenitis   ? Hypertension   ? Pulmonary nodule   ? Tobacco abuse   ? ? ?Past Surgical History:  ?Procedure Laterality Date  ? ANKLE FRACTURE SURGERY    ? APPENDECTOMY    ? BIOPSY  01/08/2019  ? Procedure: BIOPSY;  Surgeon: Danie Binder, MD;  [facility]  [facility];  Service: Endoscopy;;  gastric  ? COLONOSCOPY  01/2015  ? Dr. Posey Pronto: two 2-53m rectal polyps removed, hyperplastic  ? COLONOSCOPY  2007  ? Dr. PPosey Pronto three adenomas removed measuring 4, 10, 16m diverticulosis  ? COLONOSCOPY WITH PROPOFOL N/A 01/08/2019  ? Dr. FiOneida AlarSevere diverticulosis with fixed rectosigmoid region, rectal bleeding likely due to internal hemorrhoids.  [facility] to [facility]osigmoid colon (patient never followed through).  ? ESOPHAGOGASTRODUODENOSCOPY (EGD) WITH PROPOFOL N/A 01/08/2019  ? Dr. FiOneida AlarLow-grade  narrowing Schatzki ring at the GE junction, medium sized hiatal hernia, erosive gastritis/duodenitis in the setting of NSAID and alcohol use.  Gastric biopsy showed gastropathy but no H. pylori  ? intestininal blockage  1970  ? ? ? reports that he has been smoking cigarettes. He has a 12.50 pack-year smoking history. He has never used smokeless tobacco. He reports current alcohol use. He reports that he does not use drugs. ? ?No Known Allergies ? ?Family History  ?Problem Relation Age of Onset  ? Hypertension Mother   ? Stroke Mother   ? Fibromyalgia Daughter   ? Lupus Daughter   ? Anxiety disorder Daughter   ? Post-traumatic stress disorder Daughter   ? Asthma Daughter   ? Breast cancer Sister   ? Breast cancer Sister   ? Colon cancer Neg Hx   ? ? ?Prior to Admission medications   ?Medication Sig Start Date End Date Taking? Authorizing Provider  ?acetaminophen (TYLENOL) 325 MG tablet Take 2 tablets (650 mg total) by mouth every 6 (six) hours as needed for mild pain (or Fever >/= 101). 01/10/21  Yes Gwin Eagon, Courage, MD  ?albuterol (VENTOLIN HFA) 108 (90 Base) MCG/ACT inhaler Inhale 2 puffs into the lungs every 4 (four) hours as needed for wheezing or shortness of breath. 01/10/21  Yes Constance Whittle, Courage, MD  ?amLODipine (NORVASC) 10 MG tablet Take 1 tablet (10 mg total) by mouth daily. 01/10/21  Yes Pierre Cumpton, Courage, MD  ?diclofenac Sodium (VOLTAREN) 1 % GEL See admin instructions. ?Patient not taking: Reported on 10/12/2021 12/27/19   [provider]  ?fluticasone furoate-vilanterol (BREO ELLIPTA) 200-25 MCG/INH AEPB Inhale 1 puff into the lungs daily. ?Patient taking differently: Inhale 1 puff into the lungs daily. As needed 01/11/21   EmRoxan HockeyMD  ?folic acid (FOLVITE) 1 MG tablet Take 1 tablet (1 mg total) by mouth daily. 01/11/21   EmRoxan HockeyMD  ?gabapentin (NEURONTIN) 300 MG capsule 1 capsule    [provider]  ?hydrALAZINE (APRESOLINE) 50 MG tablet Take 1 tablet (50 mg total) by  mouth 3 (three) times daily. 01/10/21   EmRoxan HockeyMD  ?hydrALAZINE (APRESOLINE) 50 MG tablet 1 tablet with food    [provider]  ?isosorbide mononitrate (IMDUR) 30 MG 24 hr tablet Take 1 tablet (30 mg total) by mouth daily. 01/10/21   EmRoxan HockeyMD  ?lactulose (CHRONULAC) 10 GM/15ML solution Take 30 mLs (20 g total) by mouth daily. To have 2-3 soft stools daily to keep ammonia down and stay mentally clear. 01/10/21   EmRoxan HockeyMD  ?levETIRAcetam (KEPPRA) 1000 MG tablet 1 tablet    [provider]  ?levETIRAcetam (KEPPRA) 500 MG tablet Take 2 tablets (1,000 mg total) by mouth 2 (two) times daily. 09/28/21   Dohmeier, CaAsencion PartridgeMD  ?lisinopril-hydrochlorothiazide (ZESTORETIC) 20-12.5 MG tablet Take 1 tablet by mouth every morning. 01/10/21   EmRoxan HockeyMD  ?Magnesium Oxide 420 MG TABS  1 tablet    [provider]  ?Multiple Vitamin (MULTIVITAMIN WITH MINERALS) TABS tablet Take 1 tablet by mouth daily. 01/11/21   Roxan Hockey, MD  ?pantoprazole (PROTONIX) 40 MG tablet Take 1 tablet (40 mg total) by mouth daily before breakfast. 05/27/21 05/27/22  Erenest Rasher, PA-C  ?PARoxetine (PAXIL) 40 MG tablet Take 1 tablet (40 mg total) by mouth every morning. 01/10/21   Roxan Hockey, MD  ?tamsulosin (FLOMAX) 0.4 MG CAPS capsule Take 1 capsule (0.4 mg total) by mouth daily. 01/10/21   Roxan Hockey, MD  ?thiamine 100 MG tablet Take 1 tablet (100 mg total) by mouth daily. 01/11/21   Roxan Hockey, MD  ? ? ?Physical Exam: ?Vitals:  ? 10/12/21 1530 10/12/21 1555 10/12/21 1600 10/12/21 1630  ?BP: 98/73 106/77 114/76 124/75  ?Pulse: 73 85 93 85  ?Resp: (!) 24 (!) '23 17 19  '$ ?Temp:      ?TempSrc:      ?SpO2: 99% 100% 99% 99%  ?Weight:      ?Height:      ? ? ?Constitutional: NAD, calm, comfortable ?Vitals:  ? 10/12/21 1530 10/12/21 1555 10/12/21 1600 10/12/21 1630  ?BP: 98/73 106/77 114/76 124/75  ?Pulse: 73 85 93 85  ?Resp: (!) 24 (!) '23 17 19  '$ ?Temp:      ?TempSrc:       ?SpO2: 99% 100% 99% 99%  ?Weight:      ?Height:      ? ?Eyes: PERRL, lids and conjunctivae normal ?ENMT: Mucous membranes are dry  ?Neck: normal, supple, no masses, no thyromegaly ?Respiratory: clear to ausc

## 2021-10-12 NOTE — ED Provider Notes (Addendum)
?[place] ?Provider Note ? ? ?CSN: [id number] ?Arrival date & time: 10/12/21  1359 ? ?  ? ?History ? ?Chief Complaint  ?Patient presents with  ? Delirium Tremens (DTS)  ? ? ?Scott Jackson is a 71 y.o. male. ? ?HPI ?71 year old male presents with a possible seizure.  Patient drinks about a half a pint of alcohol a day but stopped about 4 days ago because he wanted to quit cold [place].  He also stopped smoking.  Family reported to EMS that he is not been eating and drinking well. ? ?Further history from daughter over the phone indicates he recently binged alcohol, and stopped when his mom was hospitalized. He's been getting weak in the knees and probably falling, and that happened today. He had an "episode" where he was seemingly unresponsive, but no definitive seizure like activity. This has happened to him before.  ? ?Home Medications ?Prior to Admission medications   ?Medication Sig Start Date End Date Taking? Authorizing Provider  ?acetaminophen (TYLENOL) 325 MG tablet Take 2 tablets (650 mg total) by mouth every 6 (six) hours as needed for mild pain (or Fever >/= 101). 01/10/21  Yes Emokpae, Courage, MD  ?albuterol (VENTOLIN HFA) 108 (90 Base) MCG/ACT inhaler Inhale 2 puffs into the lungs every 4 (four) hours as needed for wheezing or shortness of breath. 01/10/21  Yes Emokpae, Courage, MD  ?amLODipine (NORVASC) 10 MG tablet Take 1 tablet (10 mg total) by mouth daily. 01/10/21  Yes Emokpae, Courage, MD  ?diclofenac Sodium (VOLTAREN) 1 % GEL See admin instructions. ?Patient not taking: Reported on 10/12/2021 12/27/19   [provider]  ?fluticasone furoate-vilanterol (BREO ELLIPTA) 200-25 MCG/INH AEPB Inhale 1 puff into the lungs daily. ?Patient taking differently: Inhale 1 puff into the lungs daily. As needed 01/11/21   Roxan Hockey, MD  ?folic acid (FOLVITE) 1 MG tablet Take 1 tablet (1 mg total) by mouth daily. 01/11/21   Roxan Hockey, MD  ?gabapentin (NEURONTIN) 300 MG capsule 1  capsule    [provider]  ?hydrALAZINE (APRESOLINE) 50 MG tablet Take 1 tablet (50 mg total) by mouth 3 (three) times daily. 01/10/21   Roxan Hockey, MD  ?hydrALAZINE (APRESOLINE) 50 MG tablet 1 tablet with food    [provider]  ?isosorbide mononitrate (IMDUR) 30 MG 24 hr tablet Take 1 tablet (30 mg total) by mouth daily. 01/10/21   Roxan Hockey, MD  ?lactulose (CHRONULAC) 10 GM/15ML solution Take 30 mLs (20 g total) by mouth daily. To have 2-3 soft stools daily to keep ammonia down and stay mentally clear. 01/10/21   Roxan Hockey, MD  ?levETIRAcetam (KEPPRA) 1000 MG tablet 1 tablet    [provider]  ?levETIRAcetam (KEPPRA) 500 MG tablet Take 2 tablets (1,000 mg total) by mouth 2 (two) times daily. 09/28/21   Dohmeier, Asencion Partridge, MD  ?lisinopril-hydrochlorothiazide (ZESTORETIC) 20-12.5 MG tablet Take 1 tablet by mouth every morning. 01/10/21   Roxan Hockey, MD  ?Magnesium Oxide 420 MG TABS 1 tablet    [provider]  ?Multiple Vitamin (MULTIVITAMIN WITH MINERALS) TABS tablet Take 1 tablet by mouth daily. 01/11/21   Roxan Hockey, MD  ?pantoprazole (PROTONIX) 40 MG tablet Take 1 tablet (40 mg total) by mouth daily before breakfast. 05/27/21 05/27/22  Erenest Rasher, PA-C  ?PARoxetine (PAXIL) 40 MG tablet Take 1 tablet (40 mg total) by mouth every morning. 01/10/21   Roxan Hockey, MD  ?tamsulosin (FLOMAX) 0.4 MG CAPS capsule Take 1 capsule (0.4 mg total)  by mouth daily. 01/10/21   Roxan Hockey, MD  ?thiamine 100 MG tablet Take 1 tablet (100 mg total) by mouth daily. 01/11/21   Roxan Hockey, MD  ?   ? ?Allergies    ?Patient has no known allergies.   ? ?Review of Systems   ?Review of Systems  ?Constitutional:  Negative for fever.  ?Cardiovascular:  Negative for chest pain.  ?Gastrointestinal:  Negative for abdominal pain.  ?Neurological:  Positive for syncope and weakness. Negative for headaches.  ? ?Physical Exam ?Updated Vital Signs ?BP 124/75   Pulse 84    Temp (!) 97.5 ?F (36.4 ?C) (Oral)   Resp 19   Ht '6\' 1"'$  (1.854 m)   Wt 62.6 kg   SpO2 99%   BMI 18.21 kg/m?  ?Physical Exam ?Vitals and nursing note reviewed.  ?Constitutional:   ?   General: He is not in acute distress. ?   Appearance: He is well-developed. He is not ill-appearing or diaphoretic.  ?HENT:  ?   Head: Normocephalic and atraumatic.  ?Eyes:  ?   Extraocular Movements: Extraocular movements intact.  ?   Pupils: Pupils are equal, round, and reactive to light.  ?Cardiovascular:  ?   Rate and Rhythm: Normal rate and regular rhythm.  ?   Heart sounds: Normal heart sounds.  ?Pulmonary:  ?   Effort: Pulmonary effort is normal.  ?   Breath sounds: Normal breath sounds.  ?Abdominal:  ?   Palpations: Abdomen is soft.  ?   Tenderness: There is no abdominal tenderness.  ?Skin: ?   General: Skin is warm and dry.  ?Neurological:  ?   Mental Status: He is alert and oriented to person, place, and time.  ?   Comments: CN 3-12 grossly intact. 5/5 strength in all 4 extremities. Grossly normal sensation. Normal finger to nose.   ? ? ?ED Results / Procedures / Treatments   ?Labs ?(all labs ordered are listed, but only abnormal results are displayed) ?Labs Reviewed  ?COMPREHENSIVE METABOLIC PANEL - Abnormal; Notable for the following components:  ?    Result Value  ? Sodium 134 (*)   ? Potassium 2.7 (*)   ? Chloride 92 (*)   ? Glucose, Bld 155 (*)   ? Creatinine, Ser 3.58 (*)   ? Calcium 8.2 (*)   ? Total Protein 6.3 (*)   ? GFR, Estimated 18 (*)   ? All other components within normal limits  ?CBC WITH DIFFERENTIAL/PLATELET - Abnormal; Notable for the following components:  ? RBC 3.34 (*)   ? Hemoglobin 10.4 (*)   ? HCT 31.3 (*)   ? RDW 16.6 (*)   ? All other components within normal limits  ?MAGNESIUM - Abnormal; Notable for the following components:  ? Magnesium 1.0 (*)   ? All other components within normal limits  ?ETHANOL  ?URINALYSIS, ROUTINE W REFLEX MICROSCOPIC  ? ? ?EKG ?None ? ?Radiology ?CT Head Wo  Contrast ? ?Result Date: 10/12/2021 ?CLINICAL DATA:  Possible seizure.  Alcohol withdrawal. EXAM: CT HEAD WITHOUT CONTRAST TECHNIQUE: Contiguous axial images were obtained from the base of the skull through the vertex without intravenous contrast. RADIATION DOSE REDUCTION: This exam was performed according to the departmental dose-optimization program which includes automated exposure control, adjustment of the mA and/or kV according to patient size and/or use of iterative reconstruction technique. COMPARISON:  CT head dated January 09, 2021. FINDINGS: Brain: No evidence of acute infarction, hemorrhage, hydrocephalus, extra-axial collection or mass lesion/mass effect. Stable atrophy  and mild chronic microvascular ischemic changes. Vascular: No hyperdense vessel or unexpected calcification. Skull: Normal. Negative for fracture or focal lesion. Sinuses/Orbits: No acute finding. Other: None. IMPRESSION: 1. No acute intracranial abnormality. Electronically Signed   By: Titus Dubin M.D.   On: 10/12/2021 15:04  ? ?DG Chest Portable 1 View ? ?Result Date: 10/12/2021 ?CLINICAL DATA:  Seizures EXAM: PORTABLE CHEST 1 VIEW COMPARISON:  01/10/2021 FINDINGS: Cardiac size is within normal limits. There are no signs of pulmonary edema or focal pulmonary consolidation. Faint subcentimeter nodular density in the left lower lung fields may suggest nipple shadow or pleural plaque or parenchymal nodule such as granuloma or neoplasm. Deformities are seen in the posterior right sixth and seventh ribs, possibly suggesting healing fractures. IMPRESSION: There are no signs of pulmonary edema or focal pulmonary consolidation. Subcentimeter faint nodule in the left lower lung fields may suggest nipple shadow or pleural plaque or parenchymal nodule in the lung fields. Follow-up PA and lateral views of chest with nipple markers may be considered. Deformities seen in the posterior right sixth and seventh ribs suggest possible healing fractures.  Electronically Signed   By: Elmer Picker M.D.   On: 10/12/2021 14:45   ? ?Procedures ?Mar[facility] KitchenCritical Care ?Performed by: Sherwood Gambler, MD ?Authorized by: Sherwood Gambler, MD  ? ?Critical care provider statement:

## 2021-10-12 NOTE — Assessment & Plan Note (Addendum)
Episode of unresponsive, reports of twitching of the right face and jerking of the left arm.  History of seizure activity in the setting of alcohol abuse.  Patient reports his last drink of alcohol was 17th, 4 days ago.  Possible etiologies-hypotension and dehydration, seizure activity and possible postictal state, related to alcohol withdrawal.  But he also has marked left electrolyte abnormalities with potassium of 2.7 and magnesium of 1 putting him at risk for arrhythmias.  Head CT without acute abnormality.  Patient fell yesterday, reports of baseline dementia. ?-No further issues with delirium or confusion noted and patient is back to baseline.  PT recommending SNF on discharge. ? ?

## 2021-10-12 NOTE — Assessment & Plan Note (Addendum)
At baseline drinks half a pint of vodka daily. ?-Noted to have some mild delirium and alcohol withdrawal symptoms during the stay which have now resolved ?-Counseled on cessation and will be given Librium as needed for further withdrawal symptoms ?

## 2021-10-13 DIAGNOSIS — N179 Acute kidney failure, unspecified: Secondary | ICD-10-CM | POA: Diagnosis not present

## 2021-10-13 LAB — CBC
HCT: 31.7 % — ABNORMAL LOW (ref 39.0–52.0)
Hemoglobin: 10.3 g/dL — ABNORMAL LOW (ref 13.0–17.0)
MCH: 31.2 pg (ref 26.0–34.0)
MCHC: 32.5 g/dL (ref 30.0–36.0)
MCV: 96.1 fL (ref 80.0–100.0)
Platelets: 181 10*3/uL (ref 150–400)
RBC: 3.3 MIL/uL — ABNORMAL LOW (ref 4.22–5.81)
RDW: 16.9 % — ABNORMAL HIGH (ref 11.5–15.5)
WBC: 3.8 10*3/uL — ABNORMAL LOW (ref 4.0–10.5)
nRBC: 0 % (ref 0.0–0.2)

## 2021-10-13 LAB — BASIC METABOLIC PANEL
Anion gap: 9 (ref 5–15)
BUN: 22 mg/dL (ref 8–23)
CO2: 26 mmol/L (ref 22–32)
Calcium: 8.1 mg/dL — ABNORMAL LOW (ref 8.9–10.3)
Chloride: 102 mmol/L (ref 98–111)
Creatinine, Ser: 1.88 mg/dL — ABNORMAL HIGH (ref 0.61–1.24)
GFR, Estimated: 38 mL/min — ABNORMAL LOW (ref >60)
Glucose, Bld: 95 mg/dL (ref 70–99)
Potassium: 4 mmol/L (ref 3.5–5.1)
Sodium: 137 mmol/L (ref 135–145)

## 2021-10-13 LAB — IRON AND TIBC
Iron: 56 ug/dL (ref 45–182)
Saturation Ratios: 19 % (ref 17.9–39.5)
TIBC: 288 ug/dL (ref 250–450)
UIBC: 232 ug/dL

## 2021-10-13 LAB — FOLATE: Folate: 74.7 ng/mL (ref >5.9)

## 2021-10-13 LAB — FERRITIN: Ferritin: 29 ng/mL (ref 24–336)

## 2021-10-13 LAB — RETICULOCYTES
Immature Retic Fract: 9.8 % (ref 2.3–15.9)
RBC.: 3.32 MIL/uL — ABNORMAL LOW (ref 4.22–5.81)
Retic Count, Absolute: 55.8 10*3/uL (ref 19.0–186.0)
Retic Ct Pct: 1.7 % (ref 0.4–3.1)

## 2021-10-13 LAB — MAGNESIUM: Magnesium: 1.8 mg/dL (ref 1.7–2.4)

## 2021-10-13 LAB — VITAMIN B12: Vitamin B-12: 431 pg/mL (ref 180–914)

## 2021-10-13 MED ORDER — GABAPENTIN 100 MG PO CAPS
200.0000 mg | ORAL_CAPSULE | Freq: Every day | ORAL | Status: DC
Start: 2021-10-13 — End: 2021-10-15
  Administered 2021-10-13 – 2021-10-14 (×2): 200 mg via ORAL
  Filled 2021-10-13 (×2): qty 2

## 2021-10-13 MED ORDER — LACTATED RINGERS IV SOLN: INTRAVENOUS | Status: AC

## 2021-10-13 NOTE — Plan of Care (Signed)
?  Problem: Acute Rehab PT Goals(only PT should resolve) ?Goal: Pt Will Go Supine/Side To Sit ?Outcome: Progressing ?Flowsheets (Taken 10/13/2021 1408) ?Pt will go Supine/Side to Sit: ? Independently ? with modified independence ?Goal: Patient Will Transfer Sit To/From Stand ?Outcome: Progressing ?Flowsheets (Taken 10/13/2021 1408) ?Patient will transfer sit to/from stand: ? Independently ? with modified independence ?Goal: Pt Will Transfer Bed To Chair/Chair To Bed ?Outcome: Progressing ?Flowsheets (Taken 10/13/2021 1408) ?Pt will Transfer Bed to Chair/Chair to Bed: with modified independence ?Goal: Pt Will Ambulate ?Outcome: Progressing ?Flowsheets (Taken 10/13/2021 1408) ?Pt will Ambulate: ? > 125 feet ? with modified independence ?Note: Quad-cane ?  ?2:08 PM, 10/13/21 ?Lonell Grandchild, MPT ?Physical Therapist with [place] ?[facility] ?[phone] office ?[phone] mobile phone ? ?

## 2021-10-13 NOTE — TOC Initial Note (Addendum)
Transition of Care (TOC) - Initial/Assessment Note  ? ? ?Patient Details  ?Name: Scott Jackson ?MRN: [id number] ?Date of Birth: 08-14-1950 ? ?Transition of Care (TOC) CM/SW Contact:    ?Salome Arnt, LCSW ?Phone Number: ?10/13/2021, 2:23 PM ? ?Clinical Narrative:  Pt admitted due to acute kidney injury. TOC received consult for substance abuse counseling. Pt alert and oriented x4 and reports he lives with his mother. He states he is fairly independent with ADLs and ambulates with a cane at baseline. Pt's daughter provides transport to appointments. Discussed substance use with pt. He reports decided he wanted to stop drinking and last drink was several days ago. He was drinking a half pint of vodka daily on average. Pt is unsure what resources he may need, but inpatient and outpatient resource list provided. PT evaluated pt and recommend home health. Discussed with pt who is agreeable with no preference on agency. Referred and accepted by [facility] with [facility]. Pt gave permission for LCSW to discuss d/c plan with his daughter, [name]. LCSW explained recommendation for home health and [name] feels that pt needs SNF. Discussed that for insurance approval, pt would need a SNF recommendation. Pt ambulated 100' with supervision using cane. LCSW shared that pt could consider SNF private pay or ALF private pay. Jasmine asked about notary for HCPOA. LCSW explained that HCPOA is only used when pt lacks capacity, but could still be notarized to have when needed. She feels that pt needs SNF because he is an alcoholic and was not using a cane prior to [place] pandemic. LCSW provided education on eligibility for SNF and that being an alcoholic alone does not qualify someone for SNF. [name] states she will be coming to hospital later today.                ? ? ?Expected Discharge Plan: [place] ?Barriers to Discharge: Continued Medical Work up ? ? ?Patient Goals and CMS Choice ?Patient states their  goals for this hospitalization and ongoing recovery are:: return home ?  ?Choice offered to / list presented to : Patient ? ?Expected Discharge Plan and Services ?Expected Discharge Plan: [place] ?In-house Referral: Clinical Social Work ?  ?Post Acute Care Choice: Home Health ?Living arrangements for the past 2 months: [place] ?                ?  ?  ?  ?  ?  ?HH Arranged: PT ?[place] Agency: [place] ([place]) ?Date HH Agency Contacted: 10/13/21 ?Time [place]: 8889 ?Representative spoke with at [place]: Vaughan Basta ? ?Prior Living Arrangements/Services ?Living arrangements for the past 2 months: [place] ?Lives with:: Parents ?Patient language and need for interpreter reviewed:: Yes ?Do you feel safe going back to the place where you live?: Yes      ?Need for Family Participation in Patient Care: No (Comment) ?  ?Current home services: DME (cane) ?Criminal Activity/Legal Involvement Pertinent to Current Situation/Hospitalization: No - Comment as needed ? ?Activities of Daily Living ?Home Assistive Devices/Equipment: CPAP, Shower chair without back ?ADL Screening (condition at time of admission) ?Patient's cognitive ability adequate to safely complete daily activities?: Yes ?Is the patient deaf or have difficulty hearing?: No ?Does the patient have difficulty seeing, even when wearing glasses/contacts?: No ?Does the patient have difficulty concentrating, remembering, or making decisions?: Yes ?Patient able to express need for assistance with ADLs?: Yes ?Does the patient have difficulty dressing or bathing?:  Yes ?Independently performs ADLs?: Yes (appropriate for developmental age) ("sometimes") ?Does the patient have difficulty walking or climbing stairs?: Yes ?Weakness of Legs: Both ?Weakness of Arms/Hands: Both ? ?Permission Sought/Granted ?Permission sought to share information with : Family Supports ?Permission granted to share information with : Yes,  Verbal Permission Granted ? Share Information with NAME: Jasmine ?   ? Permission granted to share info w Relationship: Daughter ?   ? ?Emotional Assessment ?  ?  ?Affect (typically observed): Appropriate ?Orientation: : Oriented to Self, Oriented to Place, Oriented to  Time, Oriented to Situation ?Alcohol / Substance Use: Alcohol Use ?Psych Involvement: No (comment) ? ?Admission diagnosis:  Hypokalemia [E87.6] ?AKI (acute kidney injury) ([place]) [N17.9] ?Acute kidney injury ([place]) [N17.9] ?Patient Active Problem List  ? Diagnosis Date Noted  ? AKI (acute kidney injury) ([place]) 10/12/2021  ? Acute metabolic encephalopathy 71/69/6789  ? Electrolyte abnormality 10/12/2021  ? Prolonged QT interval 10/12/2021  ? Encephalopathy chronic 09/28/2021  ? Nausea and vomiting 05/20/2021  ? Increased ammonia level 05/20/2021  ? History of gastritis 05/20/2021  ? Syncope 01/09/2021  ? Tobacco abuse 01/09/2021  ? Alcohol dependence with other alcohol-induced disorder ([place]) 05/07/2020  ? Seizure disorder ([place]) 05/07/2020  ? Anorexia nervosa 10/16/2019  ? Loss of weight 05/13/2019  ? Abnormal CT scan, colon 05/13/2019  ? Single seizure ([place]) 04/09/2019  ? Adult failure to thrive 04/09/2019  ? Alcoholism with alcohol dependence ([place]) 03/19/2019  ? Alcoholism associated with dementia ([place]) 03/19/2019  ? COPD/Emphysema of lung ([place]) 03/19/2019  ? Abdominal pain, epigastric   ? Hemorrhoid 09/28/2018  ? Rectal bleeding 09/28/2018  ? Constipation 09/28/2018  ? Screen for sexually transmitted diseases 11/24/2017  ? Hyperlipidemia 11/24/2017  ? HTN, goal below 140/90 11/24/2017  ? Screening for viral disease 11/24/2017  ? Alcohol abuse 11/24/2017  ? Alcohol abuse counseling and surveillance of alcoholic 38/04/1750  ? ?PCP:  Caren Macadam, MD ?Pharmacy:   ?[facility] - [place], [place] - [address] ?[address] ?[place] [address] ?Phone: [phone] Fax: [phone] ? ? ? ? ?Social  Determinants of Health (SDOH) Interventions ?  ? ?Readmission Risk Interventions ?   ? View : No data to display.  ?  ?  ?  ? ? ? ?

## 2021-10-13 NOTE — Evaluation (Signed)
Physical Therapy Evaluation ?Patient Details ?Name: Scott Jackson ?MRN: [id number] ?DOB: [birth year] ?Today's Date: 10/13/2021 ? ?History of Present Illness ? Scott Jackson is a 71 y.o. male with medical history significant for alcohol abuse, alcoholic dementia, COPD, seizure hx, failure to thrive.  Patient was brought to the ED from home via EMS with reports of an episode of unresponsiveness.  Patient drinks at least half a pint of vodka daily.  His last drink was Friday the 17th, as patient resolved to quit drinking.  But since then he has had poor oral intake.  His 4 episodes of vomiting yesterday, and one episode of loose stool after he ate suspicious meat in his sandwich.  Patient lives with his mother, but right now she is not home as she is sick in the hospital.    Patient daughter [name] is at bedside and is primary decision-maker and helps with the history.  She does not live with patient, but she checks on patient.  She reports the last time she checked on patient was the 15th or 16th and patient was intoxicated- so she canceled his doctor's appointment.  Yesterday patient fell, patient's sister called daughter to check on patient.  Today, daughter reports patient was weak, could not stand, he was not talking to her, she also reported noticing that the right side of his face was twitching and at the same time his left arm was jerking.  She tried unsuccessfully to get patient into a vehicle, then called EMS. ?  ?Clinical Impression ? Patient functioning near baseline for functional mobility and gait demonstrating fair/good return for sit to stands, transfers and ambulating in room/hallway without loss of balance.  Patient slightly unsteady with near loss of balance when taking backwards steps, able to recover without hands on assist and tolerated sitting on commode in bathroom to have a BM after therapy - nurse aware.  Patient will benefit from continued skilled physical therapy in hospital and recommended  venue below to increase strength, balance, endurance for safe ADLs and gait.  ?   ?   ? ?Recommendations for follow up therapy are one component of a multi-disciplinary discharge planning process, led by the attending physician.  Recommendations may be updated based on patient status, additional functional criteria and insurance authorization. ? ?Follow Up Recommendations Home health PT ? ?  ?Assistance Recommended at Discharge PRN  ?Patient can return home with the following ? A little help with walking and/or transfers;A little help with bathing/dressing/bathroom;Help with stairs or ramp for entrance;Assistance with cooking/housework ? ?  ?Equipment Recommendations None recommended by PT  ?Recommendations for Other Services ?    ?  ?Functional Status Assessment Patient has had a recent decline in their functional status and demonstrates the ability to make significant improvements in function in a reasonable and predictable amount of time.  ? ?  ?Precautions / Restrictions Precautions ?Precautions: Fall ?Restrictions ?Weight Bearing Restrictions: No  ? ?  ? ?Mobility ? Bed Mobility ?Overal bed mobility: Modified Independent ?  ?  ?  ?  ?  ?  ?  ?  ? ?Transfers ?Overall transfer level: Needs assistance ?Equipment used: Quad cane ?Transfers: Sit to/from Stand, Bed to chair/wheelchair/BSC ?Sit to Stand: Supervision ?  ?Step pivot transfers: Supervision ?  ?  ?  ?General transfer comment: slightly labored movement ?  ? ?Ambulation/Gait ?Ambulation/Gait assistance: Modified independent (Device/Increase time), Supervision ?Gait Distance (Feet): 100 Feet ?Assistive device: Quad cane ?Gait Pattern/deviations: Decreased step length - right, Decreased step  length - left, Decreased stride length, Step-to pattern ?Gait velocity: decreased ?  ?  ?General Gait Details: slightly labored cadence with good return for ambulation in room/hallways without loss of balance with mostly step-to pattern using quad-cane ? ?Stairs ?  ?  ?   ?  ?  ? ?Wheelchair Mobility ?  ? ?Modified Rankin (Stroke Patients Only) ?  ? ?  ? ?Balance Overall balance assessment: Needs assistance ?Sitting-balance support: Feet supported, No upper extremity supported ?Sitting balance-Leahy Scale: Good ?Sitting balance - Comments: seated at EOB ?  ?Standing balance support: During functional activity, Single extremity supported ?Standing balance-Leahy Scale: Fair ?Standing balance comment: fair/good using Quad-cane ?  ?  ?  ?  ?  ?  ?  ?  ?  ?  ?  ?   ? ? ? ?Pertinent Vitals/Pain Pain Assessment ?Pain Assessment: No/denies pain  ? ? ?Home Living Family/patient expects to be discharged to:: Private residence ?Living Arrangements: Parent ?Available Help at Discharge: Family ?Type of Home: House ?Home Access: Ramped entrance ?  ?  ?  ?Home Layout: One level ?Home Equipment: Cane - quad ?   ?  ?Prior Function Prior Level of Function : Independent/Modified Independent ?  ?  ?  ?  ?  ?  ?Mobility Comments: household and short distanced household ambulator using quad-cane ?ADLs Comments: assisted for community ADLs ?  ? ? ?Hand Dominance  ? Dominant Hand: Right ? ?  ?Extremity/Trunk Assessment  ? Upper Extremity Assessment ?Upper Extremity Assessment: Overall WFL for tasks assessed ?  ? ?Lower Extremity Assessment ?Lower Extremity Assessment: Generalized weakness ?  ? ?Cervical / Trunk Assessment ?Cervical / Trunk Assessment: Normal  ?Communication  ? Communication: No difficulties  ?Cognition Arousal/Alertness: Awake/alert ?Behavior During Therapy: [facility] for tasks assessed/performed ?Overall Cognitive Status: Within Functional Limits for tasks assessed ?  ?  ?  ?  ?  ?  ?  ?  ?  ?  ?  ?  ?  ?  ?  ?  ?  ?  ?  ? ?  ?General Comments   ? ?  ?Exercises    ? ?Assessment/Plan  ?  ?PT Assessment Patient needs continued PT services  ?PT Problem List Decreased strength;Decreased activity tolerance;Decreased balance;Decreased mobility ? ?   ?  ?PT Treatment Interventions DME instruction;Gait  training;Stair training;Functional mobility training;Therapeutic activities;Therapeutic exercise;Patient/family education;Balance training   ? ?PT Goals (Current goals can be found in the Care Plan section)  ?Acute Rehab PT Goals ?Patient Stated Goal: return home with family to assist ?PT Goal Formulation: With patient ?Time For Goal Achievement: 10/20/21 ?Potential to Achieve Goals: Good ? ?  ?Frequency Min 3X/week ?  ? ? ?Co-evaluation   ?  ?  ?  ?  ? ? ?  ?AM-PAC PT "6 Clicks" Mobility  ?Outcome Measure Help needed turning from your back to your side while in a flat bed without using bedrails?: None ?Help needed moving from lying on your back to sitting on the side of a flat bed without using bedrails?: None ?Help needed moving to and from a bed to a chair (including a wheelchair)?: A Little ?Help needed standing up from a chair using your arms (e.g., wheelchair or bedside chair)?: A Little ?Help needed to walk in hospital room?: A Little ?Help needed climbing 3-5 steps with a railing? : A Little ?6 Click Score: 20 ? ?  ?End of Session   ?Activity Tolerance: Patient tolerated treatment well;Patient limited by fatigue ?Patient  left: with call bell/phone within reach;Other (comment);with nursing/sitter in room (left in bathroom with nurse supervising) ?Nurse Communication: Mobility status ?PT Visit Diagnosis: Unsteadiness on feet (R26.81);Other abnormalities of gait and mobility (R26.89);Muscle weakness (generalized) (M62.81) ?  ? ?Time: 1015-1040 ?PT Time Calculation (min) (ACUTE ONLY): 25 min ? ? ?Charges:   PT Evaluation ?$PT Eval Moderate Complexity: 1 Mod ?PT Treatments ?$Therapeutic Activity: 23-37 mins ?  ?   ? ? ?2:07 PM, 10/13/21 ?Lonell Grandchild, MPT ?Physical Therapist with [place] ?[facility] ?[phone] office ?[phone] mobile phone ? ? ?

## 2021-10-13 NOTE — Progress Notes (Signed)
?PROGRESS NOTE ? ? ? ?Scott Jackson  OIN:[id number] DOB: 1950-08-30 DOA: 10/12/2021 ?PCP: Caren Macadam, MD ? ? ?Brief Narrative:  ?Scott Jackson is a 71 y.o. male with medical history significant for alcohol abuse, alcoholic dementia, COPD, seizure hx, failure to thrive. Patient was brought to the ED from home via EMS with reports of an episode of unresponsiveness.  He is a heavy drinker and had stopped drinking on 3/17.  Daughter states that he also does not take his home medications on a regular basis over the last 5 months.  This includes seizure medications.  He was admitted with acute metabolic encephalopathy as well as AKI.  His mentation and AKI are improving and PT evaluation is pending today.  ? ? ?Assessment & Plan: ?  ?Principal Problem: ?  AKI (acute kidney injury) ([place]) ?Active Problems: ?  Electrolyte abnormality ?  Alcohol abuse ?  Alcoholism associated with dementia ([place]) ?  COPD/Emphysema of lung ([place]) ?  Seizure disorder ([place]) ?  Tobacco abuse ?  Acute metabolic encephalopathy ?  Prolonged QT interval ? ?Assessment and Plan: ? ? ?AKI (acute kidney injury) (HCC)-improving ?Creatinine elevated at 3.58, baseline 0.9-1.0.  Likely prerenal with poor oral intake, vomiting diarrhea x 1 day. ?- 3 L bolus given between EMS and ED. ?- Continue on LR ?-Repeat a.m. labs ?  ?Acute metabolic encephalopathy-resolved ?Episode of unresponsive, reports of twitching of the right face and jerking of the left arm.  History of seizure activity in the setting of alcohol abuse.  Patient reports his last drink of alcohol was 17th, 4 days ago.  Possible etiologies-hypotension and dehydration, seizure activity and possible postictal state, related to alcohol withdrawal.  But he also has marked left electrolyte abnormalities with potassium of 2.7 and magnesium of 1 putting him at risk for arrhythmias.  Head CT without acute abnormality.  Patient fell yesterday, reports of baseline dementia. ?-PT evaluation ?-Hydrate, replete  electrolytes ?-monitor on telemetry ?  ?  ?Seizure disorder ([place]) ?Per daughter, In the setting of alcohol abuse.  He is supposed to be on Keppra but has not been compliant with his medication in months. ?-Resume Keppra 1000 twice daily ?-He was also placed on gabapentin 100 in the a.m. and 200 p.m. to help reduce alcohol cravings, will continue ?  ?COPD/Emphysema of lung ([place]) ?Stable. ?  ?Alcohol abuse ?At baseline drinks half a pint of vodka daily.  Reports last alcoholic beverage was 4 days ago 3/17.  Reports he is ready to quit drinking alcohol.  He appears calm at this time, with no evidence of withdrawal.  Blood alcohol level less than 10. ?-CIWA as needed and scheduled. ?-Thiamine, folate, multivitamins ?  ? ?DVT prophylaxis:Heparin ?Code Status: Full ?Family Communication: Tried calling daughter with no response ?Disposition Plan:  ?Status is: Inpatient ?Remains inpatient appropriate because: IV medications and fluids. ? ?Consultants:  ?None ? ?Procedures:  ?See below ? ?Antimicrobials:  ?None ? ? ?Subjective: ?Patient seen and evaluated today with no new acute complaints or concerns. No acute concerns or events noted overnight. ? ?Objective: ?Vitals:  ? 10/13/21 0009 10/13/21 0357 10/13/21 0700 10/13/21 0817  ?BP: 115/81 122/83 122/81 125/85  ?Pulse: 78 70 65 71  ?Resp: '18 18 17 19  '$ ?Temp: 97.7 ?F (36.5 ?C) 97.8 ?F (36.6 ?C) 97.6 ?F (36.4 ?C) 97.6 ?F (36.4 ?C)  ?TempSrc: Oral Oral Oral Oral  ?SpO2: 100% 100% 99% 99%  ?Weight:      ?Height:      ? ? ?  Intake/Output Summary (Last 24 hours) at 10/13/2021 1026 ?Last data filed at 10/13/2021 0900 ?Gross per 24 hour  ?Intake 1228.37 ml  ?Output --  ?Net 1228.37 ml  ? ?Filed Weights  ? 10/12/21 1415 10/12/21 2043  ?Weight: 62.6 kg 63.9 kg  ? ? ?Examination: ? ?General exam: Appears calm and comfortable  ?Respiratory system: Clear to auscultation. Respiratory effort normal. ?Cardiovascular system: S1 & S2 heard, RRR.  ?Gastrointestinal system: Abdomen is  soft ?Central nervous system: Alert and awake ?Extremities: No edema ?Skin: No significant lesions noted ?Psychiatry: Flat affect. ? ? ? ?Data Reviewed: I have personally reviewed following labs and imaging studies ? ?CBC: ?Recent Labs  ?Lab 10/12/21 ?1428 10/13/21 ?1505  ?WBC 4.6 3.8*  ?NEUTROABS 3.2  --   ?HGB 10.4* 10.3*  ?HCT 31.3* 31.7*  ?MCV 93.7 96.1  ?PLT 184 181  ? ?Basic Metabolic Panel: ?Recent Labs  ?Lab 10/12/21 ?1428 10/13/21 ?6979  ?NA 134* 137  ?K 2.7* 4.0  ?CL 92* 102  ?CO2 27 26  ?GLUCOSE 155* 95  ?BUN 23 22  ?CREATININE 3.58* 1.88*  ?CALCIUM 8.2* 8.1*  ?MG 1.0* 1.8  ? ?GFR: ?Estimated Creatinine Clearance: 33 mL/min (A) (by C-G formula based on SCr of 1.88 mg/dL (H)). ?Liver Function Tests: ?Recent Labs  ?Lab 10/12/21 ?1428  ?AST 25  ?ALT 10  ?ALKPHOS 48  ?BILITOT 0.4  ?PROT 6.3*  ?ALBUMIN 3.6  ? ?No results for input(s): LIPASE, AMYLASE in the last 168 hours. ?No results for input(s): AMMONIA in the last 168 hours. ?Coagulation Profile: ?No results for input(s): INR, PROTIME in the last 168 hours. ?Cardiac Enzymes: ?No results for input(s): CKTOTAL, CKMB, CKMBINDEX, TROPONINI in the last 168 hours. ?BNP (last 3 results) ?No results for input(s): PROBNP in the last 8760 hours. ?HbA1C: ?No results for input(s): HGBA1C in the last 72 hours. ?CBG: ?No results for input(s): GLUCAP in the last 168 hours. ?Lipid Profile: ?No results for input(s): CHOL, HDL, LDLCALC, TRIG, CHOLHDL, LDLDIRECT in the last 72 hours. ?Thyroid Function Tests: ?No results for input(s): TSH, T4TOTAL, FREET4, T3FREE, THYROIDAB in the last 72 hours. ?Anemia Panel: ?No results for input(s): VITAMINB12, FOLATE, FERRITIN, TIBC, IRON, RETICCTPCT in the last 72 hours. ?Sepsis Labs: ?No results for input(s): PROCALCITON, LATICACIDVEN in the last 168 hours. ? ?No results found for this or any previous visit (from the past 240 hour(s)).  ? ? ? ? ? ?Radiology Studies: ?CT Head Wo Contrast ? ?Result Date: 10/12/2021 ?CLINICAL DATA:   Possible seizure.  Alcohol withdrawal. EXAM: CT HEAD WITHOUT CONTRAST TECHNIQUE: Contiguous axial images were obtained from the base of the skull through the vertex without intravenous contrast. RADIATION DOSE REDUCTION: This exam was performed according to the departmental dose-optimization program which includes automated exposure control, adjustment of the mA and/or kV according to patient size and/or use of iterative reconstruction technique. COMPARISON:  CT head dated January 09, 2021. FINDINGS: Brain: No evidence of acute infarction, hemorrhage, hydrocephalus, extra-axial collection or mass lesion/mass effect. Stable atrophy and mild chronic microvascular ischemic changes. Vascular: No hyperdense vessel or unexpected calcification. Skull: Normal. Negative for fracture or focal lesion. Sinuses/Orbits: No acute finding. Other: None. IMPRESSION: 1. No acute intracranial abnormality. Electronically Signed   By: Titus Dubin M.D.   On: 10/12/2021 15:04  ? ?DG Chest Portable 1 View ? ?Result Date: 10/12/2021 ?CLINICAL DATA:  Seizures EXAM: PORTABLE CHEST 1 VIEW COMPARISON:  01/10/2021 FINDINGS: Cardiac size is within normal limits. There are no signs of pulmonary edema or focal  pulmonary consolidation. Faint subcentimeter nodular density in the left lower lung fields may suggest nipple shadow or pleural plaque or parenchymal nodule such as granuloma or neoplasm. Deformities are seen in the posterior right sixth and seventh ribs, possibly suggesting healing fractures. IMPRESSION: There are no signs of pulmonary edema or focal pulmonary consolidation. Subcentimeter faint nodule in the left lower lung fields may suggest nipple shadow or pleural plaque or parenchymal nodule in the lung fields. Follow-up PA and lateral views of chest with nipple markers may be considered. Deformities seen in the posterior right sixth and seventh ribs suggest possible healing fractures. Electronically Signed   By: Elmer Picker M.D.    On: 10/12/2021 14:45   ? ? ? ? ? ?Scheduled Meds: ? fluticasone furoate-vilanterol  1 puff Inhalation Daily  ? folic acid  1 mg Oral Daily  ? gabapentin  100 mg Oral q morning  ? gabapentin  200 mg Oral Q2200  ? hepa

## 2021-10-13 NOTE — Hospital Course (Addendum)
Scott Jackson is a 71 y.o. male with medical history significant for alcohol abuse, alcoholic dementia, COPD, seizure hx, failure to thrive. Patient was brought to the ED from home via EMS with reports of an episode of unresponsiveness.  He is a heavy drinker and had stopped drinking on 3/17.  Daughter states that he also does not take his home medications on a regular basis over the last 5 months.  This includes seizure medications.  He was admitted with acute metabolic encephalopathy as well as AKI.  His AKI has improved back to baseline.  His mentation has improved back to baseline as well and he is no longer experiencing any issues with delirium.  He has been assessed by PT with recommendations for SNF and he is stable for discharge today. ?

## 2021-10-14 LAB — MAGNESIUM: Magnesium: 1.6 mg/dL — ABNORMAL LOW (ref 1.7–2.4)

## 2021-10-14 LAB — BASIC METABOLIC PANEL
Anion gap: 8 (ref 5–15)
BUN: 19 mg/dL (ref 8–23)
CO2: 24 mmol/L (ref 22–32)
Calcium: 8.7 mg/dL — ABNORMAL LOW (ref 8.9–10.3)
Chloride: 103 mmol/L (ref 98–111)
Creatinine, Ser: 1.44 mg/dL — ABNORMAL HIGH (ref 0.61–1.24)
GFR, Estimated: 52 mL/min — ABNORMAL LOW (ref >60)
Glucose, Bld: 104 mg/dL — ABNORMAL HIGH (ref 70–99)
Potassium: 3.8 mmol/L (ref 3.5–5.1)
Sodium: 135 mmol/L (ref 135–145)

## 2021-10-14 MED ORDER — MAGNESIUM SULFATE 2 GM/50ML IV SOLN
2.0000 g | Freq: Once | INTRAVENOUS | Status: AC
  Administered 2021-10-14: 2 g via INTRAVENOUS
  Filled 2021-10-14: qty 50

## 2021-10-14 MED ORDER — LACTATED RINGERS IV SOLN: INTRAVENOUS | Status: AC

## 2021-10-14 MED ORDER — HYDRALAZINE HCL 20 MG/ML IJ SOLN
10.0000 mg | INTRAMUSCULAR | Status: DC | PRN
Start: 2021-10-14 — End: 2021-10-15
  Administered 2021-10-14: 10 mg via INTRAVENOUS
  Filled 2021-10-14 (×2): qty 1

## 2021-10-14 NOTE — Progress Notes (Incomplete)
Patient repeatedly attempting to get out of bed continuously. Patient at times redirectable, requested 1:1 for patient, physician approved . ?

## 2021-10-14 NOTE — Progress Notes (Signed)
Physical Therapy Treatment ?Patient Details ?Name: Scott Jackson ?MRN: [id number] ?DOB: 1951-02-02 ?Today's Date: 10/14/2021 ? ? ?History of Present Illness Demontray Franta is a 71 y.o. male with medical history significant for alcohol abuse, alcoholic dementia, COPD, seizure hx, failure to thrive.  Patient was brought to the ED from home via EMS with reports of an episode of unresponsiveness.  Patient drinks at least half a pint of vodka daily.  His last drink was Friday the 17th, as patient resolved to quit drinking.  But since then he has had poor oral intake.  His 4 episodes of vomiting yesterday, and one episode of loose stool after he ate suspicious meat in his sandwich.  Patient lives with his mother, but right now she is not home as she is sick in the hospital.    Patient daughter [name] is at bedside and is primary decision-maker and helps with the history.  She does not live with patient, but she checks on patient.  She reports the last time she checked on patient was the 15th or 16th and patient was intoxicated- so she canceled his doctor's appointment.  Yesterday patient fell, patient's sister called daughter to check on patient.  Today, daughter reports patient was weak, could not stand, he was not talking to her, she also reported noticing that the right side of his face was twitching and at the same time his left arm was jerking.  She tried unsuccessfully to get patient into a vehicle, then called EMS. ? ?  ?PT Comments  ? ? Patient presents seated in chair, slightly lethargic and agreeable for therapy.  Patient unable to to maintain standing balance without AD, required use of RW to complete sit to stands due to weakness, unsteady on feet demonstrating slow labored cadence and limited to ambulating to doorway and back to bedside secondary to generalized weakness and fatigue.  Patient tolerated staying up in chair after therapy - nursing staff aware.  Patient will benefit from continued skilled physical  therapy in hospital and recommended venue below to increase strength, balance, endurance for safe ADLs and gait.  ?   ?Recommendations for follow up therapy are one component of a multi-disciplinary discharge planning process, led by the attending physician.  Recommendations may be updated based on patient status, additional functional criteria and insurance authorization. ? ?Follow Up Recommendations ? Skilled nursing-short term rehab (<3 hours/day) ?  ?  ?Assistance Recommended at Discharge Intermittent Supervision/Assistance  ?Patient can return home with the following A lot of help with bathing/dressing/bathroom;A little help with bathing/dressing/bathroom;Assistance with cooking/housework;Help with stairs or ramp for entrance ?  ?Equipment Recommendations ? Rolling walker (2 wheels)  ?  ?Recommendations for Other Services   ? ? ?  ?Precautions / Restrictions Precautions ?Precautions: Fall ?Restrictions ?Weight Bearing Restrictions: No  ?  ? ?Mobility ? Bed Mobility ?Overal bed mobility: Needs Assistance ?Bed Mobility: Supine to Sit, Sit to Supine ?  ?  ?Supine to sit: Min guard ?Sit to supine: Min guard ?  ?General bed mobility comments: increased time, labored movement ?  ? ?Transfers ?Overall transfer level: Needs assistance ?Equipment used: Rolling walker (2 wheels), None ?Transfers: Sit to/from Stand, Bed to chair/wheelchair/BSC ?Sit to Stand: Min assist, Mod assist ?  ?Step pivot transfers: Min assist, Mod assist ?  ?  ?  ?General transfer comment: very unsteady on feet and unable to maintain standing balance, requiring use of RW for safety ?  ? ?Ambulation/Gait ?Ambulation/Gait assistance: Min assist, Mod assist ?Gait Distance (  Feet): 22 Feet ?Assistive device: Rolling walker (2 wheels) ?Gait Pattern/deviations: Decreased step length - right, Decreased step length - left, Decreased stride length ?Gait velocity: decreased ?  ?  ?General Gait Details: slow labored cadence with decreased step/stride length,  increased time with unsteady movement for making turns and limited due to fatigue ? ? ?Stairs ?  ?  ?  ?  ?  ? ? ?Wheelchair Mobility ?  ? ?Modified Rankin (Stroke Patients Only) ?  ? ? ?  ?Balance Overall balance assessment: Needs assistance ?Sitting-balance support: Feet supported, No upper extremity supported ?Sitting balance-Leahy Scale: Fair ?Sitting balance - Comments: fair/good seated at EOB ?  ?Standing balance support: During functional activity, No upper extremity supported ?Standing balance-Leahy Scale: Fair ?Standing balance comment: fair using RW ?  ?  ?  ?  ?  ?  ?  ?  ?  ?  ?  ?  ? ?  ?Cognition Arousal/Alertness: Awake/alert, Lethargic ?Behavior During Therapy: [facility] for tasks assessed/performed ?Overall Cognitive Status: Within Functional Limits for tasks assessed ?  ?  ?  ?  ?  ?  ?  ?  ?  ?  ?  ?  ?  ?  ?  ?  ?  ?  ?  ? ?  ?Exercises   ? ?  ?General Comments   ?  ?  ? ?Pertinent Vitals/Pain Pain Assessment ?Pain Assessment: No/denies pain  ? ? ?Home Living   ?  ?  ?  ?  ?  ?  ?  ?  ?  ?   ?  ?Prior Function    ?  ?  ?   ? ?PT Goals (current goals can now be found in the care plan section) Acute Rehab PT Goals ?Patient Stated Goal: return home with family to assist ?PT Goal Formulation: With patient ?Time For Goal Achievement: 10/27/21 ?Potential to Achieve Goals: Good ?Progress towards PT goals: Progressing toward goals ? ?  ?Frequency ? ? ? Min 3X/week ? ? ? ?  ?PT Plan Discharge plan needs to be updated  ? ? ?Co-evaluation   ?  ?  ?  ?  ? ?  ?AM-PAC PT "6 Clicks" Mobility   ?Outcome Measure ? Help needed turning from your back to your side while in a flat bed without using bedrails?: A Little ?Help needed moving from lying on your back to sitting on the side of a flat bed without using bedrails?: A Little ?Help needed moving to and from a bed to a chair (including a wheelchair)?: A Lot ?Help needed standing up from a chair using your arms (e.g., wheelchair or bedside chair)?: A Little ?Help needed  to walk in hospital room?: A Lot ?Help needed climbing 3-5 steps with a railing? : A Lot ?6 Click Score: 15 ? ?  ?End of Session   ?Activity Tolerance: Patient tolerated treatment well;Patient limited by fatigue ?Patient left: in chair;with call bell/phone within reach;with chair alarm set ?Nurse Communication: Mobility status ?PT Visit Diagnosis: Unsteadiness on feet (R26.81);Other abnormalities of gait and mobility (R26.89);Muscle weakness (generalized) (M62.81) ?  ? ? ?Time: 4967-5916 ?PT Time Calculation (min) (ACUTE ONLY): 30 min ? ?Charges:  $Gait Training: 8-22 mins ?$Therapeutic Activity: 8-22 mins          ?          ? ?12:24 PM, 10/14/21 ?Lonell Grandchild, MPT ?Physical Therapist with [place] ?[facility] ?[phone] office ?[phone] mobile phone ? ? ?

## 2021-10-14 NOTE — TOC Progression Note (Signed)
Transition of Care (TOC) - Progression Note  ? ? ?Patient Details  ?Name: Scott Jackson ?MRN: [id number] ?Date of Birth: 31-Oct-1950 ? ?Transition of Care (TOC) CM/SW Contact  ?Shade Flood, LCSW ?Phone Number: ?10/14/2021, 12:19 PM ? ?Clinical Narrative:    ? ?TOC following. PT now recommending SNF for dc. Pt currently not oriented. Attempted to reach pt's daughter to review CMS provider options. Dtr did not answer and no option to leave a voicemail message. Will attempt again later today. Yesterday, pt's daughter had expressed to [facility] that she was interested in him going for SNF rehab at dc if recommended. TOC will initiate insurance auth today and send out for bed offers as soon as able to speak with dtr. ? ?Expected Discharge Plan: [place] ?Barriers to Discharge: Continued Medical Work up ? ?Expected Discharge Plan and Services ?Expected Discharge Plan: [place] ?In-house Referral: Clinical Social Work ?  ?Post Acute Care Choice: Home Health ?Living arrangements for the past 2 months: [place] ?                ?  ?  ?  ?  ?  ?HH Arranged: PT ?[place] Agency: [facility] ([place]) ?Date HH Agency Contacted: 10/13/21 ?Time [place]: 6962 ?Representative spoke with at [place]: Vaughan Basta ? ? ?Social Determinants of Health (SDOH) Interventions ?  ? ?Readmission Risk Interventions ?   ? View : No data to display.  ?  ?  ?  ? ? ?

## 2021-10-14 NOTE — Progress Notes (Signed)
Patient in bed being redirected repeatedly, able to redirect for safety ?

## 2021-10-14 NOTE — Assessment & Plan Note (Signed)
Counseled on cessation 

## 2021-10-14 NOTE — Progress Notes (Signed)
Patient presented on floor in doorway of room sitting upright on buttocks with knees in upright position. Upon assessment patient was able to move all extremities with active range of motion, no bruising or open areas or other injury noted. Physician contacted, family notified, patient returned to bed with safety precautions in place (low bed, floor mats, bed alarm), urinal at bedside, call bell in place.  ?

## 2021-10-14 NOTE — Progress Notes (Signed)
?PROGRESS NOTE ? ? ? ?Scott Jackson  KWI:[id number] DOB: [birth year] DOA: 10/12/2021 ?PCP: Caren Macadam, MD ? ? ?Brief Narrative:  ?Scott Jackson is a 71 y.o. male with medical history significant for alcohol abuse, alcoholic dementia, COPD, seizure hx, failure to thrive. Patient was brought to the ED from home via EMS with reports of an episode of unresponsiveness.  He is a heavy drinker and had stopped drinking on 3/17.  Daughter states that he also does not take his home medications on a regular basis over the last 5 months.  This includes seizure medications.  He was admitted with acute metabolic encephalopathy as well as AKI.  His AKI is improving, but he remains delirious this a.m. and requires a sitter at bedside.  Plan is for discharge to home once mentation is further improved.  ? ? ?Assessment & Plan: ?  ?Principal Problem: ?  AKI (acute kidney injury) ([place]) ?Active Problems: ?  Electrolyte abnormality ?  Alcohol abuse ?  Alcoholism associated with dementia ([place]) ?  COPD/Emphysema of lung ([place]) ?  Seizure disorder ([place]) ?  Tobacco abuse ?  Acute metabolic encephalopathy ?  Prolonged QT interval ? ?Assessment and Plan: ?* AKI (acute kidney injury) ([place]) ?Creatinine elevated at 3.58 on admission, baseline 0.9-1.0.  Likely prerenal with poor oral intake, vomiting diarrhea x 1 day. Initial hypotension systolic down to 24Q now improved. ?- 3 L bolus given between EMS and ED. ?-Continue IV fluid and monitor with improving creatinine levels noted ? ?Electrolyte abnormality ?Hypomagnesemia will be repleted today, monitor ? ?Prolonged QT interval ?QTc 541.  Likely secondary to hypokalemia and hypomagnesemia.   ?- Replete lytes.. ? ?Acute metabolic encephalopathy ?Episode of unresponsive, reports of twitching of the right face and jerking of the left arm.  History of seizure activity in the setting of alcohol abuse.  Patient reports his last drink of alcohol was 17th, 4 days ago.  Possible etiologies-hypotension and  dehydration, seizure activity and possible postictal state, related to alcohol withdrawal.  But he also has marked left electrolyte abnormalities with potassium of 2.7 and magnesium of 1 putting him at risk for arrhythmias.  Head CT without acute abnormality.  Patient fell yesterday, reports of baseline dementia. ?-PT evaluation with recommendations for home health ?-Currently with some aspect of delirium which will need close monitoring with sitter at bedside ? ? ?Tobacco abuse ?Counseled on cessation ? ?Seizure disorder ([place]) ?Per daughter, In the setting of alcohol abuse.  He is supposed to be on Keppra but has not been compliant with his medication in months. ?-Resume Keppra 1000 twice daily ?-He was also placed on gabapentin 100 in the a.m. and 200 p.m. to help reduce alcohol cravings, will continue ? ?COPD/Emphysema of lung ([place]) ?Stable. ? ?Alcohol abuse ?At baseline drinks half a pint of vodka daily.  Reports last alcoholic beverage was 4 days ago 3/17.  Reports he is ready to quit drinking alcohol.  He appears calm at this time, with no evidence of withdrawal.  Blood alcohol level less than 10. ?-CIWA as needed and scheduled. ?-Thiamine, folate, multivitamins ?-Currently with delirium, CIWA score is low ? ?DVT prophylaxis:Heparin ?Code Status: Full ?Family Communication: Discussed with daughter at bedside on 3/22, tried calling 3/23 with no response ?Disposition Plan:  ?Status is: Inpatient ?Remains inpatient appropriate because: IV medications and fluids. ?  ?Consultants:  ?None ?  ?Procedures:  ?See below ?  ?Antimicrobials:  ?None ?  ? ?Subjective: ?Patient seen and evaluated today with ongoing confusion  and noted agitation overnight.  Requiring sitter at bedside this AM. ? ?Objective: ?Vitals:  ? 10/13/21 1940 10/14/21 0867 10/14/21 0815 10/14/21 6195  ?BP: (!) 136/94 (!) 172/95  (!) 187/103  ?Pulse: 75 67  69  ?Resp: 18 19    ?Temp: 98.3 ?F (36.8 ?C) 98.7 ?F (37.1 ?C)  (!) 97.5 ?F (36.4 ?C)  ?TempSrc:  Oral Oral  Axillary  ?SpO2: 99% 100% 96% 99%  ?Weight:      ?Height:      ? ? ?Intake/Output Summary (Last 24 hours) at 10/14/2021 0945 ?Last data filed at 10/14/2021 0600 ?Gross per 24 hour  ?Intake 997.53 ml  ?Output 700 ml  ?Net 297.53 ml  ? ?Filed Weights  ? 10/12/21 1415 10/12/21 2043  ?Weight: 62.6 kg 63.9 kg  ? ? ?Examination: ? ?General exam: Appears calm and comfortable  ?Respiratory system: Clear to auscultation. Respiratory effort normal. ?Cardiovascular system: S1 & S2 heard, RRR.  ?Gastrointestinal system: Abdomen is soft ?Central nervous system: Confused and minimally agitated ?Extremities: No edema ?Skin: No significant lesions noted ?Psychiatry: Flat affect. ? ? ? ?Data Reviewed: I have personally reviewed following labs and imaging studies ? ?CBC: ?Recent Labs  ?Lab 10/12/21 ?1428 10/13/21 ?0932  ?WBC 4.6 3.8*  ?NEUTROABS 3.2  --   ?HGB 10.4* 10.3*  ?HCT 31.3* 31.7*  ?MCV 93.7 96.1  ?PLT 184 181  ? ?Basic Metabolic Panel: ?Recent Labs  ?Lab 10/12/21 ?1428 10/13/21 ?6712 10/14/21 ?4580  ?NA 134* 137 135  ?K 2.7* 4.0 3.8  ?CL 92* 102 103  ?CO2 '27 26 24  '$ ?GLUCOSE 155* 95 104*  ?BUN '23 22 19  '$ ?CREATININE 3.58* 1.88* 1.44*  ?CALCIUM 8.2* 8.1* 8.7*  ?MG 1.0* 1.8 1.6*  ? ?GFR: ?Estimated Creatinine Clearance: 43.1 mL/min (A) (by C-G formula based on SCr of 1.44 mg/dL (H)). ?Liver Function Tests: ?Recent Labs  ?Lab 10/12/21 ?1428  ?AST 25  ?ALT 10  ?ALKPHOS 48  ?BILITOT 0.4  ?PROT 6.3*  ?ALBUMIN 3.6  ? ?No results for input(s): LIPASE, AMYLASE in the last 168 hours. ?No results for input(s): AMMONIA in the last 168 hours. ?Coagulation Profile: ?No results for input(s): INR, PROTIME in the last 168 hours. ?Cardiac Enzymes: ?No results for input(s): CKTOTAL, CKMB, CKMBINDEX, TROPONINI in the last 168 hours. ?BNP (last 3 results) ?No results for input(s): PROBNP in the last 8760 hours. ?HbA1C: ?No results for input(s): HGBA1C in the last 72 hours. ?CBG: ?No results for input(s): GLUCAP in the last 168  hours. ?Lipid Profile: ?No results for input(s): CHOL, HDL, LDLCALC, TRIG, CHOLHDL, LDLDIRECT in the last 72 hours. ?Thyroid Function Tests: ?No results for input(s): TSH, T4TOTAL, FREET4, T3FREE, THYROIDAB in the last 72 hours. ?Anemia Panel: ?Recent Labs  ?  10/13/21 ?9983  ?VITAMINB12 431  ?FOLATE 74.7  ?FERRITIN 29  ?TIBC 288  ?IRON 56  ?RETICCTPCT 1.7  ? ?Sepsis Labs: ?No results for input(s): PROCALCITON, LATICACIDVEN in the last 168 hours. ? ?No results found for this or any previous visit (from the past 240 hour(s)).  ? ? ? ? ? ?Radiology Studies: ?CT Head Wo Contrast ? ?Result Date: 10/12/2021 ?CLINICAL DATA:  Possible seizure.  Alcohol withdrawal. EXAM: CT HEAD WITHOUT CONTRAST TECHNIQUE: Contiguous axial images were obtained from the base of the skull through the vertex without intravenous contrast. RADIATION DOSE REDUCTION: This exam was performed according to the departmental dose-optimization program which includes automated exposure control, adjustment of the mA and/or kV according to patient size and/or use of iterative  reconstruction technique. COMPARISON:  CT head dated January 09, 2021. FINDINGS: Brain: No evidence of acute infarction, hemorrhage, hydrocephalus, extra-axial collection or mass lesion/mass effect. Stable atrophy and mild chronic microvascular ischemic changes. Vascular: No hyperdense vessel or unexpected calcification. Skull: Normal. Negative for fracture or focal lesion. Sinuses/Orbits: No acute finding. Other: None. IMPRESSION: 1. No acute intracranial abnormality. Electronically Signed   By: Titus Dubin M.D.   On: 10/12/2021 15:04  ? ?DG Chest Portable 1 View ? ?Result Date: 10/12/2021 ?CLINICAL DATA:  Seizures EXAM: PORTABLE CHEST 1 VIEW COMPARISON:  01/10/2021 FINDINGS: Cardiac size is within normal limits. There are no signs of pulmonary edema or focal pulmonary consolidation. Faint subcentimeter nodular density in the left lower lung fields may suggest nipple shadow or pleural  plaque or parenchymal nodule such as granuloma or neoplasm. Deformities are seen in the posterior right sixth and seventh ribs, possibly suggesting healing fractures. IMPRESSION: There are no signs of pulmona

## 2021-10-14 NOTE — NC FL2 (Signed)
?[place] MEDICAID FL2 LEVEL OF CARE SCREENING TOOL  ?  ? ?IDENTIFICATION  ?Patient Name: ?Scott Jackson: 10/09/1950 Sex: male Admission Date (Current Location): ?10/12/2021  ?[place] and [place] Number: ? [place] and Address:  ?[facility] [address] [place] ?     Provider Number: ?[id number]  ?Attending Physician Name and Address:  ?Manuella Ghazi, Pratik D, DO ? Relative Name and Phone Number:  ?  ?   ?Current Level of Care: ?Hospital Recommended Level of Care: ?[place] Prior Approval Number: ?  ? ?Date Approved/Denied: ?  PASRR Number: ?[id number] A ? ?Discharge Plan: ?SNF ?  ? ?Current Diagnoses: ?Patient Active Problem List  ? Diagnosis Date Noted  ? AKI (acute kidney injury) ([place]) 10/12/2021  ? Acute metabolic encephalopathy 23/55/7322  ? Electrolyte abnormality 10/12/2021  ? Prolonged QT interval 10/12/2021  ? Encephalopathy chronic 09/28/2021  ? Nausea and vomiting 05/20/2021  ? Increased ammonia level 05/20/2021  ? History of gastritis 05/20/2021  ? Syncope 01/09/2021  ? Tobacco abuse 01/09/2021  ? Alcohol dependence with other alcohol-induced disorder ([place]) 05/07/2020  ? Seizure disorder ([place]) 05/07/2020  ? Anorexia nervosa 10/16/2019  ? Loss of weight 05/13/2019  ? Abnormal CT scan, colon 05/13/2019  ? Single seizure ([place]) 04/09/2019  ? Adult failure to thrive 04/09/2019  ? Alcoholism with alcohol dependence ([place]) 03/19/2019  ? Alcoholism associated with dementia ([place]) 03/19/2019  ? COPD/Emphysema of lung ([place]) 03/19/2019  ? Abdominal pain, epigastric   ? Hemorrhoid 09/28/2018  ? Rectal bleeding 09/28/2018  ? Constipation 09/28/2018  ? Screen for sexually transmitted diseases 11/24/2017  ? Hyperlipidemia 11/24/2017  ? HTN, goal below 140/90 11/24/2017  ? Screening for viral disease 11/24/2017  ? Alcohol abuse 11/24/2017  ? Alcohol abuse counseling and surveillance of alcoholic 02/54/2706  ? ? ?Orientation RESPIRATION BLADDER Height & Weight    ?  ?Self, Place ? Normal Incontinent Weight: 140 lb 14 oz (63.9 kg) ?Height:  '6\' 2"'$  (188 cm)  ?BEHAVIORAL SYMPTOMS/MOOD NEUROLOGICAL BOWEL NUTRITION STATUS  ?    Continent Diet (see dc summary)  ?AMBULATORY STATUS COMMUNICATION OF NEEDS Skin   ?Extensive Assist Verbally Normal ?  ?  ?  ?    ?     ?     ? ? ?Personal Care Assistance Level of Assistance  ?Bathing, Feeding, Dressing Bathing Assistance: Limited assistance ?Feeding assistance: Independent ?Dressing Assistance: Limited assistance ?   ? ?Functional Limitations Info  ?Sight, Hearing, Speech Sight Info: Adequate ?Hearing Info: Adequate ?Speech Info: Adequate  ? ? ?SPECIAL CARE FACTORS FREQUENCY  ?PT (By licensed PT), OT (By licensed OT)   ?  ?PT Frequency: 5x week ?OT Frequency: 3x week ?  ?  ?  ?   ? ? ?Contractures Contractures Info: Not present  ? ? ?Additional Factors Info  ?Code Status, Allergies Code Status Info: Full ?Allergies Info: NKA ?  ?  ?  ?   ? ?Current Medications (10/14/2021):  This is the current hospital active medication list ?Current Facility-Administered Medications  ?Medication Dose Route Frequency Provider Last Rate Last Admin  ? acetaminophen (TYLENOL) tablet 650 mg  650 mg Oral Q6H PRN Emokpae, Ejiroghene E, MD      ? Or  ? acetaminophen (TYLENOL) suppository 650 mg  650 mg Rectal Q6H PRN Emokpae, Ejiroghene E, MD      ? albuterol (PROVENTIL) (2.5 MG/3ML) 0.083% nebulizer solution 2.5 mg  2.5 mg Nebulization Q4H PRN Emokpae, Ejiroghene E, MD      ?  fluticasone furoate-vilanterol (BREO ELLIPTA) 200-25 MCG/ACT 1 puff  1 puff Inhalation Daily Emokpae, Ejiroghene E, MD   1 puff at 10/14/21 0811  ? folic acid (FOLVITE) tablet 1 mg  1 mg Oral Daily Emokpae, Ejiroghene E, MD   1 mg at 10/14/21 4967  ? gabapentin (NEURONTIN) capsule 100 mg  100 mg Oral q morning Emokpae, Ejiroghene E, MD   100 mg at 10/14/21 1051  ? gabapentin (NEURONTIN) capsule 200 mg  200 mg Oral QHS Shah, Pratik D, DO   200 mg at 10/13/21 2148  ? heparin injection 5,000  Units  5,000 Units Subcutaneous Q8H Emokpae, Ejiroghene E, MD   5,000 Units at 10/13/21 2151  ? hydrALAZINE (APRESOLINE) injection 10 mg  10 mg Intravenous Q4H PRN Manuella Ghazi, Pratik D, DO      ? lactated ringers infusion   Intravenous Continuous Heath Lark D, DO 75 mL/hr at 10/14/21 1038 New Bag at 10/14/21 1038  ? levETIRAcetam (KEPPRA) tablet 1,000 mg  1,000 mg Oral BID Emokpae, Ejiroghene E, MD   1,000 mg at 10/14/21 5916  ? LORazepam (ATIVAN) injection 0-4 mg  0-4 mg Intravenous Q6H Emokpae, Ejiroghene E, MD   2 mg at 10/13/21 2149  ? Followed by  ? LORazepam (ATIVAN) injection 0-4 mg  0-4 mg Intravenous Q12H Emokpae, Ejiroghene E, MD      ? LORazepam (ATIVAN) tablet 1-4 mg  1-4 mg Oral Q1H PRN Emokpae, Ejiroghene E, MD   4 mg at 10/14/21 0211  ? Or  ? LORazepam (ATIVAN) injection 1-4 mg  1-4 mg Intravenous Q1H PRN Emokpae, Ejiroghene E, MD      ? multivitamin with minerals tablet 1 tablet  1 tablet Oral Daily Emokpae, Ejiroghene E, MD   1 tablet at 10/14/21 0823  ? polyethylene glycol (MIRALAX / GLYCOLAX) packet 17 g  17 g Oral Daily PRN Emokpae, Ejiroghene E, MD      ? thiamine tablet 100 mg  100 mg Oral Daily Emokpae, Ejiroghene E, MD   100 mg at 10/14/21 0824  ? Or  ? thiamine (B-1) injection 100 mg  100 mg Intravenous Daily Emokpae, Ejiroghene E, MD      ? ? ? ?Discharge Medications: ?Please see discharge summary for a list of discharge medications. ? ?Relevant Imaging Results: ? ?Relevant Lab Results: ? ? ?Additional Information ?SSN: [id number] ? ?Shade Flood, LCSW ? ? ? ? ?

## 2021-10-15 DIAGNOSIS — F1027 Alcohol dependence with alcohol-induced persisting dementia: Secondary | ICD-10-CM | POA: Diagnosis not present

## 2021-10-15 DIAGNOSIS — F5000 Anorexia nervosa, unspecified: Secondary | ICD-10-CM | POA: Diagnosis not present

## 2021-10-15 DIAGNOSIS — E878 Other disorders of electrolyte and fluid balance, not elsewhere classified: Secondary | ICD-10-CM | POA: Diagnosis not present

## 2021-10-15 DIAGNOSIS — R5381 Other malaise: Secondary | ICD-10-CM | POA: Diagnosis not present

## 2021-10-15 DIAGNOSIS — N179 Acute kidney failure, unspecified: Secondary | ICD-10-CM | POA: Diagnosis not present

## 2021-10-15 DIAGNOSIS — F10239 Alcohol dependence with withdrawal, unspecified: Secondary | ICD-10-CM | POA: Diagnosis not present

## 2021-10-15 DIAGNOSIS — R2689 Other abnormalities of gait and mobility: Secondary | ICD-10-CM | POA: Diagnosis not present

## 2021-10-15 DIAGNOSIS — R41841 Cognitive communication deficit: Secondary | ICD-10-CM | POA: Diagnosis not present

## 2021-10-15 DIAGNOSIS — F17210 Nicotine dependence, cigarettes, uncomplicated: Secondary | ICD-10-CM | POA: Diagnosis not present

## 2021-10-15 DIAGNOSIS — Z741 Need for assistance with personal care: Secondary | ICD-10-CM | POA: Diagnosis not present

## 2021-10-15 DIAGNOSIS — I10 Essential (primary) hypertension: Secondary | ICD-10-CM | POA: Diagnosis not present

## 2021-10-15 DIAGNOSIS — E876 Hypokalemia: Secondary | ICD-10-CM | POA: Diagnosis not present

## 2021-10-15 DIAGNOSIS — G9341 Metabolic encephalopathy: Secondary | ICD-10-CM | POA: Diagnosis not present

## 2021-10-15 DIAGNOSIS — G629 Polyneuropathy, unspecified: Secondary | ICD-10-CM | POA: Diagnosis not present

## 2021-10-15 DIAGNOSIS — F10939 Alcohol use, unspecified with withdrawal, unspecified: Secondary | ICD-10-CM | POA: Diagnosis not present

## 2021-10-15 LAB — CBC
HCT: 30.8 % — ABNORMAL LOW (ref 39.0–52.0)
Hemoglobin: 10.2 g/dL — ABNORMAL LOW (ref 13.0–17.0)
MCH: 31.2 pg (ref 26.0–34.0)
MCHC: 33.1 g/dL (ref 30.0–36.0)
MCV: 94.2 fL (ref 80.0–100.0)
Platelets: 168 10*3/uL (ref 150–400)
RBC: 3.27 MIL/uL — ABNORMAL LOW (ref 4.22–5.81)
RDW: 16.8 % — ABNORMAL HIGH (ref 11.5–15.5)
WBC: 4.1 10*3/uL (ref 4.0–10.5)
nRBC: 0 % (ref 0.0–0.2)

## 2021-10-15 LAB — BASIC METABOLIC PANEL
Anion gap: 8 (ref 5–15)
BUN: 13 mg/dL (ref 8–23)
CO2: 26 mmol/L (ref 22–32)
Calcium: 8.9 mg/dL (ref 8.9–10.3)
Chloride: 103 mmol/L (ref 98–111)
Creatinine, Ser: 0.86 mg/dL (ref 0.61–1.24)
GFR, Estimated: >60 mL/min (ref >60)
Glucose, Bld: 89 mg/dL (ref 70–99)
Potassium: 3.5 mmol/L (ref 3.5–5.1)
Sodium: 137 mmol/L (ref 135–145)

## 2021-10-15 LAB — MAGNESIUM: Magnesium: 1.9 mg/dL (ref 1.7–2.4)

## 2021-10-15 MED ORDER — CHLORDIAZEPOXIDE HCL 25 MG PO CAPS
25.0000 mg | ORAL_CAPSULE | Freq: Four times a day (QID) | ORAL | 0 refills | Status: DC | PRN
Start: 2021-10-15 — End: 2022-01-08

## 2021-10-15 NOTE — Care Management Important Message (Signed)
Important Message ? ?Patient Details  ?Name: Scott Jackson ?MRN: [id number] ?Date of Birth: 11-21-1950 ? ? ?Medicare Important Message Given:  Yes ? ? ? ? ?Tommy Medal ?10/15/2021, 11:45 AM ?

## 2021-10-15 NOTE — TOC Transition Note (Signed)
Transition of Care (TOC) - CM/SW Discharge Note ? ? ?Patient Details  ?Name: Scott Jackson ?MRN: [id number] ?Date of Birth: 03/05/1951 ? ?Transition of Care (TOC) CM/SW Contact:  ?Shade Flood, LCSW ?Phone Number: ?10/15/2021, 12:25 PM ? ? ?Clinical Narrative:    ? ?TOC following. Pt stable for dc to SNF today per MD. Pt has insurance auth and bed offer from [facility]. Spoke with pt to update and pt is agreeable to [facility]. Plan is for dc today with w/c van. DC clinical sent electronically. RN to call report. [facility] w/c Lucianne Lei transport arranged. ? ?No other TOC needs for dc. ? ?Final next level of care: [place] ?Barriers to Discharge: Barriers Resolved ? ? ?Patient Goals and CMS Choice ?Patient states their goals for this hospitalization and ongoing recovery are:: return home ?  ?Choice offered to / list presented to : Patient ? ?Discharge Placement ?  ?           ?Patient chooses bed at: [facility] ?Patient to be transferred to facility by: [facility] ?Name of family member notified: Jasmine ?Patient and family notified of of transfer: 10/15/21 ? ?Discharge Plan and Services ?In-house Referral: Clinical Social Work ?  ?Post Acute Care Choice: Home Health          ?  ?  ?  ?  ?  ?HH Arranged: PT ?[place] Agency: [place] ([place]) ?Date HH Agency Contacted: 10/13/21 ?Time [place]: 0160 ?Representative spoke with at [place]: Vaughan Basta ? ?Social Determinants of Health (SDOH) Interventions ?  ? ? ?Readmission Risk Interventions ?   ? View : No data to display.  ?  ?  ?  ? ? ? ? ? ?

## 2021-10-15 NOTE — Progress Notes (Signed)
Patient transferred to facility by [facility]. AVS printed and placed in packet ?

## 2021-10-15 NOTE — Discharge Summary (Signed)
?Physician Discharge Summary ?  ?Patient: Scott Jackson MRN: [id number] DOB: May 14, 1951  ?Admit date:     10/12/2021  ?Discharge date: 10/15/21  ?Discharge Physician: Rodena Goldmann  ? ?PCP: Caren Macadam, MD  ? ?Recommendations at discharge:  ? ?Follow-up with PCP in 1-2 weeks ?Librium ordered as needed for withdrawal symptoms related to recent alcohol abuse ?Continue home medications as prior, however patient does not appear to have been taking most of these due to noncompliance.  Resume only amlodipine for blood pressure agents for now ? ?Discharge Diagnoses: ?Principal Problem: ?  AKI (acute kidney injury) ([place]) ?Active Problems: ?  Electrolyte abnormality ?  Alcohol abuse ?  Alcoholism associated with dementia ([place]) ?  COPD/Emphysema of lung ([place]) ?  Seizure disorder ([place]) ?  Tobacco abuse ?  Acute metabolic encephalopathy ?  Prolonged QT interval ? ?Resolved Problems: ?  * No resolved hospital problems. * ? ?Hospital Course: ?Scott Jackson is a 71 y.o. male with medical history significant for alcohol abuse, alcoholic dementia, COPD, seizure hx, failure to thrive. Patient was brought to the ED from home via EMS with reports of an episode of unresponsiveness.  He is a heavy drinker and had stopped drinking on 3/17.  Daughter states that he also does not take his home medications on a regular basis over the last 5 months.  This includes seizure medications.  He was admitted with acute metabolic encephalopathy as well as AKI.  His AKI has improved back to baseline.  His mentation has improved back to baseline as well and he is no longer experiencing any issues with delirium.  He has been assessed by PT with recommendations for SNF and he is stable for discharge today. ? ?Assessment and Plan: ?* AKI (acute kidney injury) ([place]) ?Creatinine elevated at 3.58 on admission, baseline 0.9-1.0.  Likely prerenal with poor oral intake, vomiting diarrhea x 1 day.  ?-Creatinine back to baseline, can be followed up  outpatient ? ?Electrolyte abnormality ?No further abnormalities noted ? ?Prolonged QT interval ?. ? ?Acute metabolic encephalopathy ?Episode of unresponsive, reports of twitching of the right face and jerking of the left arm.  History of seizure activity in the setting of alcohol abuse.  Patient reports his last drink of alcohol was 17th, 4 days ago.  Possible etiologies-hypotension and dehydration, seizure activity and possible postictal state, related to alcohol withdrawal.  But he also has marked left electrolyte abnormalities with potassium of 2.7 and magnesium of 1 putting him at risk for arrhythmias.  Head CT without acute abnormality.  Patient fell yesterday, reports of baseline dementia. ?-No further issues with delirium or confusion noted and patient is back to baseline.  PT recommending SNF on discharge. ? ? ?Tobacco abuse ?Counseled on cessation ? ?Seizure disorder ([place]) ?Per daughter, In the setting of alcohol abuse.  He is supposed to be on Keppra but has not been compliant with his medication in months. ?-Resume Keppra 1000 twice daily ?-Resume gabapentin ? ?COPD/Emphysema of lung ([place]) ?Stable. ? ?Alcohol abuse ?At baseline drinks half a pint of vodka daily. ?-Noted to have some mild delirium and alcohol withdrawal symptoms during the stay which have now resolved ?-Counseled on cessation and will be given Librium as needed for further withdrawal symptoms ? ? ? ? ?  ? ? ?Consultants: None ?Procedures performed: None ?Disposition: Skilled nursing facility ?Diet recommendation:  ?Discharge Diet Orders (From admission, onward)  ? ?  Start     Ordered  ? 10/15/21 0000  Diet -  low sodium heart healthy       ? 10/15/21 1054  ? ?  ?  ? ?  ? ?Cardiac diet ?DISCHARGE MEDICATION: ?Allergies as of 10/15/2021   ?No Known Allergies ?  ? ?  ?Medication List  ?  ? ?STOP taking these medications   ? ?diclofenac Sodium 1 % Gel ?Commonly known as: VOLTAREN ?  ?hydrALAZINE 50 MG tablet ?Commonly known as: APRESOLINE ?   ?isosorbide mononitrate 30 MG 24 hr tablet ?Commonly known as: IMDUR ?  ?lactulose 10 GM/15ML solution ?Commonly known as: Froid ?  ?lisinopril-hydrochlorothiazide 20-12.5 MG tablet ?Commonly known as: ZESTORETIC ?  ?pantoprazole 40 MG tablet ?Commonly known as: Protonix ?  ?PARoxetine 40 MG tablet ?Commonly known as: PAXIL ?  ? ?  ? ?TAKE these medications   ? ?acetaminophen 325 MG tablet ?Commonly known as: TYLENOL ?Take 2 tablets (650 mg total) by mouth every 6 (six) hours as needed for mild pain (or Fever >/= 101). ?  ?albuterol 108 (90 Base) MCG/ACT inhaler ?Commonly known as: VENTOLIN HFA ?Inhale 2 puffs into the lungs every 4 (four) hours as needed for wheezing or shortness of breath. ?  ?amLODipine 10 MG tablet ?Commonly known as: NORVASC ?Take 1 tablet (10 mg total) by mouth daily. ?  ?chlordiazePOXIDE 25 MG capsule ?Commonly known as: LIBRIUM ?Take 1 capsule (25 mg total) by mouth 4 (four) times daily as needed for anxiety or withdrawal. ?  ?fluticasone furoate-vilanterol 200-25 MCG/INH Aepb ?Commonly known as: BREO ELLIPTA ?Inhale 1 puff into the lungs daily. ?  ?folic acid 1 MG tablet ?Commonly known as: FOLVITE ?Take 1 tablet (1 mg total) by mouth daily. ?  ?gabapentin 300 MG capsule ?Commonly known as: NEURONTIN ?1 capsule ?  ?levETIRAcetam 500 MG tablet ?Commonly known as: KEPPRA ?Take 2 tablets (1,000 mg total) by mouth 2 (two) times daily. ?  ?multivitamin with minerals Tabs tablet ?Take 1 tablet by mouth daily. ?  ?tamsulosin 0.4 MG Caps capsule ?Commonly known as: FLOMAX ?Take 1 capsule (0.4 mg total) by mouth daily. ?  ?thiamine 100 MG tablet ?Take 1 tablet (100 mg total) by mouth daily. ?  ? ?  ? ? Follow-up Information   ? ? Caren Macadam, MD. Schedule an appointment as soon as possible for a visit in 1 week(s).   ?Specialty: Family Medicine ?Contact information: ?[address] ?[place] [address] ?[phone] ? ? ?  ?  ? ?  ?  ? ?  ? ?Discharge Exam: ?Filed Weights  ? 10/12/21  1415 10/12/21 2043  ?Weight: 62.6 kg 63.9 kg  ? ?Examination: ?Physical Exam: ? ?Constitutional: WN/WD, NAD and appears calm and comfortable ?Eyes: PERRL, lids and conjunctivae normal, sclerae anicteric  ?Neck: Appears normal, supple, no cervical masses, normal ROM, no appreciable thyromegaly ?Respiratory: Clear to auscultation bilaterally, no wheezing, rales, rhonchi or crackles. Normal respiratory effort and patient is not tachypenic. No accessory muscle use.  ?Cardiovascular: RRR, no murmurs / rubs / gallops. S1 and S2 auscultated. No extremity edema. 2+ pedal pulses. No carotid bruits.  ?Abdomen: Soft, non-tender, non-distended. No masses palpated. No appreciable hepatosplenomegaly. Bowel sounds positive.  ?GU: Deferred. ?Musculoskeletal: No clubbing / cyanosis of digits/nails. No joint deformity upper and lower extremities. Good ROM, no contractures. Normal strength and muscle tone.  ?Skin: No rashes, lesions, ulcers. No induration; Warm and dry.  ?Neurologic: CN 2-12 grossly intact with no focal deficits.  ?Psychiatric: Normal judgment and insight. Alert and oriented x 3. Normal mood and appropriate affect.  ? ? ?  Condition at discharge: good ? ?The results of significant diagnostics from this hospitalization (including imaging, microbiology, ancillary and laboratory) are listed below for reference.  ? ?Imaging Studies: ?CT Head Wo Contrast ? ?Result Date: 10/12/2021 ?CLINICAL DATA:  Possible seizure.  Alcohol withdrawal. EXAM: CT HEAD WITHOUT CONTRAST TECHNIQUE: Contiguous axial images were obtained from the base of the skull through the vertex without intravenous contrast. RADIATION DOSE REDUCTION: This exam was performed according to the departmental dose-optimization program which includes automated exposure control, adjustment of the mA and/or kV according to patient size and/or use of iterative reconstruction technique. COMPARISON:  CT head dated January 09, 2021. FINDINGS: Brain: No evidence of acute  infarction, hemorrhage, hydrocephalus, extra-axial collection or mass lesion/mass effect. Stable atrophy and mild chronic microvascular ischemic changes. Vascular: No hyperdense vessel or unexpected calcification. Skull: Nor

## 2021-10-15 NOTE — Progress Notes (Signed)
Report called to Wendy Poet, LPN at [facility]. ?

## 2021-10-15 NOTE — Progress Notes (Signed)
Attempted to call report, phone rings and then goes to a busy signal  ?

## 2021-10-19 DIAGNOSIS — F10939 Alcohol use, unspecified with withdrawal, unspecified: Secondary | ICD-10-CM | POA: Diagnosis not present

## 2021-10-19 DIAGNOSIS — G9341 Metabolic encephalopathy: Secondary | ICD-10-CM | POA: Diagnosis not present

## 2021-10-19 DIAGNOSIS — R5381 Other malaise: Secondary | ICD-10-CM | POA: Diagnosis not present

## 2021-10-22 DIAGNOSIS — F10939 Alcohol use, unspecified with withdrawal, unspecified: Secondary | ICD-10-CM | POA: Diagnosis not present

## 2021-10-22 DIAGNOSIS — R5381 Other malaise: Secondary | ICD-10-CM | POA: Diagnosis not present

## 2021-10-22 DIAGNOSIS — F1027 Alcohol dependence with alcohol-induced persisting dementia: Secondary | ICD-10-CM | POA: Diagnosis not present

## 2021-10-22 DIAGNOSIS — G9341 Metabolic encephalopathy: Secondary | ICD-10-CM | POA: Diagnosis not present

## 2021-10-27 DIAGNOSIS — F17210 Nicotine dependence, cigarettes, uncomplicated: Secondary | ICD-10-CM | POA: Diagnosis not present

## 2021-10-27 DIAGNOSIS — F1010 Alcohol abuse, uncomplicated: Secondary | ICD-10-CM | POA: Diagnosis not present

## 2021-10-27 DIAGNOSIS — G9341 Metabolic encephalopathy: Secondary | ICD-10-CM | POA: Diagnosis not present

## 2021-10-27 DIAGNOSIS — R911 Solitary pulmonary nodule: Secondary | ICD-10-CM | POA: Diagnosis not present

## 2021-10-27 DIAGNOSIS — R627 Adult failure to thrive: Secondary | ICD-10-CM | POA: Diagnosis not present

## 2021-10-27 DIAGNOSIS — G40309 Generalized idiopathic epilepsy and epileptic syndromes, not intractable, without status epilepticus: Secondary | ICD-10-CM | POA: Diagnosis not present

## 2021-10-27 DIAGNOSIS — F1097 Alcohol use, unspecified with alcohol-induced persisting dementia: Secondary | ICD-10-CM | POA: Diagnosis not present

## 2021-10-27 DIAGNOSIS — Z9181 History of falling: Secondary | ICD-10-CM | POA: Diagnosis not present

## 2021-10-27 DIAGNOSIS — J439 Emphysema, unspecified: Secondary | ICD-10-CM | POA: Diagnosis not present

## 2021-10-27 DIAGNOSIS — I10 Essential (primary) hypertension: Secondary | ICD-10-CM | POA: Diagnosis not present

## 2021-11-08 DIAGNOSIS — R9431 Abnormal electrocardiogram [ECG] [EKG]: Secondary | ICD-10-CM | POA: Diagnosis not present

## 2021-11-08 DIAGNOSIS — G40909 Epilepsy, unspecified, not intractable, without status epilepticus: Secondary | ICD-10-CM | POA: Diagnosis not present

## 2021-11-08 DIAGNOSIS — F1010 Alcohol abuse, uncomplicated: Secondary | ICD-10-CM | POA: Diagnosis not present

## 2021-11-17 ENCOUNTER — Telehealth: Payer: Self-pay

## 2021-11-17 ENCOUNTER — Other Ambulatory Visit: Payer: Self-pay

## 2021-11-17 ENCOUNTER — Ambulatory Visit (INDEPENDENT_AMBULATORY_CARE_PROVIDER_SITE_OTHER): Payer: Medicare Other | Admitting: Internal Medicine

## 2021-11-17 VITALS — BP 124/70 | HR 70 | Temp 97.5°F | Ht 74.0 in | Wt 141.8 lb

## 2021-11-17 DIAGNOSIS — F1010 Alcohol abuse, uncomplicated: Secondary | ICD-10-CM

## 2021-11-17 DIAGNOSIS — K219 Gastro-esophageal reflux disease without esophagitis: Secondary | ICD-10-CM | POA: Diagnosis not present

## 2021-11-17 DIAGNOSIS — R948 Abnormal results of function studies of other organs and systems: Secondary | ICD-10-CM

## 2021-11-17 DIAGNOSIS — R933 Abnormal findings on diagnostic imaging of other parts of digestive tract: Secondary | ICD-10-CM

## 2021-11-17 MED ORDER — PEG 3350-KCL-NA BICARB-NACL 420 G PO SOLR: 4000.0000 mL | ORAL | 0 refills | Status: DC

## 2021-11-17 NOTE — Telephone Encounter (Signed)
Called daughter, [name], informed her of pt's pre-op appt 11/19/21 at 3:15pm. ?

## 2021-11-17 NOTE — H&P (View-Only) (Signed)
? ? ?Referring Provider: Caren Macadam, MD ?Primary Care Physician:  Caren Macadam, MD ?Primary GI:  Dr. Abbey Chatters ? ?Chief Complaint  ?Patient presents with  ? Follow-up  ?  colonoscopy  ? Hospitalization Follow-up  ? ? ?HPI:   ?Scott Jackson is a 71 y.o. male who presents to clinic today for follow-up visit. ? ?He has history of gastritis in the setting of NSAIDs and alcohol with recommendations for PPI indefinitely.  Previously attempted colonoscopy due to hematochezia in June 2020 which revealed severe diverticulosis with fixed rectosigmoid colon, internal hemorrhoids suspected to be source of rectal bleeding. Due to incomplete exam, recommended colonoscopy at [facility] with x-ray. ? ?Never had colonoscopy scheduled at [facility]. Did not want to go all the way to [facility] for procedure.  Prior CT in June 2020 due to incomplete colonoscopy showed scattered colonic diverticulosis, possible mild wall thickening of the cecum and ascending colon, possibly due to limited distention. ? ?CT A/P with contrast 10/28/2019 with grossly unremarkable liver, focal fat/altered perfusion along falciform ligament, mild bladder wall thickening although under distention, mild rectal wall thickening with perirectal stranding. ? ?Was again referred to [facility] on previous visit of patient refused to travel to [place]. ? ?Recent hospitalization March 2023 for a acute kidney injury and metabolic encephalopathy.  Continued alcohol abuse.  Now on lactulose therapy. ? ?Underwent CT chest 09/13/2021 for lung cancer screening which showed left lower lobe region concerning for potential neoplasm.  Subsequent PET scan 09/30/2021 showed no hypermetabolism corresponding to left lower lobe region.  Did note distal esophageal thickening. ? ?Today he states he is doing okay.  Mentation improved.  Accompanied by his daughter.  Does not wish to go to [facility] for any care.  Does note some epigastric pain/discomfort.  Also has  reflux. ? ? ?Past Medical History:  ?Diagnosis Date  ? ETOH abuse   ? Gastritis and duodenitis   ? Hypertension   ? Pulmonary nodule   ? Tobacco abuse   ? ? ?Past Surgical History:  ?Procedure Laterality Date  ? ANKLE FRACTURE SURGERY    ? APPENDECTOMY    ? BIOPSY  01/08/2019  ? Procedure: BIOPSY;  Surgeon: Danie Binder, MD;  Location: [facility];  Service: Endoscopy;;  gastric  ? COLONOSCOPY  01/2015  ? Dr. Posey Pronto: two 2-60m rectal polyps removed, hyperplastic  ? COLONOSCOPY  2007  ? Dr. PPosey Pronto three adenomas removed measuring 4, 10, 139m diverticulosis  ? COLONOSCOPY WITH PROPOFOL N/A 01/08/2019  ? Dr. FiOneida AlarSevere diverticulosis with fixed rectosigmoid region, rectal bleeding likely due to internal hemorrhoids.  [facility] to [facility] colon (patient never followed through).  ? ESOPHAGOGASTRODUODENOSCOPY (EGD) WITH PROPOFOL N/A 01/08/2019  ? Dr. FiOneida AlarLow-grade narrowing Schatzki ring at the GE junction, medium sized hiatal hernia, erosive gastritis/duodenitis in the setting of NSAID and alcohol use.  Gastric biopsy showed gastropathy but no H. pylori  ? intestininal blockage  1970  ? ? ?Current Outpatient Medications  ?Medication Sig Dispense Refill  ? albuterol (VENTOLIN HFA) 108 (90 Base) MCG/ACT inhaler Inhale 2 puffs into the lungs every 4 (four) hours as needed for wheezing or shortness of breath. 18 g 1  ? amLODipine (NORVASC) 10 MG tablet Take 1 tablet (10 mg total) by mouth daily. 90 tablet 3  ? fluticasone furoate-vilanterol (BREO ELLIPTA) 200-25 MCG/INH AEPB Inhale 1 puff into the lungs daily. 60 each 3  ? folic acid (  FOLVITE) 1 MG tablet Take 1 tablet (1 mg total) by mouth daily. 30 tablet 5  ? levETIRAcetam (KEPPRA) 500 MG tablet Take 2 tablets (1,000 mg total) by mouth 2 (two) times daily. 360 tablet 3  ? Multiple Vitamin (MULTIVITAMIN WITH MINERALS) TABS tablet Take 1 tablet by mouth daily. 30 tablet 3  ?  polyethylene glycol-electrolytes (TRILYTE) 420 g solution Take 4,000 mLs by mouth as directed. 4000 mL 0  ? tamsulosin (FLOMAX) 0.4 MG CAPS capsule Take 1 capsule (0.4 mg total) by mouth daily. 30 capsule 3  ? thiamine 100 MG tablet Take 1 tablet (100 mg total) by mouth daily. 30 tablet 3  ? acetaminophen (TYLENOL) 325 MG tablet Take 2 tablets (650 mg total) by mouth every 6 (six) hours as needed for mild pain (or Fever >/= 101). (Patient not taking: Reported on 11/17/2021) 12 tablet 0  ? chlordiazePOXIDE (LIBRIUM) 25 MG capsule Take 1 capsule (25 mg total) by mouth 4 (four) times daily as needed for anxiety or withdrawal. (Patient not taking: Reported on 11/17/2021) 10 capsule 0  ? gabapentin (NEURONTIN) 300 MG capsule 1 capsule (Patient not taking: Reported on 10/14/2021)    ? ?No current facility-administered medications for this visit.  ? ? ?Allergies as of 11/17/2021  ? (No Known Allergies)  ? ? ?Family History  ?Problem Relation Age of Onset  ? Hypertension Mother   ? Stroke Mother   ? Fibromyalgia Daughter   ? Lupus Daughter   ? Anxiety disorder Daughter   ? Post-traumatic stress disorder Daughter   ? Asthma Daughter   ? Breast cancer Sister   ? Breast cancer Sister   ? Colon cancer Neg Hx   ? ? ?Social History  ? ?Socioeconomic History  ? Marital status: Legally Separated  ?  Spouse name: Not on file  ? Number of children: Not on file  ? Years of education: Not on file  ? Highest education level: Not on file  ?Occupational History  ? Not on file  ?Tobacco Use  ? Smoking status: Every Day  ?  Packs/day: 0.25  ?  Years: 50.00  ?  Pack years: 12.50  ?  Types: Cigarettes  ? Smokeless tobacco: Never  ?Vaping Use  ? Vaping Use: Never used  ?Substance and Sexual Activity  ? Alcohol use: Yes  ?  Comment: every few days; 1/5 liquor every 2 weeks, previously daily ETOH use.  ? Drug use: No  ? Sexual activity: Yes  ?Other Topics Concern  ? Not on file  ?Social History Narrative  ? Works at [employer] in [place],  [place].  ? Separated.  ? [place], [place].   ? ?Social Determinants of Health  ? ?Financial Resource Strain: Not on file  ?Food Insecurity: Not on file  ?Transportation Needs: Not on file  ?Physical Activity: Not on file  ?Stress: Not on file  ?Social Connections: Not on file  ? ? ?Subjective: ?Review of Systems  ?Constitutional:  Negative for chills and fever.  ?HENT:  Negative for congestion and hearing loss.   ?Eyes:  Negative for blurred vision and double vision.  ?Respiratory:  Negative for cough and shortness of breath.   ?Cardiovascular:  Negative for chest pain and palpitations.  ?Gastrointestinal:  Positive for heartburn. Negative for abdominal pain, blood in stool, constipation, diarrhea, melena and vomiting.  ?Genitourinary:  Negative for dysuria and urgency.  ?Musculoskeletal:  Negative for joint pain and myalgias.  ?Skin:  Negative for itching and rash.  ?Neurological:  Negative for dizziness and headaches.  ?Psychiatric/Behavioral:  Negative for depression. The patient is not nervous/anxious.   ? ? ?Objective: ?BP 124/70   Pulse 70   Temp (!) 97.5 ?F (36.4 ?C)   Ht '6\' 2"'$  (1.88 m)   Wt 141 lb 12.8 oz (64.3 kg)   BMI 18.21 kg/m?  ?Physical Exam ?Constitutional:   ?   Appearance: Normal appearance.  ?   Comments: Frail  ?HENT:  ?   Head: Normocephalic and atraumatic.  ?Eyes:  ?   Extraocular Movements: Extraocular movements intact.  ?   Conjunctiva/sclera: Conjunctivae normal.  ?Cardiovascular:  ?   Rate and Rhythm: Normal rate and regular rhythm.  ?Pulmonary:  ?   Effort: Pulmonary effort is normal.  ?   Breath sounds: Normal breath sounds.  ?Abdominal:  ?   General: Bowel sounds are normal.  ?   Palpations: Abdomen is soft.  ?Musculoskeletal:     ?   General: Normal range of motion.  ?   Cervical back: Normal range of motion and neck supple.  ?Skin: ?   General: Skin is warm.  ?Neurological:  ?   General: No focal deficit present.  ?   Mental Status: He is alert and oriented to person, place, and time.   ?Psychiatric:     ?   Mood and Affect: Mood normal.     ?   Behavior: Behavior normal.  ? ? ? ?Assessment: ?*Chronic GERD ?*Abnormal PET scan of esophagus ?*Abnormal CT imaging of the colon ?*Chronic alcohol abuse ?

## 2021-11-17 NOTE — Progress Notes (Signed)
? ? ?Referring Provider: Caren Macadam, MD ?Primary Care Physician:  Caren Macadam, MD ?Primary GI:  Dr. Abbey Chatters ? ?Chief Complaint  ?Patient presents with  ? Follow-up  ?  colonoscopy  ? Hospitalization Follow-up  ? ? ?HPI:   ?Scott Jackson is a 71 y.o. male who presents to clinic today for follow-up visit. ? ?He has history of gastritis in the setting of NSAIDs and alcohol with recommendations for PPI indefinitely.  Previously attempted colonoscopy due to hematochezia in June 2020 which revealed severe diverticulosis with fixed rectosigmoid colon, internal hemorrhoids suspected to be source of rectal bleeding. Due to incomplete exam, recommended colonoscopy at [facility] with x-ray. ? ?Never had colonoscopy scheduled at [facility]. Did not want to go all the way to [facility] for procedure.  Prior CT in June 2020 due to incomplete colonoscopy showed scattered colonic diverticulosis, possible mild wall thickening of the cecum and ascending colon, possibly due to limited distention. ? ?CT A/P with contrast 10/28/2019 with grossly unremarkable liver, focal fat/altered perfusion along falciform ligament, mild bladder wall thickening although under distention, mild rectal wall thickening with perirectal stranding. ? ?Was again referred to [facility] on previous visit of patient refused to travel to [place]. ? ?Recent hospitalization March 2023 for a acute kidney injury and metabolic encephalopathy.  Continued alcohol abuse.  Now on lactulose therapy. ? ?Underwent CT chest 09/13/2021 for lung cancer screening which showed left lower lobe region concerning for potential neoplasm.  Subsequent PET scan 09/30/2021 showed no hypermetabolism corresponding to left lower lobe region.  Did note distal esophageal thickening. ? ?Today he states he is doing okay.  Mentation improved.  Accompanied by his daughter.  Does not wish to go to [facility] for any care.  Does note some epigastric pain/discomfort.  Also has  reflux. ? ? ?Past Medical History:  ?Diagnosis Date  ? ETOH abuse   ? Gastritis and duodenitis   ? Hypertension   ? Pulmonary nodule   ? Tobacco abuse   ? ? ?Past Surgical History:  ?Procedure Laterality Date  ? ANKLE FRACTURE SURGERY    ? APPENDECTOMY    ? BIOPSY  01/08/2019  ? Procedure: BIOPSY;  Surgeon: Danie Binder, MD;  Location: [facility];  Service: Endoscopy;;  gastric  ? COLONOSCOPY  01/2015  ? Dr. Posey Pronto: two 2-71m rectal polyps removed, hyperplastic  ? COLONOSCOPY  2007  ? Dr. PPosey Pronto three adenomas removed measuring 4, 10, 154m diverticulosis  ? COLONOSCOPY WITH PROPOFOL N/A 01/08/2019  ? Dr. FiOneida AlarSevere diverticulosis with fixed rectosigmoid region, rectal bleeding likely due to internal hemorrhoids.  [facility] to [facility] (patient never followed through).  ? ESOPHAGOGASTRODUODENOSCOPY (EGD) WITH PROPOFOL N/A 01/08/2019  ? Dr. FiOneida AlarLow-grade narrowing Schatzki ring at the GE junction, medium sized hiatal hernia, erosive gastritis/duodenitis in the setting of NSAID and alcohol use.  Gastric biopsy showed gastropathy but no H. pylori  ? intestininal blockage  1970  ? ? ?Current Outpatient Medications  ?Medication Sig Dispense Refill  ? albuterol (VENTOLIN HFA) 108 (90 Base) MCG/ACT inhaler Inhale 2 puffs into the lungs every 4 (four) hours as needed for wheezing or shortness of breath. 18 g 1  ? amLODipine (NORVASC) 10 MG tablet Take 1 tablet (10 mg total) by mouth daily. 90 tablet 3  ? fluticasone furoate-vilanterol (BREO ELLIPTA) 200-25 MCG/INH AEPB Inhale 1 puff into the lungs daily. 60 each 3  ? folic acid (  FOLVITE) 1 MG tablet Take 1 tablet (1 mg total) by mouth daily. 30 tablet 5  ? levETIRAcetam (KEPPRA) 500 MG tablet Take 2 tablets (1,000 mg total) by mouth 2 (two) times daily. 360 tablet 3  ? Multiple Vitamin (MULTIVITAMIN WITH MINERALS) TABS tablet Take 1 tablet by mouth daily. 30 tablet 3  ?  polyethylene glycol-electrolytes (TRILYTE) 420 g solution Take 4,000 mLs by mouth as directed. 4000 mL 0  ? tamsulosin (FLOMAX) 0.4 MG CAPS capsule Take 1 capsule (0.4 mg total) by mouth daily. 30 capsule 3  ? thiamine 100 MG tablet Take 1 tablet (100 mg total) by mouth daily. 30 tablet 3  ? acetaminophen (TYLENOL) 325 MG tablet Take 2 tablets (650 mg total) by mouth every 6 (six) hours as needed for mild pain (or Fever >/= 101). (Patient not taking: Reported on 11/17/2021) 12 tablet 0  ? chlordiazePOXIDE (LIBRIUM) 25 MG capsule Take 1 capsule (25 mg total) by mouth 4 (four) times daily as needed for anxiety or withdrawal. (Patient not taking: Reported on 11/17/2021) 10 capsule 0  ? gabapentin (NEURONTIN) 300 MG capsule 1 capsule (Patient not taking: Reported on 10/14/2021)    ? ?No current facility-administered medications for this visit.  ? ? ?Allergies as of 11/17/2021  ? (No Known Allergies)  ? ? ?Family History  ?Problem Relation Age of Onset  ? Hypertension Mother   ? Stroke Mother   ? Fibromyalgia Daughter   ? Lupus Daughter   ? Anxiety disorder Daughter   ? Post-traumatic stress disorder Daughter   ? Asthma Daughter   ? Breast cancer Sister   ? Breast cancer Sister   ? Colon cancer Neg Hx   ? ? ?Social History  ? ?Socioeconomic History  ? Marital status: Legally Separated  ?  Spouse name: Not on file  ? Number of children: Not on file  ? Years of education: Not on file  ? Highest education level: Not on file  ?Occupational History  ? Not on file  ?Tobacco Use  ? Smoking status: Every Day  ?  Packs/day: 0.25  ?  Years: 50.00  ?  Pack years: 12.50  ?  Types: Cigarettes  ? Smokeless tobacco: Never  ?Vaping Use  ? Vaping Use: Never used  ?Substance and Sexual Activity  ? Alcohol use: Yes  ?  Comment: every few days; 1/5 liquor every 2 weeks, previously daily ETOH use.  ? Drug use: No  ? Sexual activity: Yes  ?Other Topics Concern  ? Not on file  ?Social History Narrative  ? Works at [employer] in [place],  [place].  ? Separated.  ? [place], [place].   ? ?Social Determinants of Health  ? ?Financial Resource Strain: Not on file  ?Food Insecurity: Not on file  ?Transportation Needs: Not on file  ?Physical Activity: Not on file  ?Stress: Not on file  ?Social Connections: Not on file  ? ? ?Subjective: ?Review of Systems  ?Constitutional:  Negative for chills and fever.  ?HENT:  Negative for congestion and hearing loss.   ?Eyes:  Negative for blurred vision and double vision.  ?Respiratory:  Negative for cough and shortness of breath.   ?Cardiovascular:  Negative for chest pain and palpitations.  ?Gastrointestinal:  Positive for heartburn. Negative for abdominal pain, blood in stool, constipation, diarrhea, melena and vomiting.  ?Genitourinary:  Negative for dysuria and urgency.  ?Musculoskeletal:  Negative for joint pain and myalgias.  ?Skin:  Negative for itching and rash.  ?Neurological:  Negative for dizziness and headaches.  ?Psychiatric/Behavioral:  Negative for depression. The patient is not nervous/anxious.   ? ? ?Objective: ?BP 124/70   Pulse 70   Temp (!) 97.5 ?F (36.4 ?C)   Ht '6\' 2"'$  (1.88 m)   Wt 141 lb 12.8 oz (64.3 kg)   BMI 18.21 kg/m?  ?Physical Exam ?Constitutional:   ?   Appearance: Normal appearance.  ?   Comments: Frail  ?HENT:  ?   Head: Normocephalic and atraumatic.  ?Eyes:  ?   Extraocular Movements: Extraocular movements intact.  ?   Conjunctiva/sclera: Conjunctivae normal.  ?Cardiovascular:  ?   Rate and Rhythm: Normal rate and regular rhythm.  ?Pulmonary:  ?   Effort: Pulmonary effort is normal.  ?   Breath sounds: Normal breath sounds.  ?Abdominal:  ?   General: Bowel sounds are normal.  ?   Palpations: Abdomen is soft.  ?Musculoskeletal:     ?   General: Normal range of motion.  ?   Cervical back: Normal range of motion and neck supple.  ?Skin: ?   General: Skin is warm.  ?Neurological:  ?   General: No focal deficit present.  ?   Mental Status: He is alert and oriented to person, place, and time.   ?Psychiatric:     ?   Mood and Affect: Mood normal.     ?   Behavior: Behavior normal.  ? ? ? ?Assessment: ?*Chronic GERD ?*Abnormal PET scan of esophagus ?*Abnormal CT imaging of the colon ?*Chronic alcohol abuse ?

## 2021-11-17 NOTE — Patient Instructions (Signed)
We will schedule you for upper endoscopy to further evaluate the esophageal thickening seen on your PET scan.  At the same time we will perform colonoscopy given abnormal CT imaging in the past. ? ?Further recommendations to follow. ? ?It was very nice meeting both you today. ? ?Dr. Abbey Chatters ? ?At [facility] we value your feedback. You may receive a survey about your visit today. Please share your experience as we strive to create trusting relationships with our patients to provide genuine, compassionate, quality care. ? ?We appreciate your understanding and patience as we review any laboratory studies, imaging, and other diagnostic tests that are ordered as we care for you. Our office policy is 5 business days for review of these results, and any emergent or urgent results are addressed in a timely manner for your best interest. If you do not hear from our office in 1 week, please contact us.  ? ?We also encourage the use of MyChart, which contains your medical information for your review as well. If you are not enrolled in this feature, an access code is on this after visit summary for your convenience. Thank you for allowing [place] to be involved in your care. ? ?It was great to see you today!  I hope you have a great rest of your Spring! ? ? ? ?Scott Jackson. Abbey Chatters, D.O. ?Gastroenterology and Hepatology ?[facility] ? ?

## 2021-11-18 ENCOUNTER — Ambulatory Visit (HOSPITAL_COMMUNITY)
Admission: RE | Admit: 2021-11-18 | Discharge: 2021-11-18 | Disposition: A | Discharge status: Home / Self Care | Payer: Medicare Other | Source: Ambulatory Visit | Attending: Acute Care | Admitting: Acute Care

## 2021-11-18 DIAGNOSIS — R911 Solitary pulmonary nodule: Secondary | ICD-10-CM

## 2021-11-18 NOTE — Patient Instructions (Signed)
? ? ? ? ? ? ? ? Scott Jackson ? 11/18/2021  ?  ? '@PREFPERIOPPHARMACY'$ @ ? ? Your procedure is scheduled on  11/22/2021. ? ? Report to [facility] at  1230  P.M. ? ? Call this number if you have problems the morning of surgery: ? [phone] ? ? Remember: ? Follow the diet and prep instructions given to you by the office. ? ?  Use your inhalers before you come and bring your rescue inhaler with you. ?  ? Take these medicines the morning of surgery with A SIP OF WATER  ? ?                              amlodipine, keppra. ?  ? ? Do not wear jewelry, make-up or nail polish. ? Do not wear lotions, powders, or perfumes, or deodorant. ? Do not shave 48 hours prior to surgery.  Men may shave face and neck. ? Do not bring valuables to the hospital. ? [facility] is not responsible for any belongings or valuables. ? ?Contacts, dentures or bridgework may not be worn into surgery.  Leave your suitcase in the car.  After surgery it may be brought to your room. ? ?For patients admitted to the hospital, discharge time will be determined by your treatment team. ? ?Patients discharged the day of surgery will not be allowed to drive home and must have someone with them for 24 hours.  ? ? ?Special instructions:   DO NOT smoke tobacco or vape for 24 hours before your procedure. ? ?Please read over the following fact sheets that you were given. ?Anesthesia Post-op Instructions and Care and Recovery After Surgery ?  ? ? ? Upper Endoscopy, Adult, Care After ?This sheet gives you information about how to care for yourself after your procedure. Your health care provider may also give you more specific instructions. If you have problems or questions, contact your health care provider. ?What can I expect after the procedure? ?After the procedure, it is common to have: ?A sore throat. ?Mild stomach pain or discomfort. ?Bloating. ?Nausea. ?Follow these instructions at home: ? ?Follow instructions from your health care provider about what to eat or  drink after your procedure. ?Return to your normal activities as told by your health care provider. Ask your health care provider what activities are safe for you. ?Take over-the-counter and prescription medicines only as told by your health care provider. ?If you were given a sedative during the procedure, it can affect you for several hours. Do not drive or operate machinery until your health care provider says that it is safe. ?Keep all follow-up visits as told by your health care provider. This is important. ?Contact a health care provider if you have: ?A sore throat that lasts longer than one day. ?Trouble swallowing. ?Get help right away if: ?You vomit blood or your vomit looks like coffee grounds. ?You have: ?A fever. ?Bloody, black, or tarry stools. ?A severe sore throat or you cannot swallow. ?Difficulty breathing. ?Severe pain in your chest or abdomen. ?Summary ?After the procedure, it is common to have a sore throat, mild stomach discomfort, bloating, and nausea. ?If you were given a sedative during the procedure, it can affect you for several hours. Do not drive or operate machinery until your health care provider says that it is safe. ?Follow instructions from your health care provider about what to eat or drink after your procedure. ?  Return to your normal activities as told by your health care provider. ?This information is not intended to replace advice given to you by your health care provider. Make sure you discuss any questions you have with your health care provider. ?Document Revised: 05/17/2019 Document Reviewed: 12/11/2017 ?Elsevier Patient Education ? [place]. ?Colonoscopy, Adult, Care After ?The following information offers guidance on how to care for yourself after your procedure. Your health care provider may also give you more specific instructions. If you have problems or questions, contact your health care provider. ?What can I expect after the procedure? ?After the procedure,  it is common to have: ?A small amount of blood in your stool for 24 hours after the procedure. ?Some gas. ?Mild cramping or bloating of your abdomen. ?Follow these instructions at home: ?Eating and drinking ? ?Drink enough fluid to keep your urine pale yellow. ?Follow instructions from your health care provider about eating or drinking restrictions. ?Resume your normal diet as told by your health care provider. Avoid heavy or fried foods that are hard to digest. ?Activity ?Rest as told by your health care provider. ?Avoid sitting for a long time without moving. Get up to take short walks every 1-2 hours. This is important to improve blood flow and breathing. Ask for help if you feel weak or unsteady. ?Return to your normal activities as told by your health care provider. Ask your health care provider what activities are safe for you. ?Managing cramping and bloating ? ?Try walking around when you have cramps or feel bloated. ?If directed, apply heat to your abdomen as told by your health care provider. Use the heat source that your health care provider recommends, such as a moist heat pack or a heating pad. ?Place a towel between your skin and the heat source. ?Leave the heat on for 20-30 minutes. ?Remove the heat if your skin turns bright red. This is especially important if you are unable to feel pain, heat, or cold. You have a greater risk of getting burned. ?General instructions ?If you were given a sedative during the procedure, it can affect you for several hours. Do not drive or operate machinery until your health care provider says that it is safe. ?For the first 24 hours after the procedure: ?Do not sign important documents. ?Do not drink alcohol. ?Do your regular daily activities at a slower pace than normal. ?Eat soft foods that are easy to digest. ?Take over-the-counter and prescription medicines only as told by your health care provider. ?Keep all follow-up visits. This is important. ?Contact a health  care provider if: ?You have blood in your stool 2-3 days after the procedure. ?Get help right away if: ?You have more than a small spotting of blood in your stool. ?You have large blood clots in your stool. ?You have swelling of your abdomen. ?You have nausea or vomiting. ?You have a fever. ?You have increasing pain in your abdomen that is not relieved with medicine. ?These symptoms may be an emergency. Get help right away. Call 911. ?Do not wait to see if the symptoms will go away. ?Do not drive yourself to the hospital. ?Summary ?After the procedure, it is common to have a small amount of blood in your stool. You may also have mild cramping and bloating of your abdomen. ?If you were given a sedative during the procedure, it can affect you for several hours. Do not drive or operate machinery until your health care provider says that it is safe. ?Get  help right away if you have a lot of blood in your stool, nausea or vomiting, a fever, or increased pain in your abdomen. ?This information is not intended to replace advice given to you by your health care provider. Make sure you discuss any questions you have with your health care provider. ?Document Revised: 03/03/2021 Document Reviewed: 03/03/2021 ?Elsevier Patient Education ? [place]. ?Monitored Anesthesia Care, Care After ?This sheet gives you information about how to care for yourself after your procedure. Your health care provider may also give you more specific instructions. If you have problems or questions, contact your health care provider. ?What can I expect after the procedure? ?After the procedure, it is common to have: ?Tiredness. ?Forgetfulness about what happened after the procedure. ?Impaired judgment for important decisions. ?Nausea or vomiting. ?Some difficulty with balance. ?Follow these instructions at home: ?For the time period you were told by your health care provider: ? ?  ? ?Rest as needed. ?Do not participate in activities where  you could fall or become injured. ?Do not drive or use machinery. ?Do not drink alcohol. ?Do not take sleeping pills or medicines that cause drowsiness. ?Do not make important decisions or sign legal docu

## 2021-11-19 ENCOUNTER — Encounter (HOSPITAL_COMMUNITY)
Admission: RE | Admit: 2021-11-19 | Discharge: 2021-11-19 | Disposition: A | Discharge status: Home / Self Care | Payer: Medicare Other | Source: Ambulatory Visit | Attending: Internal Medicine | Admitting: Internal Medicine

## 2021-11-19 VITALS — BP 103/69 | HR 96 | Temp 97.5°F | Resp 18 | Ht 74.0 in | Wt 141.8 lb

## 2021-11-19 DIAGNOSIS — F1027 Alcohol dependence with alcohol-induced persisting dementia: Secondary | ICD-10-CM | POA: Diagnosis not present

## 2021-11-19 DIAGNOSIS — K625 Hemorrhage of anus and rectum: Secondary | ICD-10-CM | POA: Insufficient documentation

## 2021-11-19 DIAGNOSIS — G9349 Other encephalopathy: Secondary | ICD-10-CM | POA: Diagnosis not present

## 2021-11-19 DIAGNOSIS — F10288 Alcohol dependence with other alcohol-induced disorder: Secondary | ICD-10-CM | POA: Insufficient documentation

## 2021-11-19 DIAGNOSIS — Z01812 Encounter for preprocedural laboratory examination: Secondary | ICD-10-CM | POA: Insufficient documentation

## 2021-11-19 LAB — COMPREHENSIVE METABOLIC PANEL
ALT: 8 U/L (ref 0–44)
AST: 17 U/L (ref 15–41)
Albumin: 3.6 g/dL (ref 3.5–5.0)
Alkaline Phosphatase: 40 U/L (ref 38–126)
Anion gap: 8 (ref 5–15)
BUN: 14 mg/dL (ref 8–23)
CO2: 27 mmol/L (ref 22–32)
Calcium: 9.1 mg/dL (ref 8.9–10.3)
Chloride: 105 mmol/L (ref 98–111)
Creatinine, Ser: 0.93 mg/dL (ref 0.61–1.24)
GFR, Estimated: >60 mL/min (ref >60)
Glucose, Bld: 97 mg/dL (ref 70–99)
Potassium: 3.5 mmol/L (ref 3.5–5.1)
Sodium: 140 mmol/L (ref 135–145)
Total Bilirubin: 0.4 mg/dL (ref 0.3–1.2)
Total Protein: 6.8 g/dL (ref 6.5–8.1)

## 2021-11-19 LAB — CBC WITH DIFFERENTIAL/PLATELET
Abs Immature Granulocytes: 0.01 10*3/uL (ref 0.00–0.07)
Basophils Absolute: 0 10*3/uL (ref 0.0–0.1)
Basophils Relative: 0 %
Eosinophils Absolute: 0 10*3/uL (ref 0.0–0.5)
Eosinophils Relative: 1 %
HCT: 32.5 % — ABNORMAL LOW (ref 39.0–52.0)
Hemoglobin: 10.4 g/dL — ABNORMAL LOW (ref 13.0–17.0)
Immature Granulocytes: 0 %
Lymphocytes Relative: 37 %
Lymphs Abs: 1.8 10*3/uL (ref 0.7–4.0)
MCH: 30.3 pg (ref 26.0–34.0)
MCHC: 32 g/dL (ref 30.0–36.0)
MCV: 94.8 fL (ref 80.0–100.0)
Monocytes Absolute: 0.5 10*3/uL (ref 0.1–1.0)
Monocytes Relative: 10 %
Neutro Abs: 2.5 10*3/uL (ref 1.7–7.7)
Neutrophils Relative %: 52 %
Platelets: 334 10*3/uL (ref 150–400)
RBC: 3.43 MIL/uL — ABNORMAL LOW (ref 4.22–5.81)
RDW: 14.6 % (ref 11.5–15.5)
WBC: 4.9 10*3/uL (ref 4.0–10.5)
nRBC: 0 % (ref 0.0–0.2)

## 2021-11-22 ENCOUNTER — Encounter (HOSPITAL_COMMUNITY): Payer: Self-pay

## 2021-11-22 ENCOUNTER — Encounter (HOSPITAL_COMMUNITY)
Admission: RE | Disposition: A | Discharge status: Home / Self Care | Payer: Self-pay | Source: Home / Self Care | Attending: Internal Medicine

## 2021-11-22 ENCOUNTER — Ambulatory Visit (HOSPITAL_BASED_OUTPATIENT_CLINIC_OR_DEPARTMENT_OTHER): Payer: Medicare Other | Admitting: Anesthesiology

## 2021-11-22 ENCOUNTER — Ambulatory Visit (HOSPITAL_COMMUNITY)
Admission: RE | Admit: 2021-11-22 | Discharge: 2021-11-22 | Disposition: A | Discharge status: Home / Self Care | Payer: Medicare Other | Attending: Internal Medicine | Admitting: Internal Medicine

## 2021-11-22 ENCOUNTER — Ambulatory Visit (HOSPITAL_COMMUNITY): Payer: Medicare Other | Admitting: Anesthesiology

## 2021-11-22 DIAGNOSIS — K635 Polyp of colon: Secondary | ICD-10-CM | POA: Diagnosis not present

## 2021-11-22 DIAGNOSIS — K5730 Diverticulosis of large intestine without perforation or abscess without bleeding: Secondary | ICD-10-CM | POA: Diagnosis not present

## 2021-11-22 DIAGNOSIS — K219 Gastro-esophageal reflux disease without esophagitis: Secondary | ICD-10-CM | POA: Insufficient documentation

## 2021-11-22 DIAGNOSIS — K2970 Gastritis, unspecified, without bleeding: Secondary | ICD-10-CM

## 2021-11-22 DIAGNOSIS — D124 Benign neoplasm of descending colon: Secondary | ICD-10-CM | POA: Diagnosis not present

## 2021-11-22 DIAGNOSIS — R12 Heartburn: Secondary | ICD-10-CM | POA: Insufficient documentation

## 2021-11-22 DIAGNOSIS — K2950 Unspecified chronic gastritis without bleeding: Secondary | ICD-10-CM | POA: Insufficient documentation

## 2021-11-22 DIAGNOSIS — F17210 Nicotine dependence, cigarettes, uncomplicated: Secondary | ICD-10-CM | POA: Diagnosis not present

## 2021-11-22 DIAGNOSIS — K2980 Duodenitis without bleeding: Secondary | ICD-10-CM

## 2021-11-22 DIAGNOSIS — I10 Essential (primary) hypertension: Secondary | ICD-10-CM | POA: Diagnosis not present

## 2021-11-22 DIAGNOSIS — K449 Diaphragmatic hernia without obstruction or gangrene: Secondary | ICD-10-CM

## 2021-11-22 DIAGNOSIS — K2990 Gastroduodenitis, unspecified, without bleeding: Secondary | ICD-10-CM | POA: Diagnosis not present

## 2021-11-22 DIAGNOSIS — K648 Other hemorrhoids: Secondary | ICD-10-CM | POA: Insufficient documentation

## 2021-11-22 DIAGNOSIS — R933 Abnormal findings on diagnostic imaging of other parts of digestive tract: Secondary | ICD-10-CM | POA: Insufficient documentation

## 2021-11-22 DIAGNOSIS — F0390 Unspecified dementia without behavioral disturbance: Secondary | ICD-10-CM | POA: Insufficient documentation

## 2021-11-22 DIAGNOSIS — K222 Esophageal obstruction: Secondary | ICD-10-CM

## 2021-11-22 HISTORY — PX: POLYPECTOMY: SHX5525

## 2021-11-22 HISTORY — PX: COLONOSCOPY WITH PROPOFOL: SHX5780

## 2021-11-22 HISTORY — PX: ESOPHAGOGASTRODUODENOSCOPY (EGD) WITH PROPOFOL: SHX5813

## 2021-11-22 HISTORY — PX: BIOPSY: SHX5522

## 2021-11-22 SURGERY — COLONOSCOPY WITH PROPOFOL
Anesthesia: General

## 2021-11-22 MED ORDER — PROPOFOL 10 MG/ML IV BOLUS
INTRAVENOUS | Status: DC | PRN
  Administered 2021-11-22 (×2): 100 mg via INTRAVENOUS

## 2021-11-22 MED ORDER — OMEPRAZOLE 40 MG PO CPDR: 40.0000 mg | DELAYED_RELEASE_CAPSULE | Freq: Every day | ORAL | 11 refills | Status: DC

## 2021-11-22 MED ORDER — PROPOFOL 500 MG/50ML IV EMUL
INTRAVENOUS | Status: DC | PRN
  Administered 2021-11-22: 125 ug/kg/min via INTRAVENOUS

## 2021-11-22 MED ORDER — LACTATED RINGERS IV SOLN
INTRAVENOUS | Status: DC
  Administered 2021-11-22: 1000 mL via INTRAVENOUS

## 2021-11-22 MED ORDER — LIDOCAINE HCL (CARDIAC) PF 100 MG/5ML IV SOSY
PREFILLED_SYRINGE | INTRAVENOUS | Status: DC | PRN
  Administered 2021-11-22 (×2): 50 mg via INTRAVENOUS

## 2021-11-22 NOTE — Transfer of Care (Signed)
Immediate Anesthesia Transfer of Care Note ? ?Patient: Scott Jackson ? ?Procedure(s) Performed: COLONOSCOPY WITH PROPOFOL ?ESOPHAGOGASTRODUODENOSCOPY (EGD) WITH PROPOFOL ?BIOPSY ?POLYPECTOMY ? ?Patient Location: Short Stay ? ?Anesthesia Type:General ? ?Level of Consciousness: drowsy ? ?Airway & Oxygen Therapy: Patient Spontanous Breathing ? ?Post-op Assessment: Report given to RN and Post -op Vital signs reviewed and stable ? ?Post vital signs: Reviewed and stable ? ?Last Vitals:  ?Vitals Value Taken Time  ?BP    ?Temp    ?Pulse    ?Resp    ?SpO2    ? ? ?Last Pain:  ?Vitals:  ? 11/22/21 1347  ?TempSrc:   ?PainSc: 0-No pain  ?   ? ?Patients Stated Pain Goal: 5 (11/22/21 1301) ? ?Complications: No notable events documented. ?

## 2021-11-22 NOTE — Op Note (Signed)
[facility] ?Patient Name: Scott Jackson ?Procedure Date: 11/22/2021 1:42 PM ?MRN: [id number] ?Date of Birth: 08-Apr-1951 ?Attending MD: Elon Alas. Abbey Chatters , DO ?CSN: [id number] ?Age: 71 ?Admit Type: Outpatient ?Procedure:                Upper GI endoscopy ?Indications:              Heartburn, Abnormal PET scan of the GI tract ?Providers:                Elon Alas. Abbey Chatters, DO, Tammy Vaught, RN, Crisann  ?                          Wynonia Lawman, Technician ?Referring MD:              ?Medicines:                See the Anesthesia note for documentation of the  ?                          administered medications ?Complications:            No immediate complications. ?Estimated Blood Loss:     Estimated blood loss was minimal. ?Procedure:                Pre-Anesthesia Assessment: ?                          - The anesthesia plan was to use monitored  ?                          anesthesia care (MAC). ?                          After obtaining informed consent, the endoscope was  ?                          passed under direct vision. Throughout the  ?                          procedure, the patient's blood pressure, pulse, and  ?                          oxygen saturations were monitored continuously. The  ?                          GIF-H190 ([id number]) scope was introduced through the  ?                          mouth, and advanced to the second part of duodenum.  ?                          The upper GI endoscopy was accomplished without  ?                          difficulty. The patient tolerated the procedure  ?  well. ?Scope In: 1:52:19 PM ?Scope Out: 1:55:51 PM ?Total Procedure Duration: 0 hours 3 minutes 32 seconds  ?Findings: ?     A 2 cm hiatal hernia was present. ?     A mild Schatzki ring was found in the distal esophagus. ?     Patchy mild inflammation characterized by erythema was found in the  ?     entire examined stomach. Biopsies were taken with a cold forceps for  ?     Helicobacter pylori  testing. ?     Localized mild inflammation characterized by erythema was found in the  ?     duodenal bulb. ?     The second portion of the duodenum was normal. ?Impression:               - 2 cm hiatal hernia. ?                          - Mild Schatzki ring. ?                          - Gastritis. Biopsied. ?                          - Duodenitis. ?                          - Normal second portion of the duodenum. ?Moderate Sedation: ?     Per Anesthesia Care ?Recommendation:           - Patient has a contact number available for  ?                          emergencies. The signs and symptoms of potential  ?                          delayed complications were discussed with the  ?                          patient. Return to normal activities tomorrow.  ?                          Written discharge instructions were provided to the  ?                          patient. ?                          - Resume previous diet. ?                          - Continue present medications. ?                          - Await pathology results. ?                          - Use a proton pump inhibitor PO daily. ?Procedure Code(s):        --- Professional --- ?  29798, Esophagogastroduodenoscopy, flexible,  ?                          transoral; with biopsy, single or multiple ?Diagnosis Code(s):        --- Professional --- ?                          K44.9, Diaphragmatic hernia without obstruction or  ?                          gangrene ?                          K22.2, Esophageal obstruction ?                          K29.70, Gastritis, unspecified, without bleeding ?                          K29.80, Duodenitis without bleeding ?                          R12, Heartburn ?                          R93.3, Abnormal findings on diagnostic imaging of  ?                          other parts of digestive tract ?CPT copyright 2019 American Medical Association. All rights reserved. ?The codes documented in this report are  preliminary and upon coder review may  ?be revised to meet current compliance requirements. ?Elon Alas. Abbey Chatters, DO ?Elon Alas. [place], DO ?11/22/2021 1:58:23 PM ?This report has been signed electronically. ?Number of Addenda: 0 ?

## 2021-11-22 NOTE — Discharge Instructions (Addendum)
EGD ?Discharge instructions ?Please read the instructions outlined below and refer to this sheet in the next few weeks. These discharge instructions provide you with general information on caring for yourself after you leave the hospital. Your doctor may also give you specific instructions. While your treatment has been planned according to the most current medical practices available, unavoidable complications occasionally occur. If you have any problems or questions after discharge, please call your doctor. ?ACTIVITY ?You may resume your regular activity but move at a slower pace for the next 24 hours.  ?Take frequent rest periods for the next 24 hours.  ?Walking will help expel (get rid of) the air and reduce the bloated feeling in your abdomen.  ?No driving for 24 hours (because of the anesthesia (medicine) used during the test).  ?You may shower.  ?Do not sign any important legal documents or operate any machinery for 24 hours (because of the anesthesia used during the test).  ?NUTRITION ?Drink plenty of fluids.  ?You may resume your normal diet.  ?Begin with a light meal and progress to your normal diet.  ?Avoid alcoholic beverages for 24 hours or as instructed by your caregiver.  ?MEDICATIONS ?You may resume your normal medications unless your caregiver tells you otherwise.  ?WHAT YOU CAN EXPECT TODAY ?You may experience abdominal discomfort such as a feeling of fullness or ?gas? pains.  ?FOLLOW-UP ?Your doctor will discuss the results of your test with you.  ?SEEK IMMEDIATE MEDICAL ATTENTION IF ANY OF THE FOLLOWING OCCUR: ?Excessive nausea (feeling sick to your stomach) and/or vomiting.  ?Severe abdominal pain and distention (swelling).  ?Trouble swallowing.  ?Temperature over 101? F (37.8? C).  ?Rectal bleeding or vomiting of blood.  ? ? ? ?Colonoscopy ?Discharge Instructions ? ?Read the instructions outlined below and refer to this sheet in the next few weeks. These discharge instructions provide you with  general information on caring for yourself after you leave the hospital. Your doctor may also give you specific instructions. While your treatment has been planned according to the most current medical practices available, unavoidable complications occasionally occur.  ? ?ACTIVITY ?You may resume your regular activity, but move at a slower pace for the next 24 hours.  ?Take frequent rest periods for the next 24 hours.  ?Walking will help get rid of the air and reduce the bloated feeling in your belly (abdomen).  ?No driving for 24 hours (because of the medicine (anesthesia) used during the test).   ?Do not sign any important legal documents or operate any machinery for 24 hours (because of the anesthesia used during the test).  ?NUTRITION ?Drink plenty of fluids.  ?You may resume your normal diet as instructed by your doctor.  ?Begin with a light meal and progress to your normal diet. Heavy or fried foods are harder to digest and may make you feel sick to your stomach (nauseated).  ?Avoid alcoholic beverages for 24 hours or as instructed.  ?MEDICATIONS ?You may resume your normal medications unless your doctor tells you otherwise.  ?WHAT YOU CAN EXPECT TODAY ?Some feelings of bloating in the abdomen.  ?Passage of more gas than usual.  ?Spotting of blood in your stool or on the toilet paper.  ?IF YOU HAD POLYPS REMOVED DURING THE COLONOSCOPY: ?No aspirin products for 7 days or as instructed.  ?No alcohol for 7 days or as instructed.  ?Eat a soft diet for the next 24 hours.  ?FINDING OUT THE RESULTS OF YOUR TEST ?Not all test results are  available during your visit. If your test results are not back during the visit, make an appointment with your caregiver to find out the results. Do not assume everything is normal if you have not heard from your caregiver or the medical facility. It is important for you to follow up on all of your test results.  ?SEEK IMMEDIATE MEDICAL ATTENTION IF: ?You have more than a spotting of  blood in your stool.  ?Your belly is swollen (abdominal distention).  ?You are nauseated or vomiting.  ?You have a temperature over 101.  ?You have abdominal pain or discomfort that is severe or gets worse throughout the day.  ? ?Your EGD revealed mild amount inflammation in your stomach.  I took biopsies of this to rule out infection with a bacteria called H. pylori.  Await pathology results, my office will contact you. I am going to restart your Omeprazole 40 mg daily and have sent this to your pharmacy.  ? ?Your colonoscopy revealed 1 polyp(s) which I removed successfully. Await pathology results, my office will contact you. I recommend repeating colonoscopy in 5 years for surveillance purposes. You also have diverticulosis and internal hemorrhoids. I would recommend increasing fiber in your diet or adding OTC Benefiber/Metamucil. Be sure to drink at least 4 to 6 glasses of water daily.  ? ?Follow-up with GI in 3-4 months ? ? ? ? ?I hope you have a great rest of your week! ? ?Elon Alas. Abbey Chatters, D.O. ?Gastroenterology and Hepatology ?[facility] ? ?

## 2021-11-22 NOTE — Op Note (Signed)
[facility] ?Patient Name: Scott Jackson ?Procedure Date: 11/22/2021 1:57 PM ?MRN: [id number] ?Date of Birth: Aug 26, 1950 ?Attending MD: Elon Alas. Abbey Chatters , DO ?CSN: [id number] ?Age: 71 ?Admit Type: Outpatient ?Procedure:                Colonoscopy ?Indications:              Abnormal CT of the GI tract ?Providers:                Elon Alas. Abbey Chatters, DO, Tammy Vaught, RN, Crisann  ?                          Wynonia Lawman, Technician ?Referring MD:              ?Medicines:                See the Anesthesia note for documentation of the  ?                          administered medications ?Complications:            No immediate complications. ?Estimated Blood Loss:     Estimated blood loss was minimal. ?Procedure:                Pre-Anesthesia Assessment: ?                          - The anesthesia plan was to use monitored  ?                          anesthesia care (MAC). ?                          After obtaining informed consent, the colonoscope  ?                          was passed under direct vision. Throughout the  ?                          procedure, the patient's blood pressure, pulse, and  ?                          oxygen saturations were monitored continuously. The  ?                          PCF-HQ190L ([id number]) scope was introduced through  ?                          the anus and advanced to the the cecum, identified  ?                          by appendiceal orifice and ileocecal valve. The  ?                          colonoscopy was performed without difficulty. The  ?                          patient tolerated the procedure well.  The quality  ?                          of the bowel preparation was evaluated using the  ?                          BBPS [facility] Bowel Preparation Scale) with scores  ?                          of: Right Colon = 3, Transverse Colon = 3 and Left  ?                          Colon = 3 (entire mucosa seen well with no residual  ?                          staining, small fragments of  stool or opaque  ?                          liquid). The total BBPS score equals 9. ?Scope In: 1:59:17 PM ?Scope Out: 2:12:44 PM ?Scope Withdrawal Time: 0 hours 7 minutes 35 seconds  ?Total Procedure Duration: 0 hours 13 minutes 27 seconds  ?Findings: ?     The perianal and digital rectal examinations were normal. ?     Non-bleeding internal hemorrhoids were found during endoscopy. ?     Many small and large-mouthed diverticula were found in the sigmoid  ?     colon, descending colon and transverse colon. ?     A 4 mm polyp was found in the descending colon. The polyp was sessile.  ?     The polyp was removed with a cold snare. Resection and retrieval were  ?     complete. ?     The exam was otherwise without abnormality. ?Impression:               - Non-bleeding internal hemorrhoids. ?                          - Diverticulosis in the sigmoid colon, in the  ?                          descending colon and in the transverse colon. ?                          - One 4 mm polyp in the descending colon, removed  ?                          with a cold snare. Resected and retrieved. ?                          - The examination was otherwise normal. ?Moderate Sedation: ?     Per Anesthesia Care ?Recommendation:           - Patient has a contact number available for  ?                          emergencies. The signs and  symptoms of potential  ?                          delayed complications were discussed with the  ?                          patient. Return to normal activities tomorrow.  ?                          Written discharge instructions were provided to the  ?                          patient. ?                          - Resume previous diet. ?                          - Continue present medications. ?                          - Await pathology results. ?                          - Repeat colonoscopy in 5 years for surveillance. ?                          - Return to GI clinic in 4 months. ?Procedure Code(s):         --- Professional --- ?                          [phone], Colonoscopy, flexible; with removal of  ?                          tumor(s), polyp(s), or other lesion(s) by snare  ?                          technique ?Diagnosis Code(s):        --- Professional --- ?                          K55.3, Other hemorrhoids ?                          K63.5, Polyp of colon ?                          K57.30, Diverticulosis of large intestine without  ?                          perforation or abscess without bleeding ?                          R93.3, Abnormal findings on diagnostic imaging of  ?                          other parts of digestive tract ?CPT copyright 2019 American  Medical Association. All rights reserved. ?The codes documented in this report are preliminary and upon coder review may  ?be revised to meet current compliance requirements. ?Elon Alas. Abbey Chatters, DO ?Elon Alas. [place], DO ?11/22/2021 2:15:21 PM ?This report has been signed electronically. ?Number of Addenda: 0 ?

## 2021-11-22 NOTE — Interval H&P Note (Signed)
History and Physical Interval Note: ? ?11/22/2021 ?1:06 PM ? ?Scott Jackson V  has presented today for surgery, with the diagnosis of abnormal gastrointestinal PET scan, abnormal CT scan colon, GERD.  The various methods of treatment have been discussed with the patient and family. After consideration of risks, benefits and other options for treatment, the patient has consented to  Procedure(s) with comments: ?COLONOSCOPY WITH PROPOFOL (N/A) - 2:30pm ?ESOPHAGOGASTRODUODENOSCOPY (EGD) WITH PROPOFOL (N/A) as a surgical intervention.  The patient's history has been reviewed, patient examined, no change in status, stable for surgery.  I have reviewed the patient's chart and labs.  Questions were answered to the patient's satisfaction.   ? ? ?Scott Jackson ? ? ?

## 2021-11-22 NOTE — Anesthesia Preprocedure Evaluation (Signed)
Anesthesia Evaluation  ?Patient identified by MRN, date of birth, ID band ?Patient awake ? ? ? ?Reviewed: ?Allergy & Precautions, H&P , NPO status , Patient's Chart, lab work & pertinent test results, reviewed documented beta blocker date and time  ? ?Airway ?Mallampati: II ? ?TM Distance: >3 FB ?Neck ROM: full ? ? ? Dental ?no notable dental hx. ? ?  ?Pulmonary ?neg pulmonary ROS, Current Smoker,  ?  ?Pulmonary exam normal ?breath sounds clear to auscultation ? ? ? ? ? ? Cardiovascular ?Exercise Tolerance: Good ?hypertension, negative cardio ROS ? ? ?Rhythm:regular Rate:Normal ? ? ?  ?Neuro/Psych ?PSYCHIATRIC DISORDERS Dementia negative neurological ROS ?   ? GI/Hepatic ?negative GI ROS, Neg liver ROS,   ?Endo/Other  ?negative endocrine ROS ? Renal/GU ?  ?negative genitourinary ?  ?Musculoskeletal ? ? Abdominal ?  ?Peds ? Hematology ?negative hematology ROS ?(+)   ?Anesthesia Other Findings ? ? Reproductive/Obstetrics ?negative OB ROS ? ?  ? ? ? ? ? ? ? ? ? ? ? ? ? ?  ?  ? ? ? ? ? ? ? ? ?Anesthesia Physical ?Anesthesia Plan ? ?ASA: 3 ? ?Anesthesia Plan: General  ? ?Post-op Pain Management:   ? ?Induction:  ? ?PONV Risk Score and Plan: Propofol infusion ? ?Airway Management Planned:  ? ?Additional Equipment:  ? ?Intra-op Plan:  ? ?Post-operative Plan:  ? ?Informed Consent: I have reviewed the patients History and Physical, chart, labs and discussed the procedure including the risks, benefits and alternatives for the proposed anesthesia with the patient or authorized representative who has indicated his/her understanding and acceptance.  ? ? ? ?Dental Advisory Given ? ?Plan Discussed with: CRNA ? ?Anesthesia Plan Comments:   ? ? ? ? ? ? ?Anesthesia Quick Evaluation ? ?

## 2021-11-23 NOTE — Anesthesia Postprocedure Evaluation (Signed)
Anesthesia Post Note ? ?Patient: Scott Jackson ? ?Procedure(s) Performed: COLONOSCOPY WITH PROPOFOL ?ESOPHAGOGASTRODUODENOSCOPY (EGD) WITH PROPOFOL ?BIOPSY ?POLYPECTOMY ? ?Patient location during evaluation: Phase II ?Anesthesia Type: General ?Level of consciousness: awake ?Pain management: pain level controlled ?Vital Signs Assessment: post-procedure vital signs reviewed and stable ?Respiratory status: spontaneous breathing and respiratory function stable ?Cardiovascular status: blood pressure returned to baseline and stable ?Postop Assessment: no headache and no apparent nausea or vomiting ?Anesthetic complications: no ?Comments: Late entry ? ? ?No notable events documented. ? ? ?Last Vitals:  ?Vitals:  ? 11/22/21 1301 11/22/21 1417  ?BP: (!) 189/95 108/60  ?Pulse: 60 64  ?Resp: 20 15  ?Temp: 36.5 ?C 36.5 ?C  ?SpO2: 100% 93%  ?  ?Last Pain:  ?Vitals:  ? 11/22/21 1417  ?TempSrc: Oral  ?PainSc: 0-No pain  ? ? ?  ?  ?  ?  ?  ?  ? ?Louann Sjogren ? ? ? ? ?

## 2021-11-24 LAB — SURGICAL PATHOLOGY

## 2021-11-26 DIAGNOSIS — G9341 Metabolic encephalopathy: Secondary | ICD-10-CM | POA: Diagnosis not present

## 2021-11-26 DIAGNOSIS — I10 Essential (primary) hypertension: Secondary | ICD-10-CM | POA: Diagnosis not present

## 2021-11-26 DIAGNOSIS — G40309 Generalized idiopathic epilepsy and epileptic syndromes, not intractable, without status epilepticus: Secondary | ICD-10-CM | POA: Diagnosis not present

## 2021-11-26 DIAGNOSIS — F1010 Alcohol abuse, uncomplicated: Secondary | ICD-10-CM | POA: Diagnosis not present

## 2021-11-26 DIAGNOSIS — R627 Adult failure to thrive: Secondary | ICD-10-CM | POA: Diagnosis not present

## 2021-11-26 DIAGNOSIS — Z9181 History of falling: Secondary | ICD-10-CM | POA: Diagnosis not present

## 2021-11-26 DIAGNOSIS — Z91148 Patient's other noncompliance with medication regimen for other reason: Secondary | ICD-10-CM | POA: Diagnosis not present

## 2021-11-26 DIAGNOSIS — F1097 Alcohol use, unspecified with alcohol-induced persisting dementia: Secondary | ICD-10-CM | POA: Diagnosis not present

## 2021-11-26 DIAGNOSIS — F17210 Nicotine dependence, cigarettes, uncomplicated: Secondary | ICD-10-CM | POA: Diagnosis not present

## 2021-11-26 DIAGNOSIS — Z7951 Long term (current) use of inhaled steroids: Secondary | ICD-10-CM | POA: Diagnosis not present

## 2021-11-26 DIAGNOSIS — J439 Emphysema, unspecified: Secondary | ICD-10-CM | POA: Diagnosis not present

## 2021-11-26 DIAGNOSIS — R911 Solitary pulmonary nodule: Secondary | ICD-10-CM | POA: Diagnosis not present

## 2021-11-29 ENCOUNTER — Encounter (HOSPITAL_COMMUNITY): Payer: Self-pay | Admitting: Internal Medicine

## 2022-01-03 ENCOUNTER — Ambulatory Visit (HOSPITAL_COMMUNITY): Admission: RE | Admit: 2022-01-03 | Payer: Medicare Other | Source: Ambulatory Visit

## 2022-01-06 ENCOUNTER — Observation Stay (HOSPITAL_COMMUNITY)
Admission: EM | Admit: 2022-01-06 | Discharge: 2022-01-08 | Disposition: A | Discharge status: Home Health | Payer: Medicare Other | Attending: Family Medicine | Admitting: Family Medicine

## 2022-01-06 ENCOUNTER — Emergency Department (HOSPITAL_COMMUNITY): Payer: Medicare Other

## 2022-01-06 ENCOUNTER — Encounter (HOSPITAL_COMMUNITY): Payer: Self-pay | Admitting: Emergency Medicine

## 2022-01-06 ENCOUNTER — Other Ambulatory Visit: Payer: Self-pay

## 2022-01-06 DIAGNOSIS — F17210 Nicotine dependence, cigarettes, uncomplicated: Secondary | ICD-10-CM | POA: Insufficient documentation

## 2022-01-06 DIAGNOSIS — F1010 Alcohol abuse, uncomplicated: Secondary | ICD-10-CM | POA: Diagnosis present

## 2022-01-06 DIAGNOSIS — I10 Essential (primary) hypertension: Secondary | ICD-10-CM

## 2022-01-06 DIAGNOSIS — R680 Hypothermia, not associated with low environmental temperature: Secondary | ICD-10-CM | POA: Diagnosis not present

## 2022-01-06 DIAGNOSIS — R4020 Unspecified coma: Secondary | ICD-10-CM | POA: Diagnosis not present

## 2022-01-06 DIAGNOSIS — G9341 Metabolic encephalopathy: Secondary | ICD-10-CM | POA: Diagnosis present

## 2022-01-06 DIAGNOSIS — D649 Anemia, unspecified: Secondary | ICD-10-CM | POA: Diagnosis not present

## 2022-01-06 DIAGNOSIS — R770 Abnormality of albumin: Secondary | ICD-10-CM | POA: Insufficient documentation

## 2022-01-06 DIAGNOSIS — E860 Dehydration: Secondary | ICD-10-CM | POA: Diagnosis not present

## 2022-01-06 DIAGNOSIS — R55 Syncope and collapse: Secondary | ICD-10-CM | POA: Diagnosis not present

## 2022-01-06 DIAGNOSIS — E8809 Other disorders of plasma-protein metabolism, not elsewhere classified: Secondary | ICD-10-CM

## 2022-01-06 DIAGNOSIS — Z91148 Patient's other noncompliance with medication regimen for other reason: Secondary | ICD-10-CM | POA: Diagnosis not present

## 2022-01-06 DIAGNOSIS — R464 Slowness and poor responsiveness: Secondary | ICD-10-CM | POA: Diagnosis not present

## 2022-01-06 DIAGNOSIS — R231 Pallor: Secondary | ICD-10-CM | POA: Diagnosis not present

## 2022-01-06 DIAGNOSIS — E876 Hypokalemia: Secondary | ICD-10-CM

## 2022-01-06 DIAGNOSIS — Z79899 Other long term (current) drug therapy: Secondary | ICD-10-CM | POA: Diagnosis not present

## 2022-01-06 DIAGNOSIS — J449 Chronic obstructive pulmonary disease, unspecified: Secondary | ICD-10-CM | POA: Diagnosis not present

## 2022-01-06 DIAGNOSIS — G40909 Epilepsy, unspecified, not intractable, without status epilepticus: Secondary | ICD-10-CM

## 2022-01-06 DIAGNOSIS — F0390 Unspecified dementia without behavioral disturbance: Secondary | ICD-10-CM | POA: Insufficient documentation

## 2022-01-06 DIAGNOSIS — R4189 Other symptoms and signs involving cognitive functions and awareness: Secondary | ICD-10-CM

## 2022-01-06 DIAGNOSIS — N179 Acute kidney failure, unspecified: Secondary | ICD-10-CM | POA: Diagnosis not present

## 2022-01-06 DIAGNOSIS — J439 Emphysema, unspecified: Secondary | ICD-10-CM | POA: Diagnosis present

## 2022-01-06 DIAGNOSIS — R4182 Altered mental status, unspecified: Secondary | ICD-10-CM | POA: Diagnosis not present

## 2022-01-06 DIAGNOSIS — R404 Transient alteration of awareness: Secondary | ICD-10-CM | POA: Diagnosis not present

## 2022-01-06 DIAGNOSIS — T68XXXA Hypothermia, initial encounter: Secondary | ICD-10-CM | POA: Diagnosis present

## 2022-01-06 DIAGNOSIS — I959 Hypotension, unspecified: Secondary | ICD-10-CM | POA: Diagnosis not present

## 2022-01-06 DIAGNOSIS — K219 Gastro-esophageal reflux disease without esophagitis: Secondary | ICD-10-CM

## 2022-01-06 LAB — BASIC METABOLIC PANEL
Anion gap: 12 (ref 5–15)
BUN: 14 mg/dL (ref 8–23)
CO2: 20 mmol/L — ABNORMAL LOW (ref 22–32)
Calcium: 8.5 mg/dL — ABNORMAL LOW (ref 8.9–10.3)
Chloride: 106 mmol/L (ref 98–111)
Creatinine, Ser: 1.56 mg/dL — ABNORMAL HIGH (ref 0.61–1.24)
GFR, Estimated: 47 mL/min — ABNORMAL LOW (ref >60)
Glucose, Bld: 129 mg/dL — ABNORMAL HIGH (ref 70–99)
Potassium: 3.2 mmol/L — ABNORMAL LOW (ref 3.5–5.1)
Sodium: 138 mmol/L (ref 135–145)

## 2022-01-06 LAB — CBC WITH DIFFERENTIAL/PLATELET
Abs Immature Granulocytes: 0.01 10*3/uL (ref 0.00–0.07)
Basophils Absolute: 0 10*3/uL (ref 0.0–0.1)
Basophils Relative: 0 %
Eosinophils Absolute: 0.1 10*3/uL (ref 0.0–0.5)
Eosinophils Relative: 1 %
HCT: 31.6 % — ABNORMAL LOW (ref 39.0–52.0)
Hemoglobin: 9.9 g/dL — ABNORMAL LOW (ref 13.0–17.0)
Immature Granulocytes: 0 %
Lymphocytes Relative: 19 %
Lymphs Abs: 1.3 10*3/uL (ref 0.7–4.0)
MCH: 29.5 pg (ref 26.0–34.0)
MCHC: 31.3 g/dL (ref 30.0–36.0)
MCV: 94 fL (ref 80.0–100.0)
Monocytes Absolute: 0.5 10*3/uL (ref 0.1–1.0)
Monocytes Relative: 7 %
Neutro Abs: 4.9 10*3/uL (ref 1.7–7.7)
Neutrophils Relative %: 73 %
Platelets: 220 10*3/uL (ref 150–400)
RBC: 3.36 MIL/uL — ABNORMAL LOW (ref 4.22–5.81)
RDW: 16.6 % — ABNORMAL HIGH (ref 11.5–15.5)
WBC: 6.7 10*3/uL (ref 4.0–10.5)
nRBC: 0 % (ref 0.0–0.2)

## 2022-01-06 LAB — HEPATIC FUNCTION PANEL
ALT: 7 U/L (ref 0–44)
AST: 14 U/L — ABNORMAL LOW (ref 15–41)
Albumin: 3.3 g/dL — ABNORMAL LOW (ref 3.5–5.0)
Alkaline Phosphatase: 30 U/L — ABNORMAL LOW (ref 38–126)
Bilirubin, Direct: 0.1 mg/dL (ref 0.0–0.2)
Indirect Bilirubin: 0.1 mg/dL — ABNORMAL LOW (ref 0.3–0.9)
Total Bilirubin: 0.2 mg/dL — ABNORMAL LOW (ref 0.3–1.2)
Total Protein: 6 g/dL — ABNORMAL LOW (ref 6.5–8.1)

## 2022-01-06 LAB — URINALYSIS, ROUTINE W REFLEX MICROSCOPIC
Bacteria, UA: NONE SEEN
Bilirubin Urine: NEGATIVE
Glucose, UA: NEGATIVE mg/dL
Hgb urine dipstick: NEGATIVE
Ketones, ur: NEGATIVE mg/dL
Leukocytes,Ua: NEGATIVE
Nitrite: NEGATIVE
Protein, ur: 30 mg/dL — AB
Specific Gravity, Urine: 1.015 (ref 1.005–1.030)
pH: 6 (ref 5.0–8.0)

## 2022-01-06 LAB — TROPONIN I (HIGH SENSITIVITY)
Troponin I (High Sensitivity): 4 ng/L (ref <18)
Troponin I (High Sensitivity): 6 ng/L (ref <18)

## 2022-01-06 LAB — D-DIMER, QUANTITATIVE: D-Dimer, Quant: 0.5 ug/mL-FEU (ref 0.00–0.50)

## 2022-01-06 LAB — RAPID URINE DRUG SCREEN, HOSP PERFORMED
Amphetamines: NOT DETECTED
Barbiturates: NOT DETECTED
Benzodiazepines: NOT DETECTED
Cocaine: NOT DETECTED
Opiates: NOT DETECTED
Tetrahydrocannabinol: NOT DETECTED

## 2022-01-06 LAB — AMMONIA: Ammonia: 22 umol/L (ref 9–35)

## 2022-01-06 LAB — ETHANOL: Alcohol, Ethyl (B): 219 mg/dL — ABNORMAL HIGH (ref <10)

## 2022-01-06 LAB — CBG MONITORING, ED: Glucose-Capillary: 131 mg/dL — ABNORMAL HIGH (ref 70–99)

## 2022-01-06 MED ORDER — SODIUM CHLORIDE 0.9 % IV BOLUS
1000.0000 mL | Freq: Once | INTRAVENOUS | Status: AC
  Administered 2022-01-06: 1000 mL via INTRAVENOUS

## 2022-01-06 MED ORDER — SODIUM CHLORIDE 0.9 % IV SOLN: Freq: Once | INTRAVENOUS | Status: AC

## 2022-01-06 MED ORDER — LEVETIRACETAM IN NACL 1000 MG/100ML IV SOLN
1000.0000 mg | Freq: Once | INTRAVENOUS | Status: AC
  Administered 2022-01-06: 1000 mg via INTRAVENOUS
  Filled 2022-01-06: qty 100

## 2022-01-06 NOTE — ED Provider Notes (Signed)
[facility] Provider Note   CSN: [id number] Arrival date & time: 01/06/22  2052     History  Chief Complaint  Patient presents with   Altered Mental Status    Cloyde Oregel is a 71 y.o. male with history of alcohol use, alcoholic dementia, COPD, seizures, failure to thrive, presenting from home by EMS with episode of unresponsiveness.  Reportedly was found laying in the yard by a neighbor friend.  When EMS arrived on scene the patient was unresponsive to them, both verbally and to painful stimuli.  His blood sugar was reportedly normal over 100 for EMS.  His initial blood pressure was 60/40.  He was given a liter of lactated Ringer's by EMS, with some minor improvement of his blood pressure, and upon arrival in the ER was now awake and able to speak.  Medical chart review shows that the patient has a prior history of medical admission for AKI, alcohol use, electrolyte abnormalities, and seizure disorder.  He was admitted in March for similar presentation of unresponsiveness at home.  He was found to have an acute kidney injury at that time likely prerenal to poor oral intake.  He also has a history of prolonged QT.  HPI     Home Medications Prior to Admission medications   Medication Sig Start Date End Date Taking? Authorizing Provider  acetaminophen (TYLENOL) 325 MG tablet Take 2 tablets (650 mg total) by mouth every 6 (six) hours as needed for mild pain (or Fever >/= 101). 01/10/21  Yes Emokpae, Courage, MD  albuterol (VENTOLIN HFA) 108 (90 Base) MCG/ACT inhaler Inhale 2 puffs into the lungs every 4 (four) hours as needed for wheezing or shortness of breath. 01/10/21  Yes Emokpae, Courage, MD  amLODipine (NORVASC) 10 MG tablet Take 1 tablet (10 mg total) by mouth daily. Patient not taking: Reported on 01/07/2022 01/10/21   Roxan Hockey, MD  chlordiazePOXIDE (LIBRIUM) 25 MG capsule Take 1 capsule (25 mg total) by mouth 4 (four) times daily as needed for  anxiety or withdrawal. Patient not taking: Reported on 11/17/2021 10/15/21   Manuella Ghazi, Pratik D, DO  fluticasone furoate-vilanterol (BREO ELLIPTA) 200-25 MCG/INH AEPB Inhale 1 puff into the lungs daily. Patient not taking: Reported on 01/07/2022 01/11/21   Roxan Hockey, MD  folic acid (FOLVITE) 1 MG tablet Take 1 tablet (1 mg total) by mouth daily. Patient not taking: Reported on 01/07/2022 01/11/21   Roxan Hockey, MD  gabapentin (NEURONTIN) 300 MG capsule 1 capsule Patient not taking: Reported on 10/14/2021    [provider]  hydrALAZINE (APRESOLINE) 50 MG tablet Take 50 mg by mouth 3 (three) times daily. Patient not taking: Reported on 01/07/2022 10/21/21   [provider]  isosorbide mononitrate (IMDUR) 30 MG 24 hr tablet Take 30 mg by mouth daily. Patient not taking: Reported on 01/07/2022 10/21/21   [provider]  levETIRAcetam (KEPPRA) 500 MG tablet Take 2 tablets (1,000 mg total) by mouth 2 (two) times daily. Patient not taking: Reported on 01/07/2022 09/28/21   Dohmeier, Asencion Partridge, MD  lisinopril-hydrochlorothiazide (ZESTORETIC) 20-12.5 MG tablet Take 1 tablet by mouth daily. Patient not taking: Reported on 01/07/2022 10/21/21   [provider]  Multiple Vitamin (MULTIVITAMIN WITH MINERALS) TABS tablet Take 1 tablet by mouth daily. Patient not taking: Reported on 01/07/2022 01/11/21   Roxan Hockey, MD  omeprazole (PRILOSEC) 40 MG capsule Take 1 capsule (40 mg total) by mouth daily. Patient not taking: Reported on 01/07/2022 11/22/21 11/22/22  Abbey Chatters,  Charles K, DO  pantoprazole (PROTONIX) 40 MG tablet Take 40 mg by mouth daily. Patient not taking: Reported on 01/07/2022 10/21/21   [provider]  PARoxetine (PAXIL) 40 MG tablet Take 40 mg by mouth daily. Patient not taking: Reported on 01/07/2022 10/21/21   [provider]  tamsulosin (FLOMAX) 0.4 MG CAPS capsule Take 1 capsule (0.4 mg total) by mouth daily. Patient not taking: Reported on  01/07/2022 01/10/21   Roxan Hockey, MD  thiamine 100 MG tablet Take 1 tablet (100 mg total) by mouth daily. Patient not taking: Reported on 01/07/2022 01/11/21   Roxan Hockey, MD      Allergies    Patient has no known allergies.    Review of Systems   Review of Systems  Physical Exam Updated Vital Signs BP (!) 146/68 (BP Location: Left Arm)   Pulse 66   Temp 98.6 F (37 C) (Oral)   Resp 12   Ht '6\' 2"'$  (1.88 m)   Wt 68.4 kg   SpO2 93%   BMI 19.36 kg/m  Physical Exam  ED Results / Procedures / Treatments   Labs (all labs ordered are listed, but only abnormal results are displayed) Labs Reviewed  BASIC METABOLIC PANEL - Abnormal; Notable for the following components:      Result Value   Potassium 3.2 (*)    CO2 20 (*)    Glucose, Bld 129 (*)    Creatinine, Ser 1.56 (*)    Calcium 8.5 (*)    GFR, Estimated 47 (*)    All other components within normal limits  CBC WITH DIFFERENTIAL/PLATELET - Abnormal; Notable for the following components:   RBC 3.36 (*)    Hemoglobin 9.9 (*)    HCT 31.6 (*)    RDW 16.6 (*)    All other components within normal limits  URINALYSIS, ROUTINE W REFLEX MICROSCOPIC - Abnormal; Notable for the following components:   Protein, ur 30 (*)    All other components within normal limits  ETHANOL - Abnormal; Notable for the following components:   Alcohol, Ethyl (B) 219 (*)    All other components within normal limits  HEPATIC FUNCTION PANEL - Abnormal; Notable for the following components:   Total Protein 6.0 (*)    Albumin 3.3 (*)    AST 14 (*)    Alkaline Phosphatase 30 (*)    Total Bilirubin 0.2 (*)    Indirect Bilirubin 0.1 (*)    All other components within normal limits  COMPREHENSIVE METABOLIC PANEL - Abnormal; Notable for the following components:   CO2 20 (*)    Calcium 7.8 (*)    Total Protein 5.3 (*)    Albumin 2.8 (*)    AST 11 (*)    Alkaline Phosphatase 26 (*)    Total Bilirubin 0.2 (*)    All other components within  normal limits  CBC - Abnormal; Notable for the following components:   RBC 3.26 (*)    Hemoglobin 9.4 (*)    HCT 30.0 (*)    RDW 16.2 (*)    All other components within normal limits  MAGNESIUM - Abnormal; Notable for the following components:   Magnesium 1.5 (*)    All other components within normal limits  CBG MONITORING, ED - Abnormal; Notable for the following components:   Glucose-Capillary 131 (*)    All other components within normal limits  MRSA NEXT GEN BY PCR, NASAL  D-DIMER, QUANTITATIVE  RAPID URINE DRUG SCREEN, HOSP PERFORMED  AMMONIA  CK  APTT  PHOSPHORUS  TROPONIN I (HIGH SENSITIVITY)  TROPONIN I (HIGH SENSITIVITY)    EKG EKG Interpretation  Date/Time:  Thursday January 06 2022 20:59:57 EDT Ventricular Rate:  68 PR Interval:  152 QRS Duration: 94 QT Interval:  448 QTC Calculation: 477 R Axis:   -20 Text Interpretation: Sinus rhythm Atrial premature complex Borderline left axis deviation Low voltage, precordial leads Confirmed by Octaviano Glow [id number]) on 01/06/2022 10:49:14 PM  Radiology CT Head Wo Contrast  Result Date: 01/06/2022 CLINICAL DATA:  Altered mental status. EXAM: CT HEAD WITHOUT CONTRAST TECHNIQUE: Contiguous axial images were obtained from the base of the skull through the vertex without intravenous contrast. RADIATION DOSE REDUCTION: This exam was performed according to the departmental dose-optimization program which includes automated exposure control, adjustment of the mA and/or kV according to patient size and/or use of iterative reconstruction technique. COMPARISON:  Head CT dated 10/12/2021. FINDINGS: Brain: Mild age-related atrophy and chronic microvascular ischemic changes. There is no acute intracranial hemorrhage. No mass effect midline shift. No extra-axial fluid collection. Vascular: No hyperdense vessel or unexpected calcification. Skull: Normal. Negative for fracture or focal lesion. Sinuses/Orbits: There is diffuse mucoperiosteal  thickening of paranasal sinuses. No air-fluid level. The mastoid air cells are clear. Other: None IMPRESSION: 1. No acute intracranial pathology. 2. Mild age-related atrophy and chronic microvascular ischemic changes. Electronically Signed   By: Anner Crete M.D.   On: 01/06/2022 21:41   DG Chest Portable 1 View  Result Date: 01/06/2022 CLINICAL DATA:  Syncope.  Altered mental status. EXAM: PORTABLE CHEST 1 VIEW COMPARISON:  Radiograph 10/12/2021, chest CT 09/13/2021 FINDINGS: Lower lung volumes from prior exam. The heart is normal in size for technique. No acute airspace disease. No pleural effusion or pneumothorax. No pulmonary edema. Pulmonary nodule on prior CT is not well demonstrated by radiograph. Callus formation about prior rib fractures of posterior right ribs, unchanged. IMPRESSION: Lower lung volumes from prior exam. No acute findings. Electronically Signed   By: Keith Rake M.D.   On: 01/06/2022 21:27    Procedures Procedures    Medications Ordered in ED Medications  enoxaparin (LOVENOX) injection 40 mg (40 mg Subcutaneous Given 01/07/22 0827)  0.9 %  sodium chloride infusion ( Intravenous New Bag/Given 01/07/22 0501)  Chlorhexidine Gluconate Cloth 2 % PADS 6 each (6 each Topical Given 01/07/22 0117)  feeding supplement (ENSURE ENLIVE / ENSURE PLUS) liquid 237 mL (237 mLs Oral Patient Refused/Not Given 01/07/22 0828)  pantoprazole (PROTONIX) EC tablet 80 mg (80 mg Oral Given 2/99/37 1696)  folic acid (FOLVITE) tablet 1 mg (1 mg Oral Given 01/07/22 0825)  multivitamin with minerals tablet 1 tablet (1 tablet Oral Given 01/07/22 0826)  albuterol (PROVENTIL) (2.5 MG/3ML) 0.083% nebulizer solution 3 mL (has no administration in time range)  fluticasone furoate-vilanterol (BREO ELLIPTA) 200-25 MCG/INH 1 puff (1 puff Inhalation Given 01/07/22 1048)  levETIRAcetam (KEPPRA) tablet 1,000 mg (1,000 mg Oral Given 01/07/22 0825)  LORazepam (ATIVAN) tablet 1-4 mg (1 mg Oral Given 01/07/22 0902)     Or  LORazepam (ATIVAN) injection 1-4 mg ( Intravenous See Alternative 01/07/22 0902)  thiamine tablet 100 mg (0 mg Oral Duplicate 7/89/38 1017)    Or  thiamine (B-1) injection 100 mg ( Intravenous See Alternative 01/07/22 0904)  sodium chloride 0.9 % bolus 1,000 mL (0 mLs Intravenous Stopped 01/06/22 2247)  sodium chloride 0.9 % bolus 1,000 mL (0 mLs Intravenous Stopped 01/06/22 2359)  0.9 %  sodium chloride infusion (0 mLs Intravenous Stopped 01/07/22  0000)  levETIRAcetam (KEPPRA) IVPB 1000 mg/100 mL premix (0 mg Intravenous Stopped 01/06/22 2359)  sodium chloride 0.9 % bolus 500 mL (500 mLs Intravenous New Bag/Given 01/07/22 0049)  potassium chloride SA (KLOR-CON M) CR tablet 40 mEq (40 mEq Oral Given 01/07/22 0308)  potassium chloride 10 mEq in 100 mL IVPB (10 mEq Intravenous New Bag/Given 01/07/22 0459)  magnesium sulfate IVPB 2 g 50 mL (2 g Intravenous New Bag/Given 01/07/22 0749)    ED Course/ Medical Decision Making/ A&P Clinical Course as of 01/07/22 1447  Thu Jan 06, 2022  2235 BP 90/60 [MT]  2235 Patient is now awake and mentating quite well, appears to be back to baseline.  Daughter is present at the bedside. [MT]  2248 Alcohol, Ethyl (B)(!): 219 [MT]  2333 Admitted to hospitalist, keppra ordered (home med) [MT]    Clinical Course User Index [MT] Mickal Meno, Carola Rhine, MD                           Medical Decision Making Amount and/or Complexity of Data Reviewed Labs: ordered. Decision-making details documented in ED Course. Radiology: ordered. ECG/medicine tests: ordered.  Risk Prescription drug management. Decision regarding hospitalization.   This patient presents to the ED with concern for hypotension, unresponsiveness. This involves an extensive number of treatment options, and is a complaint that carries with it a high risk of complications and morbidity.  The differential diagnosis includes dehydration vs anemia vs PE vs seizure vs other  Co-morbidities that complicate  the patient evaluation: chronic etoh use, at risk for seizures  Additional history obtained from EMS and patient's daughter  External records from outside source obtained and reviewed including March 2023 [facility] discharge summary - similar presentation and clinical course to today, suspected unresponsive episode related to possible seizure, with dehydration  I ordered and personally interpreted labs.  The pertinent results include:  etoh elevated,, ddimer 0.5, no anion gap, chronic anemia, troponin negative  I ordered imaging studies including dg chest, CT head I independently visualized and interpreted imaging which showed no acute CVA, no focal infiltrate I agree with the radiologist interpretation  The patient was maintained on a cardiac monitor.  I personally viewed and interpreted the cardiac monitored which showed an underlying rhythm of: NSR  Per my interpretation the patient's ECG shows no acute ischemic findings  I ordered medication including IV fluids for hypotension suspected to be related to poor oral intake, IV keppra for seizure prophylaxis (likely had a seizure earlier today) I have reviewed the patients home medicines and have made adjustments as needed  Test Considered: doubt acute PE - no sig elevation in ddimer   After the interventions noted above, I reevaluated the patient and found that they have: improved Blood pressure showing improvement with fluids, and patient awake and alert and back to baseline mental status  Social Determinants of Health:history of alcoholism - family discussion  Dispostion:  After consideration of the diagnostic results and the patients response to treatment, I feel that the patent would benefit from medical admission.         Final Clinical Impression(s) / ED Diagnoses Final diagnoses:  Unresponsiveness    Rx / DC Orders ED Discharge Orders     None         Elisha Mcgruder, Carola Rhine, MD 01/07/22 1452

## 2022-01-06 NOTE — ED Triage Notes (Signed)
Pt found unresponsive at home. Per EMS, pt became responsive en route. EMS states pt has strong smell of ETOH. Pt with hx of seizures and ETOH abuse. BP low 60's/40's per EMS. EMS gave pt 1062m LR PTA.

## 2022-01-06 NOTE — ED Notes (Signed)
Manual BP 80/62

## 2022-01-07 DIAGNOSIS — F1010 Alcohol abuse, uncomplicated: Secondary | ICD-10-CM

## 2022-01-07 DIAGNOSIS — E860 Dehydration: Secondary | ICD-10-CM

## 2022-01-07 DIAGNOSIS — E876 Hypokalemia: Secondary | ICD-10-CM

## 2022-01-07 DIAGNOSIS — G40909 Epilepsy, unspecified, not intractable, without status epilepticus: Secondary | ICD-10-CM

## 2022-01-07 DIAGNOSIS — N179 Acute kidney failure, unspecified: Secondary | ICD-10-CM

## 2022-01-07 DIAGNOSIS — I10 Essential (primary) hypertension: Secondary | ICD-10-CM

## 2022-01-07 DIAGNOSIS — G9341 Metabolic encephalopathy: Secondary | ICD-10-CM

## 2022-01-07 DIAGNOSIS — T68XXXA Hypothermia, initial encounter: Secondary | ICD-10-CM | POA: Diagnosis present

## 2022-01-07 DIAGNOSIS — E8809 Other disorders of plasma-protein metabolism, not elsewhere classified: Secondary | ICD-10-CM

## 2022-01-07 DIAGNOSIS — K219 Gastro-esophageal reflux disease without esophagitis: Secondary | ICD-10-CM

## 2022-01-07 DIAGNOSIS — E46 Unspecified protein-calorie malnutrition: Secondary | ICD-10-CM

## 2022-01-07 DIAGNOSIS — Z91148 Patient's other noncompliance with medication regimen for other reason: Secondary | ICD-10-CM

## 2022-01-07 DIAGNOSIS — J439 Emphysema, unspecified: Secondary | ICD-10-CM

## 2022-01-07 DIAGNOSIS — I959 Hypotension, unspecified: Secondary | ICD-10-CM

## 2022-01-07 LAB — COMPREHENSIVE METABOLIC PANEL
ALT: 7 U/L (ref 0–44)
AST: 11 U/L — ABNORMAL LOW (ref 15–41)
Albumin: 2.8 g/dL — ABNORMAL LOW (ref 3.5–5.0)
Alkaline Phosphatase: 26 U/L — ABNORMAL LOW (ref 38–126)
Anion gap: 9 (ref 5–15)
BUN: 13 mg/dL (ref 8–23)
CO2: 20 mmol/L — ABNORMAL LOW (ref 22–32)
Calcium: 7.8 mg/dL — ABNORMAL LOW (ref 8.9–10.3)
Chloride: 111 mmol/L (ref 98–111)
Creatinine, Ser: 1.19 mg/dL (ref 0.61–1.24)
GFR, Estimated: >60 mL/min (ref >60)
Glucose, Bld: 96 mg/dL (ref 70–99)
Potassium: 4 mmol/L (ref 3.5–5.1)
Sodium: 140 mmol/L (ref 135–145)
Total Bilirubin: 0.2 mg/dL — ABNORMAL LOW (ref 0.3–1.2)
Total Protein: 5.3 g/dL — ABNORMAL LOW (ref 6.5–8.1)

## 2022-01-07 LAB — CBC
HCT: 30 % — ABNORMAL LOW (ref 39.0–52.0)
Hemoglobin: 9.4 g/dL — ABNORMAL LOW (ref 13.0–17.0)
MCH: 28.8 pg (ref 26.0–34.0)
MCHC: 31.3 g/dL (ref 30.0–36.0)
MCV: 92 fL (ref 80.0–100.0)
Platelets: 225 10*3/uL (ref 150–400)
RBC: 3.26 MIL/uL — ABNORMAL LOW (ref 4.22–5.81)
RDW: 16.2 % — ABNORMAL HIGH (ref 11.5–15.5)
WBC: 5.6 10*3/uL (ref 4.0–10.5)
nRBC: 0 % (ref 0.0–0.2)

## 2022-01-07 LAB — MAGNESIUM: Magnesium: 1.5 mg/dL — ABNORMAL LOW (ref 1.7–2.4)

## 2022-01-07 LAB — PHOSPHORUS: Phosphorus: 3.3 mg/dL (ref 2.5–4.6)

## 2022-01-07 LAB — CK: Total CK: 110 U/L (ref 49–397)

## 2022-01-07 LAB — MRSA NEXT GEN BY PCR, NASAL: MRSA by PCR Next Gen: NOT DETECTED

## 2022-01-07 LAB — APTT: aPTT: 27 seconds (ref 24–36)

## 2022-01-07 MED ORDER — FOLIC ACID 1 MG PO TABS: 1.0000 mg | ORAL_TABLET | Freq: Every day | ORAL | Status: DC

## 2022-01-07 MED ORDER — LORAZEPAM 2 MG/ML IJ SOLN: 1.0000 mg | INTRAMUSCULAR | Status: DC | PRN

## 2022-01-07 MED ORDER — CHLORHEXIDINE GLUCONATE CLOTH 2 % EX PADS
6.0000 | MEDICATED_PAD | Freq: Every day | CUTANEOUS | Status: DC
Start: 2022-01-07 — End: 2022-01-08
  Administered 2022-01-07 – 2022-01-08 (×3): 6 via TOPICAL

## 2022-01-07 MED ORDER — HYDRALAZINE HCL 20 MG/ML IJ SOLN
10.0000 mg | Freq: Four times a day (QID) | INTRAMUSCULAR | Status: DC | PRN
  Administered 2022-01-08: 10 mg via INTRAVENOUS
  Filled 2022-01-07: qty 1

## 2022-01-07 MED ORDER — SODIUM CHLORIDE 0.9 % IV SOLN: INTRAVENOUS | Status: AC

## 2022-01-07 MED ORDER — ADULT MULTIVITAMIN W/MINERALS CH
1.0000 | ORAL_TABLET | Freq: Every day | ORAL | Status: DC
  Administered 2022-01-07 – 2022-01-08 (×2): 1 via ORAL
  Filled 2022-01-07 (×2): qty 1

## 2022-01-07 MED ORDER — THIAMINE HCL 100 MG/ML IJ SOLN: 100.0000 mg | Freq: Every day | INTRAMUSCULAR | Status: DC

## 2022-01-07 MED ORDER — FOLIC ACID 1 MG PO TABS
1.0000 mg | ORAL_TABLET | Freq: Every day | ORAL | Status: DC
  Administered 2022-01-07 – 2022-01-08 (×2): 1 mg via ORAL
  Filled 2022-01-07 (×2): qty 1

## 2022-01-07 MED ORDER — ENSURE ENLIVE PO LIQD
237.0000 mL | Freq: Two times a day (BID) | ORAL | Status: DC
  Administered 2022-01-08: 237 mL via ORAL

## 2022-01-07 MED ORDER — ADULT MULTIVITAMIN W/MINERALS CH: 1.0000 | ORAL_TABLET | Freq: Every day | ORAL | Status: DC

## 2022-01-07 MED ORDER — LEVETIRACETAM 500 MG PO TABS
1000.0000 mg | ORAL_TABLET | Freq: Two times a day (BID) | ORAL | Status: DC
  Administered 2022-01-07 – 2022-01-08 (×3): 1000 mg via ORAL
  Filled 2022-01-07 (×3): qty 2

## 2022-01-07 MED ORDER — POTASSIUM CHLORIDE CRYS ER 20 MEQ PO TBCR
40.0000 meq | EXTENDED_RELEASE_TABLET | Freq: Once | ORAL | Status: AC
  Administered 2022-01-07: 40 meq via ORAL
  Filled 2022-01-07: qty 2

## 2022-01-07 MED ORDER — POTASSIUM CHLORIDE 10 MEQ/100ML IV SOLN
10.0000 meq | INTRAVENOUS | Status: AC
  Administered 2022-01-07 (×2): 10 meq via INTRAVENOUS
  Filled 2022-01-07 (×2): qty 100

## 2022-01-07 MED ORDER — SODIUM CHLORIDE 0.9 % IV SOLN: INTRAVENOUS | Status: DC

## 2022-01-07 MED ORDER — SODIUM CHLORIDE 0.9 % IV BOLUS
500.0000 mL | Freq: Once | INTRAVENOUS | Status: AC
  Administered 2022-01-07: 500 mL via INTRAVENOUS

## 2022-01-07 MED ORDER — THIAMINE HCL 100 MG PO TABS
100.0000 mg | ORAL_TABLET | Freq: Every day | ORAL | Status: DC
  Administered 2022-01-07: 100 mg via ORAL
  Filled 2022-01-07: qty 1

## 2022-01-07 MED ORDER — ENOXAPARIN SODIUM 40 MG/0.4ML IJ SOSY
40.0000 mg | PREFILLED_SYRINGE | INTRAMUSCULAR | Status: DC
  Administered 2022-01-07 – 2022-01-08 (×2): 40 mg via SUBCUTANEOUS
  Filled 2022-01-07 (×2): qty 0.4

## 2022-01-07 MED ORDER — MAGNESIUM SULFATE 2 GM/50ML IV SOLN
2.0000 g | Freq: Once | INTRAVENOUS | Status: AC
  Administered 2022-01-07: 2 g via INTRAVENOUS
  Filled 2022-01-07: qty 50

## 2022-01-07 MED ORDER — LORAZEPAM 1 MG PO TABS
1.0000 mg | ORAL_TABLET | ORAL | Status: DC | PRN
  Administered 2022-01-07 (×2): 1 mg via ORAL
  Filled 2022-01-07 (×2): qty 1

## 2022-01-07 MED ORDER — PANTOPRAZOLE SODIUM 40 MG PO TBEC
80.0000 mg | DELAYED_RELEASE_TABLET | Freq: Every day | ORAL | Status: DC
  Administered 2022-01-07 – 2022-01-08 (×2): 80 mg via ORAL
  Filled 2022-01-07 (×2): qty 2

## 2022-01-07 MED ORDER — THIAMINE HCL 100 MG PO TABS
100.0000 mg | ORAL_TABLET | Freq: Every day | ORAL | Status: DC
  Administered 2022-01-08: 100 mg via ORAL
  Filled 2022-01-07: qty 1

## 2022-01-07 MED ORDER — FLUTICASONE FUROATE-VILANTEROL 200-25 MCG/INH IN AEPB
1.0000 | INHALATION_SPRAY | Freq: Every day | RESPIRATORY_TRACT | Status: DC
Start: 2022-01-07 — End: 2022-01-08
  Administered 2022-01-07 – 2022-01-08 (×2): 1 via RESPIRATORY_TRACT
  Filled 2022-01-07: qty 1
  Filled 2022-01-07: qty 28

## 2022-01-07 MED ORDER — ALBUTEROL SULFATE (2.5 MG/3ML) 0.083% IN NEBU: 3.0000 mL | INHALATION_SOLUTION | RESPIRATORY_TRACT | Status: DC | PRN

## 2022-01-07 NOTE — Progress Notes (Signed)
Patient seen and evaluated, chart reviewed, please see EMR for updated orders. Please see full H&P dictated by admitting physician Dr. Josephine Cables for same date of service.    Brief Summary:- 71 y.o. male with medical history significant of alcohol abuse, COPD, hypertension, GERD, history of seizures, alcoholic dementia and failure to thrive in adult    A/p 1)DTs/alcohol abuse----patient having full-blown DT symptoms -On admission blood alcohol level was 219 continue lorazepam per [facility]WA protocol, -Continue thiamine and folic acid  2)Hypothermia/Hypotension--- required IV fluids and Bear hugger/warming blanket -Temperature BP improving  3)AKI----acute kidney injury  - creatinine on admission=1.56  , - baseline creatinine = 0.9   ,  - renally adjust medications, avoid nephrotoxic agents / dehydration  / hypotension  4)Hypokalemia/hypomagnesemia--- suspect due to beer potomania as well as HCTZ use  -replace and recheck  5)HTN--continue to hold lisinopril HCTZ as well as hydralazine and isosorbide due to hypotension  6)COPD/Tobacco Abuse --- smoking cessation advised -Continue bronchodilators  7)GERD---c/n Protonix  8)Seizure DO--continue Keppra  Patient seen and evaluated, chart reviewed, please see EMR for updated orders. Please see full H&P dictated by admitting physician Dr. Josephine Cables for same date of service.   -- Total care time over 46 minutes  Roxan Hockey, MD

## 2022-01-07 NOTE — H&P (Signed)
History and Physical    Patient: Scott Jackson GYI:[id number] DOB: 03/07/1951 DOA: 01/06/2022 DOS: the patient was seen and examined on 01/07/2022 PCP: Caren Macadam, MD  Patient coming from: Home  Chief Complaint:  Chief Complaint  Patient presents with   Altered Mental Status   HPI: Camaron Jackson is a 71 y.o. male with medical history significant of alcohol abuse, COPD, hypertension, GERD, history of seizures, alcoholic dementia and failure to thrive in adult who presents emergency department via EMS with an episode of unresponsiveness.  At bedside, patient was awake, alert and oriented x3, he states that he did not remember what happened and that he only noted he was in the ED when he woke up.  History was obtained from ED physician and ED medical record.  Per report, patient was found laying in the yard by neighbor, EMS was activated on arrival of EMS team, patient was unresponsive to pain and verbal stimuli, CBG was reported to be greater than 100, but initial BP was 60/40.  1 L of LR was given without much improvement in BP and he was taken to the ED.  Patient was noted to be awake and communicative by the time he arrived to the ED. Patient was admitted from 3/21 to 3/24 due to acute metabolic encephalopathy during which he had an episode of unresponsiveness, twitching of right face injection of left arm and seizure activity in setting of alcohol abuse.  He had postictal state related to alcohol withdrawal, electrolyte abnormalities were corrected, seizure was treated.  ED Course:  In the emergency department, temperature was 94.5F, he was intermittently tachypneic, BP was 67/48, pulse 72 bpm and O2 sat was 99% on room air.  Work-up in the ED showed normocytic anemia, hypokalemia, BUN/creatinine 40/1.56 (baseline creatinine at 0.9), D-dimer 0.50, alcohol level 219, troponin x2 was negative, ammonia level 22, urine drug screen was negative, urinalysis was normal, total CK1 110, albumin  3.3. CT head without contrast showed no acute intracranial pathology Chest x-ray showed lower lung volumes from prior exam with no acute findings He was treated with loading dose of Keppra, IV hydration was provided, Bair hugger was provided due to hypothermia.  Hospitalist was asked to admit patient for further evaluation and management.  Review of Systems: Review of systems as noted in the HPI. All other systems reviewed and are negative.   Past Medical History:  Diagnosis Date   ETOH abuse    Gastritis and duodenitis    Hypertension    Pulmonary nodule    Tobacco abuse    Past Surgical History:  Procedure Laterality Date   ANKLE FRACTURE SURGERY     APPENDECTOMY     BIOPSY  01/08/2019   Procedure: BIOPSY;  Surgeon: Danie Binder, MD;  [facility];  Service: Endoscopy;;  gastric   BIOPSY  11/22/2021   Procedure: BIOPSY;  Surgeon: Eloise Harman, DO;  [facility];  Service: Endoscopy;;   COLONOSCOPY  01/2015   Dr. Posey Pronto: two 2-5m rectal polyps removed, hyperplastic   COLONOSCOPY  2007   Dr. PPosey Pronto three adenomas removed measuring 4, 10, 166m diverticulosis   COLONOSCOPY WITH PROPOFOL N/A 01/08/2019   Dr. FiOneida AlarSevere diverticulosis with fixed rectosigmoid region, rectal bleeding likely due to internal hemorrhoids.  [facility] due to fixed rectosigmoid colon (patient never followed through).   COLONOSCOPY WITH PROPOFOL N/A 11/22/2021   Procedure: COLONOSCOPY WITH PROPOFOL;  Surgeon: CaAbbey Chatters  Elon Alas, DO;  [facility];  Service: Endoscopy;  Laterality: N/A;  2:30pm   ESOPHAGOGASTRODUODENOSCOPY (EGD) WITH PROPOFOL N/A 01/08/2019   Dr. Oneida Alar: Low-grade narrowing Schatzki ring at the GE junction, medium sized hiatal hernia, erosive gastritis/duodenitis in the setting of NSAID and alcohol use.  Gastric biopsy showed gastropathy but no H. pylori   ESOPHAGOGASTRODUODENOSCOPY (EGD)  WITH PROPOFOL N/A 11/22/2021   Procedure: ESOPHAGOGASTRODUODENOSCOPY (EGD) WITH PROPOFOL;  Surgeon: Eloise Harman, DO;  [facility];  Service: Endoscopy;  Laterality: N/A;   intestininal blockage  1970   POLYPECTOMY  11/22/2021   Procedure: POLYPECTOMY;  Surgeon: Eloise Harman, DO;  [facility];  Service: Endoscopy;;    Social History:  reports that he has been smoking cigarettes. He has a 12.50 pack-year smoking history. He has never used smokeless tobacco. He reports current alcohol use. He reports that he does not use drugs.   No Known Allergies  Family History  Problem Relation Age of Onset   Hypertension Mother    Stroke Mother    Fibromyalgia Daughter    Lupus Daughter    Anxiety disorder Daughter    Post-traumatic stress disorder Daughter    Asthma Daughter    Breast cancer Sister    Breast cancer Sister    Colon cancer Neg Hx      Prior to Admission medications   Medication Sig Start Date End Date Taking? Authorizing Provider  acetaminophen (TYLENOL) 325 MG tablet Take 2 tablets (650 mg total) by mouth every 6 (six) hours as needed for mild pain (or Fever >/= 101). 01/10/21   Roxan Hockey, MD  albuterol (VENTOLIN HFA) 108 (90 Base) MCG/ACT inhaler Inhale 2 puffs into the lungs every 4 (four) hours as needed for wheezing or shortness of breath. 01/10/21   Roxan Hockey, MD  amLODipine (NORVASC) 10 MG tablet Take 1 tablet (10 mg total) by mouth daily. 01/10/21   Roxan Hockey, MD  chlordiazePOXIDE (LIBRIUM) 25 MG capsule Take 1 capsule (25 mg total) by mouth 4 (four) times daily as needed for anxiety or withdrawal. Patient not taking: Reported on 11/17/2021 10/15/21   Manuella Ghazi, Pratik D, DO  fluticasone furoate-vilanterol (BREO ELLIPTA) 200-25 MCG/INH AEPB Inhale 1 puff into the lungs daily. 01/11/21   Roxan Hockey, MD  folic acid (FOLVITE) 1 MG tablet Take 1 tablet (1 mg total) by mouth daily. 01/11/21   Roxan Hockey, MD  gabapentin  (NEURONTIN) 300 MG capsule 1 capsule Patient not taking: Reported on 10/14/2021    [provider]  levETIRAcetam (KEPPRA) 500 MG tablet Take 2 tablets (1,000 mg total) by mouth 2 (two) times daily. 09/28/21   Dohmeier, Asencion Partridge, MD  Multiple Vitamin (MULTIVITAMIN WITH MINERALS) TABS tablet Take 1 tablet by mouth daily. 01/11/21   Roxan Hockey, MD  omeprazole (PRILOSEC) 40 MG capsule Take 1 capsule (40 mg total) by mouth daily. 11/22/21 11/22/22  Eloise Harman, DO  tamsulosin (FLOMAX) 0.4 MG CAPS capsule Take 1 capsule (0.4 mg total) by mouth daily. 01/10/21   Roxan Hockey, MD  thiamine 100 MG tablet Take 1 tablet (100 mg total) by mouth daily. 01/11/21   Roxan Hockey, MD    Physical Exam: BP (!) 72/46   Pulse 74   Temp 97.6 F (36.4 C) (Oral)   Resp 10   Ht '6\' 2"'$  (1.88 m)   Wt 67.5 kg   SpO2 93%   BMI 19.11 kg/m   General: 71 y.o. year-old male cachetic, ill  appearing, but in no acute distress.  Alert and oriented x3. HEENT: NCAT, EOMI, dry mucous membrane Neck: Supple, trachea medial Cardiovascular: Regular rate and rhythm with no rubs or gallops.  No thyromegaly or JVD noted.  No lower extremity edema. 2/4 pulses in all 4 extremities. Respiratory: Clear to auscultation with no wheezes or rales. Good inspiratory effort. Abdomen: Soft, nontender nondistended with normal bowel sounds x4 quadrants. Muskuloskeletal: No cyanosis, clubbing or edema noted bilaterally Neuro: CN II-XII intact, sensation, reflexes intact Skin: No ulcerative lesions noted or rashes Psychiatry: Judgement and insight appear normal. Mood is appropriate for condition and setting          Labs on Admission:  Basic Metabolic Panel: Recent Labs  Lab 01/06/22 2111  NA 138  K 3.2*  CL 106  CO2 20*  GLUCOSE 129*  BUN 14  CREATININE 1.56*  CALCIUM 8.5*   Liver Function Tests: Recent Labs  Lab 01/06/22 2310  AST 14*  ALT 7  ALKPHOS 30*  BILITOT 0.2*  PROT 6.0*  ALBUMIN 3.3*   No  results for input(s): "LIPASE", "AMYLASE" in the last 168 hours. Recent Labs  Lab 01/06/22 2310  AMMONIA 22   CBC: Recent Labs  Lab 01/06/22 2111  WBC 6.7  NEUTROABS 4.9  HGB 9.9*  HCT 31.6*  MCV 94.0  PLT 220   Cardiac Enzymes: Recent Labs  Lab 01/06/22 2310  CKTOTAL 110    BNP (last 3 results) No results for input(s): "BNP" in the last 8760 hours.  ProBNP (last 3 results) No results for input(s): "PROBNP" in the last 8760 hours.  CBG: Recent Labs  Lab 01/06/22 2059  GLUCAP 131*    Radiological Exams on Admission: CT Head Wo Contrast  Result Date: 01/06/2022 CLINICAL DATA:  Altered mental status. EXAM: CT HEAD WITHOUT CONTRAST TECHNIQUE: Contiguous axial images were obtained from the base of the skull through the vertex without intravenous contrast. RADIATION DOSE REDUCTION: This exam was performed according to the departmental dose-optimization program which includes automated exposure control, adjustment of the mA and/or kV according to patient size and/or use of iterative reconstruction technique. COMPARISON:  Head CT dated 10/12/2021. FINDINGS: Brain: Mild age-related atrophy and chronic microvascular ischemic changes. There is no acute intracranial hemorrhage. No mass effect midline shift. No extra-axial fluid collection. Vascular: No hyperdense vessel or unexpected calcification. Skull: Normal. Negative for fracture or focal lesion. Sinuses/Orbits: There is diffuse mucoperiosteal thickening of paranasal sinuses. No air-fluid level. The mastoid air cells are clear. Other: None IMPRESSION: 1. No acute intracranial pathology. 2. Mild age-related atrophy and chronic microvascular ischemic changes. Electronically Signed   By: Anner Crete M.D.   On: 01/06/2022 21:41   DG Chest Portable 1 View  Result Date: 01/06/2022 CLINICAL DATA:  Syncope.  Altered mental status. EXAM: PORTABLE CHEST 1 VIEW COMPARISON:  Radiograph 10/12/2021, chest CT 09/13/2021 FINDINGS: Lower  lung volumes from prior exam. The heart is normal in size for technique. No acute airspace disease. No pleural effusion or pneumothorax. No pulmonary edema. Pulmonary nodule on prior CT is not well demonstrated by radiograph. Callus formation about prior rib fractures of posterior right ribs, unchanged. IMPRESSION: Lower lung volumes from prior exam. No acute findings. Electronically Signed   By: Keith Rake M.D.   On: 01/06/2022 21:27    EKG: I independently viewed the EKG done and my findings are as followed: Normal sinus rhythm at rate of 68 bpm  Assessment/Plan Present on Admission:  Hypothermia  AKI (acute kidney injury) (  [place])  Alcohol abuse  Acute metabolic encephalopathy  COPD/Emphysema of lung (H[facility])  Principal Problem:   Hypothermia Active Problems:   Essential hypertension   Alcohol abuse   COPD/Emphysema of lung ([facility])   Seizure disorder (HCC)   AKI (acute kidney injury) ([place])   Acute metabolic encephalopathy   Hypotension   Hypokalemia   Dehydration   Hypoalbuminemia due to protein-calorie malnutrition ([facility])   Noncompliance with medication regimen   GERD (gastroesophageal reflux disease)  Hypothermia Patient was noted to be hypothermic with a temperature of 94.107F > 93.20F Bair Hugger was provided with improved temperature to 97.54F He was in no acute distress  Hypotension This may be due to dehydration/hypovolemia IV hydration was provided with goal to maintain MAP of 65 and above  Hypokalemia K+ 3.2, this will be replenished  Acute kidney injury/dehydration BUN/creatinine 40/1.56 (baseline creatinine at 0.9) Continue IV hydration Renally adjust medications, avoid nephrotoxic agents/dehydration/hypotension  Acute metabolic encephalopathy-resolved Patient had an episode of unresponsiveness.  It was unknown if patient had seizures and was in postictal state when made by EMS team. Patient appears to have returned to baseline of functioning Continue to  monitor  Seizure disorder Loading dose of Keppra was given in the ED Continue home Keppra  Hypoalbuminemia possibly secondary to mild protein calorie malnutrition Albumin 3.3, protein supplement to be provided  Alcohol abuse At baseline drinks half a pint of vodka daily per medical record Alcohol level was 219; patient has no withdrawal symptoms at this time Continue to monitor for withdrawal symptoms and consider starting patient on CIWA protocol Continue thiamine, folic acid and multivitamin  Essential hypertension BP meds will be held at this time due to soft BP  COPD Continue Ventolin and Breo Ellipta  GERD Continue Protonix  Tobacco abuse Patient was counseled on tobacco abuse cessation  Noncompliant with medication regimen Patient was counseled on compliance with medication regimen  DVT prophylaxis: Lovenox  Code Status: Full dose  Consults: None  Family Communication: None at bedside  Severity of Illness: The appropriate patient status for this patient is OBSERVATION. Observation status is judged to be reasonable and necessary in order to provide the required intensity of service to ensure the patient's safety. The patient's presenting symptoms, physical exam findings, and initial radiographic and laboratory data in the context of their medical condition is felt to place them at decreased risk for further clinical deterioration. Furthermore, it is anticipated that the patient will be medically stable for discharge from the hospital within 2 midnights of admission.   Author: Bernadette Hoit, DO 01/07/2022 2:31 AM  For on call review [email or web address].

## 2022-01-07 NOTE — TOC Initial Note (Signed)
Transition of Care [facility]) - Initial/Assessment Note    Patient Details  Name: Scott Jackson MRN: [id number] Date of Birth: [birth year]  [facility]) CM/SW Contact:    Boneta Lucks, RN Phone Number: 01/07/2022, 2:06 PM  Clinical Narrative:         Patient admitted with Hypothermia. In Dt's a present,  Patient recently at [facility] and had [facility] in the past. Vaughan Basta here and will follow for home health needs at discharge. MD updated.       Expected Discharge Plan: [place] Barriers to Discharge: Continued Medical Work up   Patient Goals and CMS Choice Patient states their goals for this hospitalization and ongoing recovery are:: to go home. CMS Medicare.gov Compare Post Acute Care list provided to:: Patient Choice offered to / list presented to : Patient  Expected Discharge Plan and Services Expected Discharge Plan: [place]      Prior Living Arrangements/Services     Activities of Daily Living Home Assistive Devices/Equipment: None ADL Screening (condition at time of admission) Patient's cognitive ability adequate to safely complete daily activities?: Yes Is the patient deaf or have difficulty hearing?: No Does the patient have difficulty seeing, even when wearing glasses/contacts?: No Does the patient have difficulty concentrating, remembering, or making decisions?: No Patient able to express need for assistance with ADLs?: Yes Does the patient have difficulty dressing or bathing?: No Independently performs ADLs?: Yes (appropriate for developmental age) Does the patient have difficulty walking or climbing stairs?: Yes Weakness of Legs: Both Weakness of Arms/Hands: Both  Permission Sought/Granted  Emotional Assessment   Admission diagnosis:  Hypothermia [T68.XXXA] Unresponsiveness [R41.89] Patient Active Problem List   Diagnosis Date Noted   Hypothermia 01/07/2022   Hypotension 01/07/2022   Hypokalemia  01/07/2022   Dehydration 01/07/2022   Hypoalbuminemia due to protein-calorie malnutrition ([place]) 01/07/2022   Noncompliance with medication regimen 01/07/2022   GERD (gastroesophageal reflux disease) 01/07/2022   AKI (acute kidney injury) ([place]) 89/38/1017   Acute metabolic encephalopathy 51/08/5850   Electrolyte abnormality 10/12/2021   Prolonged QT interval 10/12/2021   Encephalopathy chronic 09/28/2021   Nausea and vomiting 05/20/2021   Increased ammonia level 05/20/2021   History of gastritis 05/20/2021   Syncope 01/09/2021   Tobacco abuse 01/09/2021   Alcohol dependence with other alcohol-induced disorder ([place]) 05/07/2020   Seizure disorder ([place]) 05/07/2020   Anorexia nervosa 10/16/2019   Loss of weight 05/13/2019   Abnormal CT scan, colon 05/13/2019   Single seizure ([place]) 04/09/2019   Adult failure to thrive 04/09/2019   Alcoholism with alcohol dependence ([place]) 03/19/2019   Alcoholism associated with dementia ([place]) 03/19/2019   COPD/Emphysema of lung ([place]) 03/19/2019   Abdominal pain, epigastric    Hemorrhoid 09/28/2018   Rectal bleeding 09/28/2018   Constipation 09/28/2018   Screen for sexually transmitted diseases 11/24/2017   Hyperlipidemia 11/24/2017   Essential hypertension 11/24/2017   Screening for viral disease 11/24/2017   Alcohol abuse 11/24/2017   Alcohol abuse counseling and surveillance of alcoholic 77/82/4235   PCP:  Caren Macadam, MD Pharmacy:   [facility] - [place], [place] - [address] [place] [address] Phone: [phone] Fax: [phone]   Readmission Risk Interventions     No data to display

## 2022-01-08 DIAGNOSIS — N179 Acute kidney failure, unspecified: Secondary | ICD-10-CM | POA: Diagnosis not present

## 2022-01-08 DIAGNOSIS — G9341 Metabolic encephalopathy: Secondary | ICD-10-CM | POA: Diagnosis not present

## 2022-01-08 DIAGNOSIS — T68XXXA Hypothermia, initial encounter: Secondary | ICD-10-CM | POA: Diagnosis not present

## 2022-01-08 DIAGNOSIS — F1010 Alcohol abuse, uncomplicated: Secondary | ICD-10-CM | POA: Diagnosis not present

## 2022-01-08 LAB — RENAL FUNCTION PANEL
Albumin: 3.3 g/dL — ABNORMAL LOW (ref 3.5–5.0)
Anion gap: 7 (ref 5–15)
BUN: 13 mg/dL (ref 8–23)
CO2: 24 mmol/L (ref 22–32)
Calcium: 8.9 mg/dL (ref 8.9–10.3)
Chloride: 106 mmol/L (ref 98–111)
Creatinine, Ser: 1.03 mg/dL (ref 0.61–1.24)
GFR, Estimated: >60 mL/min (ref >60)
Glucose, Bld: 96 mg/dL (ref 70–99)
Phosphorus: 2.7 mg/dL (ref 2.5–4.6)
Potassium: 4.2 mmol/L (ref 3.5–5.1)
Sodium: 137 mmol/L (ref 135–145)

## 2022-01-08 MED ORDER — CARVEDILOL 6.25 MG PO TABS: 6.2500 mg | ORAL_TABLET | Freq: Two times a day (BID) | ORAL | 5 refills | Status: DC

## 2022-01-08 MED ORDER — ADULT MULTIVITAMIN W/MINERALS CH: 1.0000 | ORAL_TABLET | Freq: Every day | ORAL | 3 refills | Status: DC

## 2022-01-08 MED ORDER — CHLORDIAZEPOXIDE HCL 10 MG PO CAPS: 10.0000 mg | ORAL_CAPSULE | ORAL | 0 refills | Status: DC

## 2022-01-08 MED ORDER — ISOSORBIDE MONONITRATE ER 30 MG PO TB24: 30.0000 mg | ORAL_TABLET | Freq: Every day | ORAL | 3 refills | Status: DC

## 2022-01-08 MED ORDER — AMLODIPINE BESYLATE 5 MG PO TABS
10.0000 mg | ORAL_TABLET | Freq: Every day | ORAL | Status: DC
  Administered 2022-01-08: 10 mg via ORAL
  Filled 2022-01-08: qty 2

## 2022-01-08 MED ORDER — ALBUTEROL SULFATE HFA 108 (90 BASE) MCG/ACT IN AERS: 2.0000 | INHALATION_SPRAY | RESPIRATORY_TRACT | 4 refills | Status: DC | PRN

## 2022-01-08 MED ORDER — AMLODIPINE BESYLATE 10 MG PO TABS: 10.0000 mg | ORAL_TABLET | Freq: Every day | ORAL | 3 refills | Status: DC

## 2022-01-08 MED ORDER — PANTOPRAZOLE SODIUM 40 MG PO TBEC: 40.0000 mg | DELAYED_RELEASE_TABLET | Freq: Every day | ORAL | 3 refills | Status: DC

## 2022-01-08 MED ORDER — UMECLIDINIUM-VILANTEROL 62.5-25 MCG/ACT IN AEPB: 1.0000 | INHALATION_SPRAY | Freq: Every day | RESPIRATORY_TRACT | 2 refills | Status: DC

## 2022-01-08 MED ORDER — FOLIC ACID 1 MG PO TABS: 1.0000 mg | ORAL_TABLET | Freq: Every day | ORAL | 5 refills | Status: DC

## 2022-01-08 MED ORDER — THIAMINE HCL 100 MG PO TABS: 100.0000 mg | ORAL_TABLET | Freq: Every day | ORAL | 3 refills | Status: DC

## 2022-01-08 MED ORDER — HYDRALAZINE HCL 50 MG PO TABS: 50.0000 mg | ORAL_TABLET | Freq: Three times a day (TID) | ORAL | 3 refills | Status: DC

## 2022-01-08 MED ORDER — LABETALOL HCL 5 MG/ML IV SOLN
5.0000 mg | Freq: Four times a day (QID) | INTRAVENOUS | Status: DC | PRN
  Administered 2022-01-08: 5 mg via INTRAVENOUS
  Filled 2022-01-08: qty 4

## 2022-01-08 MED ORDER — TAMSULOSIN HCL 0.4 MG PO CAPS: 0.4000 mg | ORAL_CAPSULE | Freq: Every day | ORAL | 3 refills | Status: DC

## 2022-01-08 MED ORDER — LEVETIRACETAM 1000 MG PO TABS: 1000.0000 mg | ORAL_TABLET | Freq: Two times a day (BID) | ORAL | 3 refills | Status: DC

## 2022-01-08 NOTE — Discharge Summary (Incomplete)
Scott Jackson, is a 71 y.o. male  DOB Dec 18, 1950  MRN [id number].  Admission date:  01/06/2022  Admitting Physician  Bernadette Hoit, DO  Discharge Date:  01/08/2022   Primary MD  Caren Macadam, MD  Recommendations for primary care physician for things to follow:   1)Abstinence from Tobacco and Alcohol advised 2)Stop Lisinopril/HCTZ and take Isosorbide and Hydralazine instead for For blood pressure and Heart 3)Please take you Keppra/levetiracetam--antiseizure medication to reduce your risk for seizures  Admission Diagnosis  Hypothermia [T68.XXXA] Unresponsiveness [R41.89]   Discharge Diagnosis  Hypothermia [T68.XXXA] Unresponsiveness [R41.89]  ***  Principal Problem:   Hypothermia Active Problems:   Essential hypertension   Alcohol abuse   COPD/Emphysema of lung (HCC)   Seizure disorder (HCC)   AKI (acute kidney injury) ([place])   Acute metabolic encephalopathy   Hypotension   Hypokalemia   Dehydration   Hypoalbuminemia due to protein-calorie malnutrition ([place])   Noncompliance with medication regimen   GERD (gastroesophageal reflux disease)      Past Medical History:  Diagnosis Date   ETOH abuse    Gastritis and duodenitis    Hypertension    Pulmonary nodule    Tobacco abuse     Past Surgical History:  Procedure Laterality Date   ANKLE FRACTURE SURGERY     APPENDECTOMY     BIOPSY  01/08/2019   Procedure: BIOPSY;  Surgeon: Danie Binder, MD;  [facility];  Service: Endoscopy;;  gastric   BIOPSY  11/22/2021   Procedure: BIOPSY;  Surgeon: Eloise Harman, DO;  [facility];  Service: Endoscopy;;   COLONOSCOPY  01/2015   Dr. Posey Pronto: two 2-19m rectal polyps removed, hyperplastic   COLONOSCOPY  2007   Dr. PPosey Pronto three adenomas removed measuring 4, 10, 148m diverticulosis   COLONOSCOPY WITH PROPOFOL N/A 01/08/2019   Dr. FiOneida AlarSevere diverticulosis with fixed  rectosigmoid region, rectal bleeding likely due to internal hemorrhoids.  [facility] to [facility]igm[facility] colon (patient never followed through).   COLONOSCOPY WITH PROPOFOL N/A 11/22/2021   Procedure: COLONOSCOPY WITH PROPOFOL;  Surgeon: CaEloise HarmanDO;  [facility];  Service: Endoscopy;  Laterality: N/A;  2:30pm   ESOPHAGOGASTRODUODENOSCOPY (EGD) WITH PROPOFOL N/A 01/08/2019   Dr. FiOneida AlarLow-grade narrowing Schatzki ring at the GE junction, medium sized hiatal hernia, erosive gastritis/duodenitis in the setting of NSAID and alcohol use.  Gastric biopsy showed gastropathy but no H. pylori   ESOPHAGOGASTRODUODENOSCOPY (EGD) WITH PROPOFOL N/A 11/22/2021   Procedure: ESOPHAGOGASTRODUODENOSCOPY (EGD) WITH PROPOFOL;  Surgeon: CaEloise HarmanDO;  [facility];  Service: Endoscopy;  Laterality: N/A;   intestininal blockage  1970   POLYPECTOMY  11/22/2021   Procedure: POLYPECTOMY;  Surgeon: CaEloise HarmanDO;  [facility];  Service: Endoscopy;;       HPI  from the history and physical done on the day of admission:     ***  ****  Hospital Course:     No notes on file  ***** Assessment and Plan: No notes have been filed under this hospital service. Service: Hospitalist       Discharge Condition: ***  Follow UP     Consults obtained - ***  Diet and Activity recommendation:  As advised  Discharge Instructions    **** Discharge Instructions     Diet - low sodium heart healthy   Complete by: As directed    Discharge instructions   Complete by: As directed    1)Abstinence from Tobacco and Alcohol advised 2)Stop Lisinopril/HCTZ and take Isosorbide and Hydralazine instead for For blood pressure and Heart 3)Please take you Keppra/levetiracetam--antiseizure medication to reduce your risk for seizures   Increase activity slowly   Complete by: As directed           Discharge Medications     Allergies as of 01/08/2022   No Known Allergies      Medication List     STOP taking these medications    fluticasone furoate-vilanterol 200-25 MCG/INH Aepb Commonly known as: BREO ELLIPTA   gabapentin 300 MG capsule Commonly known as: NEURONTIN   lisinopril-hydrochlorothiazide 20-12.5 MG tablet Commonly known as: ZESTORETIC   omeprazole 40 MG capsule Commonly known as: PRILOSEC   PARoxetine 40 MG tablet Commonly known as: PAXIL       TAKE these medications    acetaminophen 325 MG tablet Commonly known as: TYLENOL Take 2 tablets (650 mg total) by mouth every 6 (six) hours as needed for mild pain (or Fever >/= 101).   albuterol 108 (90 Base) MCG/ACT inhaler Commonly known as: VENTOLIN HFA Inhale 2 puffs into the lungs every 4 (four) hours as needed for wheezing or shortness of breath.   amLODipine 10 MG tablet Commonly known as: NORVASC Take 1 tablet (10 mg total) by mouth daily. For BP What changed: additional instructions   carvedilol 6.25 MG tablet Commonly known as: Coreg Take 1 tablet (6.25 mg total) by mouth 2 (two) times daily with a meal. For BP and Heart   chlordiazePOXIDE 10 MG capsule Commonly known as: LIBRIUM Take 1 capsule (10 mg total) by mouth See admin instructions. Take 1 capsule  3 times a day for 2 days, then 1 capsule 2 times a day for 2 days, then 1 capsule 2 times a day for 2 days and stop What changed:  medication strength how much to take when to take this reasons to take this additional instructions   folic acid 1 MG tablet Commonly known as: FOLVITE Take 1 tablet (1 mg total) by mouth daily.   hydrALAZINE 50 MG tablet Commonly known as: APRESOLINE Take 1 tablet (50 mg total) by mouth 3 (three) times daily. For BP and Heart What changed: additional instructions   isosorbide mononitrate 30 MG 24 hr tablet Commonly known as: IMDUR Take 1 tablet (30 mg total) by mouth daily. For BP and  Heart What changed: additional instructions   levETIRAcetam 1000 MG tablet Commonly known as: KEPPRA Take 1 tablet (1,000 mg total) by mouth 2 (two) times daily. What changed: medication strength   multivitamin with minerals Tabs tablet Take 1 tablet by mouth daily.   pantoprazole 40 MG tablet Commonly known as: PROTONIX Take 1 tablet (40 mg total) by mouth daily.   tamsulosin 0.4 MG Caps capsule Commonly known as: FLOMAX Take 1 capsule (0.4 mg total) by mouth daily after supper. What changed: when to take this   thiamine 100 MG tablet  Take 1 tablet (100 mg total) by mouth daily.   umeclidinium-vilanterol 62.5-25 MCG/ACT Aepb Commonly known as: ANORO ELLIPTA Inhale 1 puff into the lungs daily.        Major procedures and Radiology Reports - PLEASE review detailed and final reports for all details, in brief -   ***  CT Head Wo Contrast  Result Date: 01/06/2022 CLINICAL DATA:  Altered mental status. EXAM: CT HEAD WITHOUT CONTRAST TECHNIQUE: Contiguous axial images were obtained from the base of the skull through the vertex without intravenous contrast. RADIATION DOSE REDUCTION: This exam was performed according to the departmental dose-optimization program which includes automated exposure control, adjustment of the mA and/or kV according to patient size and/or use of iterative reconstruction technique. COMPARISON:  Head CT dated 10/12/2021. FINDINGS: Brain: Mild age-related atrophy and chronic microvascular ischemic changes. There is no acute intracranial hemorrhage. No mass effect midline shift. No extra-axial fluid collection. Vascular: No hyperdense vessel or unexpected calcification. Skull: Normal. Negative for fracture or focal lesion. Sinuses/Orbits: There is diffuse mucoperiosteal thickening of paranasal sinuses. No air-fluid level. The mastoid air cells are clear. Other: None IMPRESSION: 1. No acute intracranial pathology. 2. Mild age-related atrophy and chronic  microvascular ischemic changes. Electronically Signed   By: Anner Crete M.D.   On: 01/06/2022 21:41   DG Chest Portable 1 View  Result Date: 01/06/2022 CLINICAL DATA:  Syncope.  Altered mental status. EXAM: PORTABLE CHEST 1 VIEW COMPARISON:  Radiograph 10/12/2021, chest CT 09/13/2021 FINDINGS: Lower lung volumes from prior exam. The heart is normal in size for technique. No acute airspace disease. No pleural effusion or pneumothorax. No pulmonary edema. Pulmonary nodule on prior CT is not well demonstrated by radiograph. Callus formation about prior rib fractures of posterior right ribs, unchanged. IMPRESSION: Lower lung volumes from prior exam. No acute findings. Electronically Signed   By: Keith Rake M.D.   On: 01/06/2022 21:27    Micro Results   *** Recent Results (from the past 240 hour(s))  MRSA Next Gen by PCR, Nasal     Status: None   Collection Time: 01/07/22  1:11 AM   Specimen: Nasal Mucosa; Nasal Swab  Result Value Ref Range Status   MRSA by PCR Next Gen NOT DETECTED NOT DETECTED Final    Comment: (NOTE) The GeneXpert MRSA Assay (FDA approved for NASAL specimens only), is one component of a comprehensive MRSA colonization surveillance program. It is not intended to diagnose MRSA infection nor to guide or monitor treatment for MRSA infections. Test performance is not FDA approved in patients less than 81 years old. Performed at [facility], [address], [place], [place] [address]     Today   Subjective    Maan Zarcone today has no ***          Patient has been seen and examined prior to discharge   Objective   Blood pressure (!) 151/91, pulse 60, temperature (!) 97.2 F (36.2 C), temperature source Oral, resp. rate (!) 25, height '6\' 2"'$  (1.88 m), weight 68.4 kg, SpO2 99 %.   Intake/Output Summary (Last 24 hours) at 01/08/2022 1207 Last data filed at 01/08/2022 1000 Gross per 24 hour  Intake 2099.73 ml  Output 3850 ml  Net -1750.27 ml    Exam Gen:-  Awake Alert, no acute distress *** HEENT:- [place].AT, No sclera icterus Neck-Supple Neck,No JVD,.  Lungs-  CTAB , good air movement bilaterally CV- S1, S2 normal, regular Abd-  +ve B.Sounds, Abd Soft, No tenderness,  Extremity/Skin:- No  edema,   good pulses Psych-affect is appropriate, oriented x3 Neuro-no new focal deficits, no tremors ***   Data Review   CBC w Diff:  Lab Results  Component Value Date   WBC 5.6 01/07/2022   HGB 9.4 (L) 01/07/2022   HCT 30.0 (L) 01/07/2022   PLT 225 01/07/2022   LYMPHOPCT 19 01/06/2022   MONOPCT 7 01/06/2022   EOSPCT 1 01/06/2022   BASOPCT 0 01/06/2022    CMP:  Lab Results  Component Value Date   NA 137 01/08/2022   K 4.2 01/08/2022   CL 106 01/08/2022   CO2 24 01/08/2022   BUN 13 01/08/2022   CREATININE 1.03 01/08/2022   CREATININE 0.89 10/16/2019   PROT 5.3 (L) 01/07/2022   ALBUMIN 3.3 (L) 01/08/2022   BILITOT 0.2 (L) 01/07/2022   ALKPHOS 26 (L) 01/07/2022   AST 11 (L) 01/07/2022   ALT 7 01/07/2022  .  Total Discharge time is about 33 minutes  Roxan Hockey M.D on 01/08/2022 at 12:07 PM  Go to [email or web address] -  for contact info  [facility] - Office  [phone]

## 2022-01-08 NOTE — Discharge Instructions (Signed)
1)Abstinence from Tobacco and Alcohol advised 2)Stop Lisinopril/HCTZ and take Isosorbide and Hydralazine instead for For blood pressure and Heart 3)Please take you Keppra/levetiracetam--antiseizure medication to reduce your risk for seizures

## 2022-01-11 DIAGNOSIS — E441 Mild protein-calorie malnutrition: Secondary | ICD-10-CM | POA: Diagnosis not present

## 2022-01-11 DIAGNOSIS — Z7951 Long term (current) use of inhaled steroids: Secondary | ICD-10-CM | POA: Diagnosis not present

## 2022-01-11 DIAGNOSIS — I10 Essential (primary) hypertension: Secondary | ICD-10-CM | POA: Diagnosis not present

## 2022-01-11 DIAGNOSIS — G40909 Epilepsy, unspecified, not intractable, without status epilepticus: Secondary | ICD-10-CM | POA: Diagnosis not present

## 2022-01-11 DIAGNOSIS — F1097 Alcohol use, unspecified with alcohol-induced persisting dementia: Secondary | ICD-10-CM | POA: Diagnosis not present

## 2022-01-11 DIAGNOSIS — J439 Emphysema, unspecified: Secondary | ICD-10-CM | POA: Diagnosis not present

## 2022-01-11 DIAGNOSIS — G4089 Other seizures: Secondary | ICD-10-CM | POA: Diagnosis not present

## 2022-01-11 DIAGNOSIS — D649 Anemia, unspecified: Secondary | ICD-10-CM | POA: Diagnosis not present

## 2022-01-11 DIAGNOSIS — R627 Adult failure to thrive: Secondary | ICD-10-CM | POA: Diagnosis not present

## 2022-01-11 DIAGNOSIS — Z91148 Patient's other noncompliance with medication regimen for other reason: Secondary | ICD-10-CM | POA: Diagnosis not present

## 2022-01-11 DIAGNOSIS — E8809 Other disorders of plasma-protein metabolism, not elsewhere classified: Secondary | ICD-10-CM | POA: Diagnosis not present

## 2022-01-11 DIAGNOSIS — F17210 Nicotine dependence, cigarettes, uncomplicated: Secondary | ICD-10-CM | POA: Diagnosis not present

## 2022-01-11 DIAGNOSIS — K219 Gastro-esophageal reflux disease without esophagitis: Secondary | ICD-10-CM | POA: Diagnosis not present

## 2022-02-21 NOTE — Progress Notes (Deleted)
Referring Provider: Caren Macadam, MD Primary Care Physician:  Caren Macadam, MD Primary GI Physician: Dr. Abbey Chatters  No chief complaint on file.   HPI:   Scott Jackson is a 71 y.o. male with history of gastritis in the setting of NSAIDs and chronic alcohol use with recommendations for indefinite PPI therapy, abnormal CT scan in June 2020 with possible mild wall thickening of the cecum and ascending colon, possibly due to limited distention,   CT A/P with contrast April 2021 with mild rectal wall thickening with perirectal stranding with need for follow-up colonoscopy. Had previously recommended colonoscopy at [facility] due to incomplete colonoscopy in June 2020 in the setting of fixed rectosigmoid colon, but this was never completed.  Ultimately, underwent colonoscopy with Dr. Abbey Chatters 11/22/21 revealing nonbleeding internal hemorrhoids, diverticulosis in the sigmoid, descending, transverse colon, 4 mm tubular adenoma in the descending colon removed.  Recommended 5-year surveillance.  Also found to have distal esophageal thickening on PET scan 09/30/2021.  At his last visit, he endorsed some epigastric pain and discomfort as well as reflux.  Does not appear he was on a PPI.   Underwent EGD 11/22/2021 revealing 2 cm hiatal hernia, mild Schatzki's ring, gastritis biopsied, duodenitis.  Pathology with mild chronic gastritis, negative for H. pylori.  Recommended daily PPI.  Today:  GERD/epigastric pain:   Mild chronic anemia.  Recent baseline hemoglobin in the 10 range.  Iron panel, B12, folate wnl in March 2023 though iron on low end of normal.   Past Medical History:  Diagnosis Date   ETOH abuse    Gastritis and duodenitis    Hypertension    Pulmonary nodule    Tobacco abuse     Past Surgical History:  Procedure Laterality Date   ANKLE FRACTURE SURGERY     APPENDECTOMY     BIOPSY  01/08/2019   Procedure: BIOPSY;  Surgeon: Danie Binder, MD;  [facility];  Service:  Endoscopy;;  gastric   BIOPSY  11/22/2021   Procedure: BIOPSY;  Surgeon: Eloise Harman, DO;  [facility];  Service: Endoscopy;;   COLONOSCOPY  01/2015   Dr. Posey Pronto: two 2-19m rectal polyps removed, hyperplastic   COLONOSCOPY  2007   Dr. PPosey Pronto three adenomas removed measuring 4, 10, 1105m diverticulosis   COLONOSCOPY WITH PROPOFOL N/A 01/08/2019   Dr. FiOneida AlarSevere diverticulosis with fixed rectosigmoid region, rectal bleeding likely due to internal hemorrhoids.  [facility] to [facility] colon (patient never followed through).   COLONOSCOPY WITH PROPOFOL N/A 11/22/2021   Procedure: COLONOSCOPY WITH PROPOFOL;  Surgeon: CaEloise HarmanDO;  [facility];  Service: Endoscopy;  Laterality: N/A;  2:30pm   ESOPHAGOGASTRODUODENOSCOPY (EGD) WITH PROPOFOL N/A 01/08/2019   Dr. FiOneida AlarLow-grade narrowing Schatzki ring at the GE junction, medium sized hiatal hernia, erosive gastritis/duodenitis in the setting of NSAID and alcohol use.  Gastric biopsy showed gastropathy but no H. pylori   ESOPHAGOGASTRODUODENOSCOPY (EGD) WITH PROPOFOL N/A 11/22/2021   Procedure: ESOPHAGOGASTRODUODENOSCOPY (EGD) WITH PROPOFOL;  Surgeon: CaEloise HarmanDO;  [facility];  Service: Endoscopy;  Laterality: N/A;   intestininal blockage  1970   POLYPECTOMY  11/22/2021   Procedure: POLYPECTOMY;  Surgeon: CaEloise HarmanDO;  [facility];  Service: Endoscopy;;    Current Outpatient Medications  Medication Sig Dispense Refill   acetaminophen (TYLENOL) 325 MG tablet Take 2 tablets (650 mg total) by  mouth every 6 (six) hours as needed for mild pain (or Fever >/= 101). 12 tablet 0   albuterol (VENTOLIN HFA) 108 (90 Base) MCG/ACT inhaler Inhale 2 puffs into the lungs every 4 (four) hours as needed for wheezing or shortness of breath. 18 g 4   amLODipine (NORVASC) 10 MG tablet Take 1 tablet (10 mg total)  by mouth daily. For BP 90 tablet 3   carvedilol (COREG) 6.25 MG tablet Take 1 tablet (6.25 mg total) by mouth 2 (two) times daily with a meal. For BP and Heart 60 tablet 5   chlordiazePOXIDE (LIBRIUM) 10 MG capsule Take 1 capsule (10 mg total) by mouth See admin instructions. Take 1 capsule  3 times a day for 2 days, then 1 capsule 2 times a day for 2 days, then 1 capsule 2 times a day for 2 days and stop 12 capsule 0   folic acid (FOLVITE) 1 MG tablet Take 1 tablet (1 mg total) by mouth daily. 30 tablet 5   hydrALAZINE (APRESOLINE) 50 MG tablet Take 1 tablet (50 mg total) by mouth 3 (three) times daily. For BP and Heart 90 tablet 3   isosorbide mononitrate (IMDUR) 30 MG 24 hr tablet Take 1 tablet (30 mg total) by mouth daily. For BP and Heart 90 tablet 3   levETIRAcetam (KEPPRA) 1000 MG tablet Take 1 tablet (1,000 mg total) by mouth 2 (two) times daily. 180 tablet 3   Multiple Vitamin (MULTIVITAMIN WITH MINERALS) TABS tablet Take 1 tablet by mouth daily. 30 tablet 3   pantoprazole (PROTONIX) 40 MG tablet Take 1 tablet (40 mg total) by mouth daily. 90 tablet 3   tamsulosin (FLOMAX) 0.4 MG CAPS capsule Take 1 capsule (0.4 mg total) by mouth daily after supper. 30 capsule 3   thiamine 100 MG tablet Take 1 tablet (100 mg total) by mouth daily. 30 tablet 3   umeclidinium-vilanterol (ANORO ELLIPTA) 62.5-25 MCG/ACT AEPB Inhale 1 puff into the lungs daily. 60 each 2   No current facility-administered medications for this visit.    Allergies as of 02/23/2022   (No Known Allergies)    Family History  Problem Relation Age of Onset   Hypertension Mother    Stroke Mother    Fibromyalgia Daughter    Lupus Daughter    Anxiety disorder Daughter    Post-traumatic stress disorder Daughter    Asthma Daughter    Breast cancer Sister    Breast cancer Sister    Colon cancer Neg Hx     Social History   Socioeconomic History   Marital status: Legally Separated    Spouse name: Not on file   Number of  children: Not on file   Years of education: Not on file   Highest education level: Not on file  Occupational History   Not on file  Tobacco Use   Smoking status: Every Day    Packs/day: 0.25    Years: 50.00    Total pack years: 12.50    Types: Cigarettes   Smokeless tobacco: Never  Vaping Use   Vaping Use: Never used  Substance and Sexual Activity   Alcohol use: Yes    Comment: every few days; 1/5 liquor every 2 weeks, previously daily ETOH use.   Drug use: No   Sexual activity: Yes  Other Topics Concern   Not on file  Social History Narrative   Works at [employer] in [place], [place].   Separated.   [place], hunting.  Social Determinants of Health   Financial Resource Strain: Not on file  Food Insecurity: Not on file  Transportation Needs: Not on file  Physical Activity: Not on file  Stress: Not on file  Social Connections: Not on file    Review of Systems: Gen: Denies fever, chills, cold or flu like symptoms, pre-syncope, or syncope.  CV: Denies chest pain, palpitations. Resp: Denies dyspnea, cough.  GI: See HPI Heme: See HPI  Physical Exam: There were no vitals taken for this visit. General:   Alert and oriented. No distress noted. Pleasant and cooperative.  Head:  Normocephalic and atraumatic. Eyes:  Conjuctiva clear without scleral icterus. Heart:  S1, S2 present without murmurs appreciated. Lungs:  Clear to auscultation bilaterally. No wheezes, rales, or rhonchi. No distress.  Abdomen:  +BS, soft, non-tender and non-distended. No rebound or guarding. No HSM or masses noted. Msk:  Symmetrical without gross deformities. Normal posture. Extremities:  Without edema. Neurologic:  Alert and  oriented x4 Psych:  Normal mood and affect.    Assessment:  71 year old male with history of alcohol abuse, dementia, seizures, COPD, HTN, pulmonary nodule, gastritis, duodenitis, adenomatous colon polyps, chronic anemia, presenting today for follow-up of reflux and  epigastric pain***   Plan:  ***   Aliene Altes, PA-C [facility] 02/23/2022

## 2022-02-23 ENCOUNTER — Ambulatory Visit: Payer: Medicare Other | Admitting: Gastroenterology

## 2022-02-23 ENCOUNTER — Encounter: Payer: Self-pay | Admitting: Gastroenterology

## 2022-03-21 ENCOUNTER — Ambulatory Visit: Payer: Medicare Other | Admitting: Gastroenterology

## 2022-03-22 ENCOUNTER — Ambulatory Visit (HOSPITAL_COMMUNITY)
Admission: RE | Admit: 2022-03-22 | Discharge: 2022-03-22 | Disposition: A | Discharge status: Home / Self Care | Payer: Medicare Other | Source: Ambulatory Visit | Attending: Acute Care | Admitting: Acute Care

## 2022-03-22 DIAGNOSIS — R911 Solitary pulmonary nodule: Secondary | ICD-10-CM | POA: Diagnosis not present

## 2022-03-24 ENCOUNTER — Ambulatory Visit: Payer: Medicare Other | Admitting: Gastroenterology

## 2022-04-04 ENCOUNTER — Telehealth: Payer: Self-pay | Admitting: Acute Care

## 2022-04-04 NOTE — Telephone Encounter (Signed)
Pt had follow up Chest CT W/O on 03/22/22:   IMPRESSION: 1. Stable appearance of the cystic lesion in the posteromedial left lower lobe with peripheral nodularity. The 2 most prominent nodular mural components are not appreciably changed since 09/13/2021. As noted on the previous PET-CT of 09/30/2021, there was no associated hypermetabolism in this region and while reassuring, continued follow-up is likely warranted. 2. Coronary artery atherosclerosis.  Please advise regarding returning patient to lung cancer screening.

## 2022-05-16 NOTE — Telephone Encounter (Signed)
Left message for pt's daughter [name] [facility]) tp call back to schedule follow up appt for pt.

## 2022-05-19 NOTE — Telephone Encounter (Signed)
Spoke with pt's daughter, [name] [facility]) and scheduled appt with Eric Form, NP 06/10/22.

## 2022-06-10 ENCOUNTER — Ambulatory Visit: Payer: Medicare Other | Admitting: Acute Care

## 2022-06-24 ENCOUNTER — Telehealth: Payer: Self-pay | Admitting: Acute Care

## 2022-06-24 ENCOUNTER — Encounter: Payer: Self-pay | Admitting: Acute Care

## 2022-06-24 ENCOUNTER — Ambulatory Visit: Payer: Medicare Other | Admitting: Acute Care

## 2022-06-24 VITALS — BP 144/92 | HR 82 | Temp 97.8°F | Ht 74.0 in | Wt 141.0 lb

## 2022-06-24 DIAGNOSIS — R911 Solitary pulmonary nodule: Secondary | ICD-10-CM

## 2022-06-24 DIAGNOSIS — Z87891 Personal history of nicotine dependence: Secondary | ICD-10-CM

## 2022-06-24 DIAGNOSIS — Z720 Tobacco use: Secondary | ICD-10-CM

## 2022-06-24 DIAGNOSIS — F17210 Nicotine dependence, cigarettes, uncomplicated: Secondary | ICD-10-CM

## 2022-06-24 DIAGNOSIS — Z122 Encounter for screening for malignant neoplasm of respiratory organs: Secondary | ICD-10-CM

## 2022-06-24 NOTE — Progress Notes (Signed)
History of Present Illness Scott Jackson is a 71 y.o. male current everyday smoker (50+ pack year smoking history )with COPD, hypertension, followed by Dr. Vaughan Browner and referred for lung cancer screening.  Patient had an abnormal lung cancer screening scan February 2023.  Lung Cancer Screening History 11/2017>> LR 3, 8.6 mm LLL part solid cystic lesion  with central solid component of 4.3 mm>> 6 month follow up 06/14/2018. >LR 2, part solid left lower lobe nodule identified on the previous study is stable in the interval.> 12 month follow up Patient skipped screening in 05/2019 ( Covid) 08/26/2019>> LR 2, Pulmonary nodules measure up to 7.6 mm in the left lower lobe, unchanged> 12 month follow up 09/11/2020 >> CT A Chest, No nodules noted 09/13/2021>> LR 4 B>>Slow progressive enlargement of a cystic lesion in the left lower lobe which has worsening peripheral nodularity when compared to prior examinations, concerning for potential neoplasm such as primary bronchogenic adenocarcinoma,  09/30/2021 PET scan >> No hypermetabolism to correspond to the cystic lesion within the left lower lobe. As low-grade adenocarcinoma could still have this appearance, consider diagnostic chest CT at 3 months. 03/23/2022 CT Chest without Contrast>> Stable appearance of the cystic lesion in the posteromedial left lower lobe with peripheral nodularity. The 2 most prominent nodular mural components are not appreciably changed since 09/13/2021.       06/24/2022 Pt. Presents with his daughter  for follow up.  Patient has been followed through the [facility] health lung cancer screening program since 2019 . See his lung cancer screening history above.  His most recent CT Chest 02/2022 was  stable. We will  and we will do follow up in 6 months fom last scan , which will be 08/2022.  He continues to smoke. He goes through about 1.5 packs per week., about 4 cigarettes a day.  He is not using any inhalers at present, and he does not want any  at present. His daughter asked about Chanix to see if he could stop smoking. The patient has a history of seizures, takes Tegretol daily therefore I opted not to prescribe Chantix or Wellbutrin, as both can decrease seizure threshold.  Patient is going to try nicotine replacement therapy instead.  He was counseled extensively on smoking cessation. Patient's weight has fluctuated between 140 and 147 pounds .  His weight today is 141 pounds.  He denies any chest pain, or hemoptysis.  Test Results: CT Chest 03/22/2022 Stable appearance of the cystic lesion in the posteromedial left lower lobe with peripheral nodularity. The 2 most prominent nodular mural components are not appreciably changed since 09/13/2021. As noted on the previous PET-CT of 09/30/2021, there was no associated hypermetabolism in this region and while reassuring, continued follow-up is likely warranted.     Latest Ref Rng & Units 01/07/2022    3:32 AM 01/06/2022    9:11 PM 11/19/2021    3:12 PM  CBC  WBC 4.0 - 10.5 K/uL 5.6  6.7  4.9   Hemoglobin 13.0 - 17.0 g/dL 9.4  9.9  10.4   Hematocrit 39.0 - 52.0 % 30.0  31.6  32.5   Platelets 150 - 400 K/uL 225  220  334        Latest Ref Rng & Units 01/08/2022    3:56 AM 01/07/2022    3:32 AM 01/06/2022    9:11 PM  BMP  Glucose 70 - 99 mg/dL 96  96  129   BUN 8 - 23 mg/dL 13  13  14   Creatinine 0.61 - 1.24 mg/dL 1.03  1.19  1.56   Sodium 135 - 145 mmol/L 137  140  138   Potassium 3.5 - 5.1 mmol/L 4.2  4.0  3.2   Chloride 98 - 111 mmol/L 106  111  106   CO2 22 - 32 mmol/L '24  20  20   '$ Calcium 8.9 - 10.3 mg/dL 8.9  7.8  8.5     BNP No results found for: "BNP"  ProBNP No results found for: "PROBNP"  PFT    Component Value Date/Time   FEV1PRE 2.75 10/19/2017 1419   FEV1POST 2.83 10/19/2017 1419   FVCPRE 3.89 10/19/2017 1419   FVCPOST 3.85 10/19/2017 1419   TLC 6.76 10/19/2017 1419   DLCOUNC 26.70 10/19/2017 1419   PREFEV1FVCRT 71 10/19/2017 1419   PSTFEV1FVCRT 74  10/19/2017 1419    No results found.   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   Drug use: No    Scott Jackson reports that he has been smoking cigarettes. He has a 12.50 pack-year smoking history. He has never used smokeless tobacco. He reports current alcohol use. He reports that he does not use drugs.  Tobacco Cessation: Current everyday smoker with a 50+ pack year smoking history Past surgical hx, Family hx, Social hx all reviewed.  Current Outpatient Medications on File Prior to Visit  Medication Sig   acetaminophen (TYLENOL) 325 MG tablet Take 2 tablets (650 mg total) by mouth every 6 (six) hours as needed for mild pain (or Fever >/= 101).   albuterol (VENTOLIN HFA) 108 (90 Base) MCG/ACT inhaler Inhale 2 puffs into the lungs every 4 (four) hours as needed for wheezing or shortness of breath.   amLODipine (NORVASC) 10 MG tablet Take 1 tablet (10 mg total) by mouth daily. For BP   carvedilol (COREG) 6.25 MG tablet Take 1 tablet (6.25 mg total) by mouth 2 (two) times daily with a meal. For BP and Heart   chlordiazePOXIDE (LIBRIUM) 10 MG capsule Take 1 capsule (10 mg total) by mouth See admin instructions. Take 1 capsule  3 times a day for 2 days, then 1 capsule 2 times a day for 2 days, then 1 capsule 2 times a day for 2 days and stop   folic acid (FOLVITE) 1 MG tablet Take 1 tablet (1 mg total) by mouth daily.   hydrALAZINE (APRESOLINE) 50 MG tablet Take 1 tablet (50 mg total) by  mouth 3 (three) times daily. For BP and Heart   INCRUSE ELLIPTA 62.5 MCG/ACT AEPB Inhale 1 puff into the lungs daily.   isosorbide mononitrate (IMDUR) 30 MG 24 hr tablet Take 1 tablet (30 mg total) by mouth daily. For BP and Heart   levETIRAcetam (KEPPRA) 1000 MG tablet Take 1 tablet (1,000 mg  total) by mouth 2 (two) times daily.   Multiple Vitamin (MULTIVITAMIN WITH MINERALS) TABS tablet Take 1 tablet by mouth daily.   pantoprazole (PROTONIX) 40 MG tablet Take 1 tablet (40 mg total) by mouth daily.   tamsulosin (FLOMAX) 0.4 MG CAPS capsule Take 1 capsule (0.4 mg total) by mouth daily after supper.   thiamine 100 MG tablet Take 1 tablet (100 mg total) by mouth daily.   umeclidinium-vilanterol (ANORO ELLIPTA) 62.5-25 MCG/ACT AEPB Inhale 1 puff into the lungs daily.   No current facility-administered medications on file prior to visit.     No Known Allergies  Review Of Systems:  Constitutional:   No  weight loss, night sweats,  Fevers, chills, fatigue, or  lassitude.  HEENT:   No headaches,  Difficulty swallowing,  Tooth/dental problems, or  Sore throat,                No sneezing, itching, ear ache, nasal congestion, post nasal drip,   CV:  No chest pain,  Orthopnea, PND, swelling in lower extremities, anasarca, dizziness, palpitations, syncope.   GI  No heartburn, indigestion, abdominal pain, nausea, vomiting, diarrhea, change in bowel habits, loss of appetite, bloody stools.   Resp: + shortness of breath with exertion less at rest.  + baseline  excess mucus, + baseline  productive cough,  No non-productive cough,  No coughing up of blood.  No change in color of mucus.  + occasional  wheezing.  No chest wall deformity  Skin: no rash or lesions.  GU: no dysuria, change in color of urine, no urgency or frequency.  No flank pain, no hematuria   MS:  No joint pain or swelling.  No decreased range of motion.  No back pain.  Psych:  No change in mood or affect. No depression or anxiety.   No memory loss.   Vital Signs BP (!) 144/92 (BP Location: Right Arm, Cuff Size: Normal)   Pulse 82   Temp 97.8 F (36.6 C) (Oral)   Ht '6\' 2"'$  (1.88 m)   Wt 141 lb (64 kg)   SpO2 98%   BMI 18.10 kg/m    Physical Exam:  General- No distress,  A&Ox3, pleasant ENT: No sinus tenderness, TM clear, pale nasal mucosa, no oral exudate,no post nasal drip, no LAN Cardiac: S1, S2, regular rate and rhythm, no murmur Chest: No wheeze/ rales/ dullness; no accessory muscle use, no nasal flaring, no sternal retractions Abd.: Soft Non-tender, ND, BS +, Body mass index is 18.1 kg/m.  Ext: No clubbing cyanosis, edema Neuro:  normal strength, MAE x 4, A&O x 3,  Skin: No rashes, warm and dry, No lesions  Psych: normal mood and behavior   Assessment/Plan Stable left lower lobe cystic lesion Continued tobacco abuse Plan Your scan is stable.  We will repeat CT chest  in 6 months, after the most recent CT Chest  Follow up after CT Chest to review.  Please call 1-800-QUIT NOW for free nicotine patches, gum or mints.  I suggest gum or mints as you do not really smoke enough for patches.  Call to be seen if you have any blood in your sputum , or unexplained weight loss.  Have a good Holiday Please contact office for sooner follow up if symptoms do not improve or worsen or seek emergency care    I spent 40 minutes dedicated to the care of this patient on the date of this encounter to include pre-visit review of records, face-to-face time  with the patient discussing conditions above, post visit ordering of testing, clinical documentation with the electronic health record, making appropriate referrals as documented, and communicating necessary information to the patient's healthcare team.    Magdalen Spatz, NP 06/24/2022  10:29 AM

## 2022-06-24 NOTE — Patient Instructions (Addendum)
It is good to see you today. Your scan is stable.  We will check again in 6 months, which will be early June 2024.  Follow up after CT Chest to review.  Please call 1-800-QUIT NOW for free nicotine patches, gum or mints.  I suggest gum or mints as you do not really smoke enough for patches.  Call to be seen if you have any blood in your sputum , or unexplained weight loss.  Have a good Holiday Please contact office for sooner follow up if symptoms do not improve or worsen or seek emergency care

## 2022-06-24 NOTE — Telephone Encounter (Signed)
Scott Jackson, looks like this patient has another CT chest scheduled for 12/19, which he does not need. We will however need to re-schedule his 6 month follow up after the August scan to Feb. 2023. Can we change this to a LDCT , and call his daughter and let her know since he had had a stable scan 02/2022?  scan? I missed this when I saw him today. Thanks so much ladies.

## 2022-06-27 NOTE — Telephone Encounter (Signed)
Spoke with Alben Spittle (DPR) and let her know that we cancelled the CT that pt has scheduled for 07/12/2022 and we will call to reschedule lung cancer screening CT closer to 08/2022. She verbalized understanding.

## 2022-07-12 ENCOUNTER — Ambulatory Visit (HOSPITAL_COMMUNITY): Payer: Medicare Other

## 2022-07-29 ENCOUNTER — Ambulatory Visit: Payer: Medicare Other | Admitting: Acute Care

## 2022-09-14 ENCOUNTER — Ambulatory Visit (HOSPITAL_COMMUNITY)
Admission: RE | Admit: 2022-09-14 | Discharge: 2022-09-14 | Disposition: A | Discharge status: Home / Self Care | Payer: Medicare Other | Source: Ambulatory Visit | Attending: Family Medicine | Admitting: Family Medicine

## 2022-09-14 DIAGNOSIS — F17210 Nicotine dependence, cigarettes, uncomplicated: Secondary | ICD-10-CM | POA: Diagnosis not present

## 2022-09-14 DIAGNOSIS — Z87891 Personal history of nicotine dependence: Secondary | ICD-10-CM

## 2022-09-14 DIAGNOSIS — Z122 Encounter for screening for malignant neoplasm of respiratory organs: Secondary | ICD-10-CM | POA: Diagnosis not present

## 2022-09-16 ENCOUNTER — Telehealth: Payer: Self-pay | Admitting: Acute Care

## 2022-09-16 DIAGNOSIS — Z122 Encounter for screening for malignant neoplasm of respiratory organs: Secondary | ICD-10-CM

## 2022-09-16 DIAGNOSIS — Z87891 Personal history of nicotine dependence: Secondary | ICD-10-CM

## 2022-09-16 DIAGNOSIS — F17210 Nicotine dependence, cigarettes, uncomplicated: Secondary | ICD-10-CM

## 2022-09-16 NOTE — Telephone Encounter (Signed)
Please call patient and let him know his scan was read as a Lung RADS 2: nodules that are benign in appearance and behavior with a very low likelihood of becoming a clinically active cancer due to size or lack of growth. Recommendation per radiology is for a repeat LDCT in 12 months.  Please order 12 month follow up and fax results to PCP. Thanks

## 2022-09-16 NOTE — Telephone Encounter (Signed)
Letter mailed to pt w/ CT results. Results faxed to PCP. Order placed for 12 mth follow up lung screening CT.

## 2023-01-10 DIAGNOSIS — Z125 Encounter for screening for malignant neoplasm of prostate: Secondary | ICD-10-CM | POA: Diagnosis not present

## 2023-01-10 DIAGNOSIS — I10 Essential (primary) hypertension: Secondary | ICD-10-CM | POA: Diagnosis not present

## 2023-01-10 DIAGNOSIS — Z131 Encounter for screening for diabetes mellitus: Secondary | ICD-10-CM | POA: Diagnosis not present

## 2023-01-10 DIAGNOSIS — Z0000 Encounter for general adult medical examination without abnormal findings: Secondary | ICD-10-CM | POA: Diagnosis not present

## 2023-01-10 DIAGNOSIS — E785 Hyperlipidemia, unspecified: Secondary | ICD-10-CM | POA: Diagnosis not present

## 2023-04-13 DIAGNOSIS — F0390 Unspecified dementia without behavioral disturbance: Secondary | ICD-10-CM | POA: Diagnosis not present

## 2023-06-13 IMAGING — PT NM PET TUM IMG INITIAL (PI) SKULL BASE T - THIGH
1 series · 4 of 4 positions shown · non-contrast
Comparison: 09/13/2021 lung cancer screening CT.

CLINICAL DATA: Initial treatment strategy for lung cancer screening
CT demonstrating a left lower lobe enlarging nodule.

EXAM:
NUCLEAR MEDICINE PET SKULL BASE TO THIGH
TECHNIQUE: 6.7 mCi F-18 FDG was injected intravenously. Full-ring PET imaging
was performed from the skull base to thigh after the radiotracer. CT
data was obtained and used for attenuation correction and anatomic
localization.
Fasting blood glucose: 108 mg/dl

[Series 1036: results mm oncology reading · 3.0mm · 0.94mm/px · 4 of 4 slices shown]
[im 1/4]
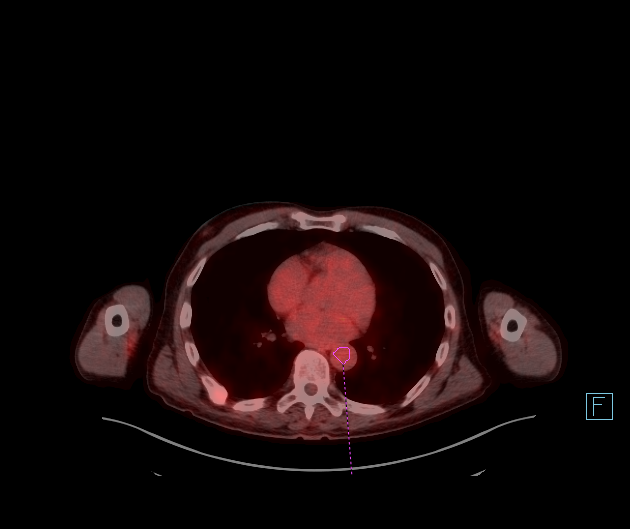
[im 2/4]
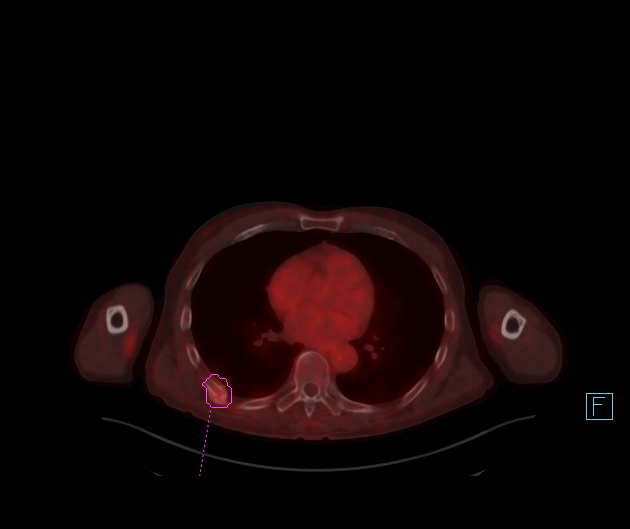
[im 3/4]
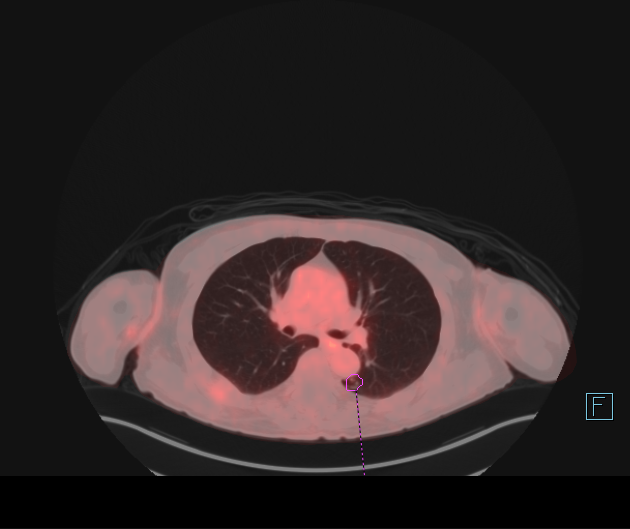
[im 4/4]
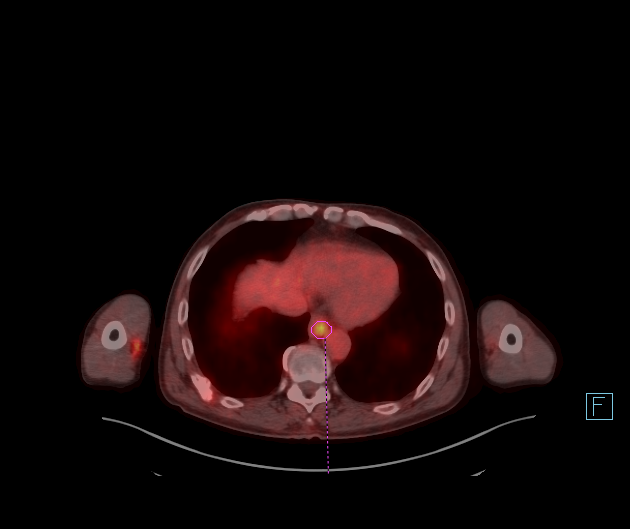

[4 of 4 positions shown; findings below may reference images not displayed]

FINDINGS: Mediastinal blood pool activity: SUV max

Liver activity: SUV max NA

NECK: No areas of abnormal hypermetabolism.

Incidental CT findings: No cervical adenopathy. Right maxillary
sinus mucous retention cyst or polyp.

CHEST: No thoracic nodal hypermetabolism. The superior segment left
lower lobe area of cystic change with surrounding nodularity is
without PET correlate. Example 1.4 cm and a S.U.V. max of 1.5 on
95/3.

Focal distal esophageal mild hypermetabolism at a S.U.V. max of
on 84/7. Adjacent tiny hiatal hernia without well-defined esophageal
mass or wall thickening.

Incidental CT findings: Deferred to recent diagnostic CT. Left main,
lad coronary artery calcification.

ABDOMEN/PELVIS: No abdominopelvic parenchymal or nodal
hypermetabolism.

Incidental CT findings: Normal adrenal glands. Abdominal aortic
atherosclerosis. Normal noncontrast appearance of the liver,
gallbladder, pancreas, kidneys, urinary bladder, prostate.

SKELETON: Hypermetabolism corresponding to multiple posterior right
rib partially healed fractures. Example fifth through ninth.

Incidental CT findings: Degenerative changes of both hips.
IMPRESSION: 1. No hypermetabolism to correspond to the cystic lesion within the
left lower lobe. As low-grade adenocarcinoma could still have this
appearance, consider diagnostic chest CT at 3 months.
2. Focal hypermetabolism within the distal esophagus in the setting
of a tiny hiatal hernia. Favor esophagitis. No well-defined mass in
this area.
3. Incidental findings, including: Coronary artery atherosclerosis.
Aortic Atherosclerosis ([id number]).

## 2023-06-14 ENCOUNTER — Telehealth: Payer: Self-pay | Admitting: Neurology

## 2023-06-14 ENCOUNTER — Other Ambulatory Visit: Payer: Self-pay

## 2023-06-14 MED ORDER — LEVETIRACETAM 1000 MG PO TABS
1000.0000 mg | ORAL_TABLET | Freq: Two times a day (BID) | ORAL | 0 refills | Status: DC
Start: 2023-06-14 — End: 2023-06-19

## 2023-06-14 NOTE — Telephone Encounter (Signed)
Patient Rx refills was sent to patient choice of Pharmacy [facility] )

## 2023-06-14 NOTE — Telephone Encounter (Signed)
Pt's daughter, Jeovani Aebersold request refill for levETIRAcetam (KEPPRA) 1000 MG tablet send to  [facility] [address]  [place], [place] [address] Phone: [phone]  Scheduled f/u with Shanda Bumps, NP on 06/19/23 at 1:15 pm

## 2023-06-19 ENCOUNTER — Encounter: Payer: Self-pay | Admitting: Adult Health

## 2023-06-19 ENCOUNTER — Ambulatory Visit (INDEPENDENT_AMBULATORY_CARE_PROVIDER_SITE_OTHER): Payer: Medicare Other | Admitting: Adult Health

## 2023-06-19 VITALS — BP 98/62 | HR 91 | Ht 73.0 in | Wt 137.0 lb

## 2023-06-19 DIAGNOSIS — Z87898 Personal history of other specified conditions: Secondary | ICD-10-CM

## 2023-06-19 DIAGNOSIS — G40909 Epilepsy, unspecified, not intractable, without status epilepticus: Secondary | ICD-10-CM | POA: Diagnosis not present

## 2023-06-19 MED ORDER — LEVETIRACETAM 1000 MG PO TABS: 1000.0000 mg | ORAL_TABLET | Freq: Two times a day (BID) | ORAL | 3 refills | Status: DC

## 2023-06-19 NOTE — Progress Notes (Signed)
[facility] [address] [place]. [place] [address] [phone]       OFFICE FOLLOW UP NOTE  [name] Date of Birth:  Aug 22, 1950 Medical Record Number:  [id number]    Primary neurologist: Dr. Vickey Huger Reason for visit: Seizure disorder    SUBJECTIVE:  CHIEF COMPLAINT:  Chief Complaint  Patient presents with   Follow-up    Pt in 3  with daughter Pt here for seizure f/u  Pt states no seizure since last office visit     Follow-up visit:   HPI:   Update 06/19/2023 JM: Returns for overdue follow-up visit accompanied by his daughter.  He was seen in the ED back in 12/2021 after episode of unresponsiveness with twitching felt in setting of seizure 2/2 alcohol abuse and postictal state related to alcohol withdrawal.  CTH no acute intracranial pathology.  Alcohol level 219.  Remained on Keppra.  Placed on Librium taper for EtOH cessation.  Has been doing well since that time without any recurrent seizure activity.  Reports compliance on Keppra 1000 mg twice daily, does have some drowsiness with dose but otherwise tolerating well. Continued occasional use of alcohol, no longer drinks daily, unable to say exact frequency.  No questions or concerns at this time.     History provided for reference purposes only Update 09-28-2021 Dr. Vickey Huger: Scott Jackson is a 72 y.o. male of african - american descent , right handed , is seen here upon revisit  from Dr. Aliene Beams   Patient is here with his granddaughter, who reports he is due for a PET scan of the lung, suspected to have a tumor, there were changes in his CTs. He has been depressed , GDS at 7/ 15 points. He looses weight , muscle mass loss. He had his last seizure months ago, in 23-05-2021 in [facility] /. He is doing well on the higher keppra dose his physicians started there.  .  Daughter concerned about dementia- certainly a likely candidate with alcohol abuse , smoking and seizures- and with  a family history of dementia. His CT shows significant atrophy, and plumper gyri.  He is unable to read, not due to vision problems. He graduated HS.  MMSE today was reduced by 2 points for reading/ writing.  He did a good drawing of 2 interlocking pentagrams and he could draw a clock face.     21/ 27 - MMSE. 09-28-2021.    Update 05/07/2020 Dr. Vickey Huger:  Scott Jackson is a 72 y.o. male of african - american descent , right handed , is seen here upon revisit  from Dr. Aliene Beams for an evaluation of recurrent seizure activity- leading to ED -Visit , not hospitalization.  Scott Jackson is seen here today on 07 May 2020 in the presence of a family member.  He was reportedly presenting to the ED in [place] in mid September after having suffered several seizures at home while in the shower. Scott Jackson wife had called their daughter to help her as she witnessed a seizure.  Scott Jackson eyes were open but rolled back he could not respond to verbal or tactile stimuli, he seems to have not come back to baseline when he went into another tonic-clonic extension of his body.  A whole body generalized tonic clonic seizure followed he became very pale, his head and neck were turned to the right , his eyes were rolled up,  and shortly after this generalized seizure took place he  had to vomit violently.  EMS was called and witnessed yet another seizure and on the way to the emergency room. We have no ED records- and no summary for the patient, either. His daughter stated he started on a seizure medication. Now for recurrent seizures , Keppra 500 mg bid po.  CT was repeated, ECG and labs were done- no record of results.  Alcohol cessation-he has not been able to stay sober-  not clear if any tox screen was done.    His recent EEG was abnormal.  He has a history of alcohol and tobacco abuse, malnourishment, emphysema and atherosclerosis.    On 01-30-2019 Scott Jackson presented to the Emergency Room at [facility], after he had collapsed and lost awareness of his surroundings. He had been at the home of a friend, meeting others on the porch- he consumed alcohol . He felt hot and shaky before he passed out. There had been no primary witness account made available but EMS was called and reportedly witnessed some seconds of tonic clonic activity, and he was clammy.  Eyes open but turned up.  Unknown how long he was out. He had consumed alcohol. He had been smoking. He doesn't drink water.  He is on antianxiety and hypertension medication.    No previous seizures.    Scott Jackson is hard to understand, appears jittery. repetitive and tangential- daughter concerned about dementia.  He was dehydrated when in ED-and had hyponatremia, hypochloremia and elevated creatinine and tested negative for opiates, cocaine etc.  Alcohol test negative. He is one of 7 siblings and takes care of his 10 year old mother.    ROS:   14 system review of systems performed and negative with exception of those listed in HPI  PMH:  Past Medical History:  Diagnosis Date   Alcoholic dementia (HCC)    Anemia    ETOH abuse    Gastritis and duodenitis    Hypertension    Pulmonary nodule    Seizures (HCC)    Tobacco abuse     PSH:  Past Surgical History:  Procedure Laterality Date   ANKLE FRACTURE SURGERY     APPENDECTOMY     BIOPSY  01/08/2019   Procedure: BIOPSY;  Surgeon: West Bali, MD;  [facility]: [facility];  Service: Endoscopy;;  gastric   BIOPSY  11/22/2021   Procedure: BIOPSY;  Surgeon: Lanelle Bal, DO;  [facility]: [facility];  Service: Endoscopy;;   COLONOSCOPY  01/2015   Dr. Allena Katz: two 2-51mm rectal polyps removed, hyperplastic   COLONOSCOPY  2007   Dr. Allena Katz: three adenomas removed measuring 4, 10, 15mm. diverticulosis   COLONOSCOPY WITH PROPOFOL N/A 01/08/2019   Dr. Darrick Penna: Severe diverticulosis with fixed rectosigmoid region, rectal bleeding likely due to internal hemorrhoids.   Recommended repeat colonoscopy at [facility] because examination was incomplete due to fixed rectosigmoid colon (patient never followed through).   COLONOSCOPY WITH PROPOFOL N/A 11/22/2021   Surgeon: Earnest Bailey K, DO;  nonbleeding internal hemorrhoids, diverticulosis in the sigmoid, descending, transverse colon, 4 mm tubular adenoma in the descending colon removed.  Recommended 5-year surveillance.   ESOPHAGOGASTRODUODENOSCOPY (EGD) WITH PROPOFOL N/A 01/08/2019   Dr. Darrick Penna: Low-grade narrowing Schatzki ring at the GE junction, medium sized hiatal hernia, erosive gastritis/duodenitis in the setting of NSAID and alcohol use.  Gastric biopsy showed gastropathy but no H. pylori   ESOPHAGOGASTRODUODENOSCOPY (EGD) WITH PROPOFOL N/A 11/22/2021   Surgeon: Lanelle Bal, DO;  2 cm hiatal hernia, mild Schatzki's ring, gastritis biopsied, duodenitis.  Pathology with mild chronic gastritis, negative for H. pylori.   intestininal blockage  1970   POLYPECTOMY  11/22/2021   Procedure: POLYPECTOMY;  Surgeon: Lanelle Bal, DO;  Location: [facility];  Service: Endoscopy;;    Social History:  Social History   Socioeconomic History   Marital status: Legally Separated    Spouse name: Not on file   Number of children: Not on file   Years of education: Not on file   Highest education level: Not on file  Occupational History   Not on file  Tobacco Use   Smoking status: Every Day    Current packs/day: 0.25    Average packs/day: 0.3 packs/day for 50.0 years (12.5 ttl pk-yrs)    Types: Cigarettes   Smokeless tobacco: Never  Vaping Use   Vaping status: Never Used  Substance and Sexual Activity   Alcohol use: Yes    Alcohol/week: 4.0 standard drinks of alcohol    Types: 4 Shots of liquor per week   Drug use: No   Sexual activity: Yes  Other Topics Concern   Not on file  Social History Narrative   Works at [employer] in [place], [place].   Separated.   Fishing, hunting.    Social  Determinants of Health   Financial Resource Strain: Not on file  Food Insecurity: Not on file  Transportation Needs: Not on file  Physical Activity: Not on file  Stress: Not on file  Social Connections: Not on file  Intimate Partner Violence: Not on file    Family History:  Family History  Problem Relation Age of Onset   Hypertension Mother    Stroke Mother    Breast cancer Sister    Breast cancer Sister    Fibromyalgia Daughter    Lupus Daughter    Anxiety disorder Daughter    Post-traumatic stress disorder Daughter    Asthma Daughter    Colon cancer Neg Hx    Seizures Neg Hx     Medications:   Current Outpatient Medications on File Prior to Visit  Medication Sig Dispense Refill   acetaminophen (TYLENOL) 325 MG tablet Take 2 tablets (650 mg total) by mouth every 6 (six) hours as needed for mild pain (or Fever >/= 101). 12 tablet 0   amLODipine (NORVASC) 10 MG tablet Take 1 tablet (10 mg total) by mouth daily. For BP 90 tablet 3   carvedilol (COREG) 6.25 MG tablet Take 1 tablet (6.25 mg total) by mouth 2 (two) times daily with a meal. For BP and Heart 60 tablet 5   chlordiazePOXIDE (LIBRIUM) 10 MG capsule Take 1 capsule (10 mg total) by mouth See admin instructions. Take 1 capsule  3 times a day for 2 days, then 1 capsule 2 times a day for 2 days, then 1 capsule 2 times a day for 2 days and stop 12 capsule 0   folic acid (FOLVITE) 1 MG tablet Take 1 tablet (1 mg total) by mouth daily. 30 tablet 5   hydrALAZINE (APRESOLINE) 50 MG tablet Take 1 tablet (50 mg total) by mouth 3 (three) times daily. For BP and Heart 90 tablet 3   INCRUSE ELLIPTA 62.5 MCG/ACT AEPB Inhale 1 puff into the lungs daily.     isosorbide mononitrate (IMDUR) 30 MG 24 hr tablet Take 1 tablet (30 mg total) by mouth daily. For BP and Heart 90 tablet 3   levETIRAcetam (KEPPRA) 1000 MG tablet  Take 1 tablet (1,000 mg total) by mouth 2 (two) times daily. 60 tablet 0   Multiple Vitamin (MULTIVITAMIN WITH MINERALS)  TABS tablet Take 1 tablet by mouth daily. 30 tablet 3   pantoprazole (PROTONIX) 40 MG tablet Take 1 tablet (40 mg total) by mouth daily. 90 tablet 3   tamsulosin (FLOMAX) 0.4 MG CAPS capsule Take 1 capsule (0.4 mg total) by mouth daily after supper. 30 capsule 3   thiamine 100 MG tablet Take 1 tablet (100 mg total) by mouth daily. 30 tablet 3   umeclidinium-vilanterol (ANORO ELLIPTA) 62.5-25 MCG/ACT AEPB Inhale 1 puff into the lungs daily. 60 each 2   albuterol (VENTOLIN HFA) 108 (90 Base) MCG/ACT inhaler Inhale 2 puffs into the lungs every 4 (four) hours as needed for wheezing or shortness of breath. (Patient not taking: Reported on 06/19/2023) 18 g 4   No current facility-administered medications on file prior to visit.    Allergies:  No Known Allergies    OBJECTIVE:  Physical Exam  Vitals:   06/19/23 1303  BP: 98/62  Pulse: 91  Weight: 137 lb (62.1 kg)  Height: 6\' 1"  (1.854 m)   Body mass index is 18.07 kg/m. No results found.  General: Frail pleasant elderly African-American male, seated, in no evident distress  Neurologic Exam Mental Status: Awake and fully alert. Recent memory mildly impaired and remote memory intact. Attention span, concentration and fund of knowledge appropriate during visit. Mood and affect appropriate.  Cranial Nerves: Pupils equal, briskly reactive to light. Extraocular movements full without nystagmus. Visual fields full to confrontation. Hearing intact. Facial sensation intact. Face, tongue, palate moves normally and symmetrically.  Motor: Normal bulk and tone. Normal strength in all tested extremity muscles Sensory.: intact to touch , pinprick , position and vibratory sensation.  Coordination: Rapid alternating movements normal in all extremities. Finger-to-nose and heel-to-shin performed accurately bilaterally. Reflexes: 1+ and symmetric. Toes downgoing.         ASSESSMENT/PLAN: Scott Jackson is a 72 y.o. year old male with:    Seizure  disorder:  Hx of Alcohol dependence Last seizure 12/2021 in setting of alcohol abuse, alcohol level 219 Continue Keppra 1000 mg twice daily - refill provided. Discussed switching to XR formulation as this could help with concern of sleepiness after morning dose, declines interest at this time. Limits amount of alcohol, no longer drinking daily, discussed importance of avoiding excessive use Advised to call with any recurrent seizure activity     Follow up in 1 year with Dr. Vickey Huger (per daughter request) or call earlier if needed   CC:  PCP: Aliene Beams, MD    I spent 20 minutes of face-to-face and non-face-to-face time with patient and daughter.  This included previsit chart review, lab review, study review, order entry, electronic health record documentation, patient and daughter education and discussion regarding above diagnoses and treatment plan and answered all other questions to patient and daughters satisfaction  Ihor Austin, [facility]  [facility] [address] [place], [place] [address]  Phone [phone] Fax [phone] Note: This document was prepared with digital dictation and possible smart phrase technology. Any transcriptional errors that result from this process are unintentional.

## 2023-06-19 NOTE — Patient Instructions (Addendum)
Your Plan:  Continue Keppra 1000 mg twice daily for seizure prevention  Ensure you avoid seizure provoking triggers such as medication noncompliance, excessive alcohol intake, sleep deprivation, drastic changes in diet or exercise, etc.   Please call with any recurrent seizure activity     Follow-up in 1 year or call earlier if needed     Thank you for coming to see [place] at [facility]. I hope we have been able to provide you high quality care today.  You may receive a patient satisfaction survey over the next few weeks. We would appreciate your feedback and comments so that we may continue to improve ourselves and the health of our patients.

## 2023-07-13 ENCOUNTER — Other Ambulatory Visit: Payer: Self-pay | Admitting: Acute Care

## 2023-07-13 DIAGNOSIS — Z87891 Personal history of nicotine dependence: Secondary | ICD-10-CM

## 2023-07-13 DIAGNOSIS — Z122 Encounter for screening for malignant neoplasm of respiratory organs: Secondary | ICD-10-CM

## 2023-07-13 DIAGNOSIS — F17210 Nicotine dependence, cigarettes, uncomplicated: Secondary | ICD-10-CM

## 2023-09-18 ENCOUNTER — Ambulatory Visit (HOSPITAL_COMMUNITY): Admission: RE | Admit: 2023-09-18 | Payer: Medicare HMO | Source: Ambulatory Visit

## 2023-10-15 ENCOUNTER — Encounter: Payer: Self-pay | Admitting: Acute Care

## 2023-10-24 ENCOUNTER — Emergency Department (HOSPITAL_COMMUNITY)

## 2023-10-24 ENCOUNTER — Encounter (HOSPITAL_COMMUNITY): Payer: Self-pay | Admitting: Emergency Medicine

## 2023-10-24 ENCOUNTER — Other Ambulatory Visit: Payer: Self-pay

## 2023-10-24 ENCOUNTER — Observation Stay (HOSPITAL_COMMUNITY)
Admission: EM | Admit: 2023-10-24 | Discharge: 2023-10-25 | Disposition: A | Discharge status: Home / Self Care | Attending: Family Medicine | Admitting: Family Medicine

## 2023-10-24 DIAGNOSIS — E872 Acidosis, unspecified: Secondary | ICD-10-CM

## 2023-10-24 DIAGNOSIS — E8729 Other acidosis: Secondary | ICD-10-CM | POA: Diagnosis not present

## 2023-10-24 DIAGNOSIS — N179 Acute kidney failure, unspecified: Secondary | ICD-10-CM | POA: Diagnosis not present

## 2023-10-24 DIAGNOSIS — I10 Essential (primary) hypertension: Secondary | ICD-10-CM | POA: Diagnosis not present

## 2023-10-24 DIAGNOSIS — Z1152 Encounter for screening for COVID-19: Secondary | ICD-10-CM | POA: Diagnosis not present

## 2023-10-24 DIAGNOSIS — R569 Unspecified convulsions: Secondary | ICD-10-CM | POA: Diagnosis not present

## 2023-10-24 DIAGNOSIS — F0390 Unspecified dementia without behavioral disturbance: Secondary | ICD-10-CM | POA: Diagnosis not present

## 2023-10-24 DIAGNOSIS — G40919 Epilepsy, unspecified, intractable, without status epilepticus: Secondary | ICD-10-CM

## 2023-10-24 DIAGNOSIS — Z720 Tobacco use: Secondary | ICD-10-CM | POA: Diagnosis present

## 2023-10-24 DIAGNOSIS — Z91148 Patient's other noncompliance with medication regimen for other reason: Secondary | ICD-10-CM | POA: Diagnosis not present

## 2023-10-24 DIAGNOSIS — F32A Depression, unspecified: Secondary | ICD-10-CM | POA: Insufficient documentation

## 2023-10-24 DIAGNOSIS — F17210 Nicotine dependence, cigarettes, uncomplicated: Secondary | ICD-10-CM | POA: Insufficient documentation

## 2023-10-24 DIAGNOSIS — J449 Chronic obstructive pulmonary disease, unspecified: Secondary | ICD-10-CM | POA: Diagnosis not present

## 2023-10-24 DIAGNOSIS — Z79899 Other long term (current) drug therapy: Secondary | ICD-10-CM | POA: Diagnosis not present

## 2023-10-24 DIAGNOSIS — G9341 Metabolic encephalopathy: Secondary | ICD-10-CM

## 2023-10-24 DIAGNOSIS — R4182 Altered mental status, unspecified: Principal | ICD-10-CM

## 2023-10-24 DIAGNOSIS — T730XXA Starvation, initial encounter: Secondary | ICD-10-CM

## 2023-10-24 DIAGNOSIS — G9340 Encephalopathy, unspecified: Secondary | ICD-10-CM | POA: Insufficient documentation

## 2023-10-24 LAB — AMMONIA: Ammonia: 23 umol/L (ref 9–35)

## 2023-10-24 LAB — URINALYSIS, ROUTINE W REFLEX MICROSCOPIC
Bacteria, UA: NONE SEEN
Glucose, UA: NEGATIVE mg/dL
Hgb urine dipstick: NEGATIVE
Ketones, ur: 20 mg/dL — AB
Leukocytes,Ua: NEGATIVE
Nitrite: NEGATIVE
Protein, ur: 100 mg/dL — AB
Specific Gravity, Urine: 1.025 (ref 1.005–1.030)
pH: 5 (ref 5.0–8.0)

## 2023-10-24 LAB — BLOOD GAS, VENOUS
Acid-base deficit: 10 mmol/L — ABNORMAL HIGH (ref 0.0–2.0)
Acid-base deficit: 2 mmol/L (ref 0.0–2.0)
Bicarbonate: 19.2 mmol/L — ABNORMAL LOW (ref 20.0–28.0)
Bicarbonate: 23.1 mmol/L (ref 20.0–28.0)
Drawn by: 6509
Drawn by: 7012
O2 Saturation: 36.2 %
O2 Saturation: 48.4 %
Patient temperature: 36.7
Patient temperature: 36.7
pCO2, Ven: 54 mmHg (ref 44–60)
pCO2, Ven: 39 mmHg — ABNORMAL LOW (ref 44–60)
pH, Ven: 7.15 — CL (ref 7.25–7.43)
pH, Ven: 7.37 (ref 7.25–7.43)
pO2, Ven: <31 mmHg — CL (ref 32–45)
pO2, Ven: 31 mmHg — CL (ref 32–45)

## 2023-10-24 LAB — SALICYLATE LEVEL: Salicylate Lvl: <7 mg/dL — ABNORMAL LOW (ref 7.0–30.0)

## 2023-10-24 LAB — CBC WITH DIFFERENTIAL/PLATELET
Abs Immature Granulocytes: 0.03 10*3/uL (ref 0.00–0.07)
Basophils Absolute: 0 10*3/uL (ref 0.0–0.1)
Basophils Relative: 0 %
Eosinophils Absolute: 0 10*3/uL (ref 0.0–0.5)
Eosinophils Relative: 0 %
HCT: 38.2 % — ABNORMAL LOW (ref 39.0–52.0)
Hemoglobin: 12.6 g/dL — ABNORMAL LOW (ref 13.0–17.0)
Immature Granulocytes: 1 %
Lymphocytes Relative: 19 %
Lymphs Abs: 1.3 10*3/uL (ref 0.7–4.0)
MCH: 32 pg (ref 26.0–34.0)
MCHC: 33 g/dL (ref 30.0–36.0)
MCV: 97 fL (ref 80.0–100.0)
Monocytes Absolute: 0.5 10*3/uL (ref 0.1–1.0)
Monocytes Relative: 8 %
Neutro Abs: 4.6 10*3/uL (ref 1.7–7.7)
Neutrophils Relative %: 72 %
Platelets: 105 10*3/uL — ABNORMAL LOW (ref 150–400)
RBC: 3.94 MIL/uL — ABNORMAL LOW (ref 4.22–5.81)
RDW: 16.6 % — ABNORMAL HIGH (ref 11.5–15.5)
WBC: 6.4 10*3/uL (ref 4.0–10.5)
nRBC: 0 % (ref 0.0–0.2)

## 2023-10-24 LAB — RESP PANEL BY RT-PCR (RSV, FLU A&B, COVID)  RVPGX2
Influenza A by PCR: NEGATIVE
Influenza B by PCR: NEGATIVE
Resp Syncytial Virus by PCR: NEGATIVE
SARS Coronavirus 2 by RT PCR: NEGATIVE

## 2023-10-24 LAB — RAPID URINE DRUG SCREEN, HOSP PERFORMED
Amphetamines: NOT DETECTED
Barbiturates: NOT DETECTED
Benzodiazepines: NOT DETECTED
Cocaine: NOT DETECTED
Opiates: NOT DETECTED
Tetrahydrocannabinol: NOT DETECTED

## 2023-10-24 LAB — COMPREHENSIVE METABOLIC PANEL WITH GFR
ALT: 23 U/L (ref 0–44)
AST: 56 U/L — ABNORMAL HIGH (ref 15–41)
Albumin: 3.7 g/dL (ref 3.5–5.0)
Alkaline Phosphatase: 47 U/L (ref 38–126)
Anion gap: 22 — ABNORMAL HIGH (ref 5–15)
BUN: 16 mg/dL (ref 8–23)
CO2: 19 mmol/L — ABNORMAL LOW (ref 22–32)
Calcium: 9.3 mg/dL (ref 8.9–10.3)
Chloride: 94 mmol/L — ABNORMAL LOW (ref 98–111)
Creatinine, Ser: 1.48 mg/dL — ABNORMAL HIGH (ref 0.61–1.24)
GFR, Estimated: 50 mL/min — ABNORMAL LOW (ref >60)
Glucose, Bld: 81 mg/dL (ref 70–99)
Potassium: 4.6 mmol/L (ref 3.5–5.1)
Sodium: 135 mmol/L (ref 135–145)
Total Bilirubin: 1.4 mg/dL — ABNORMAL HIGH (ref 0.0–1.2)
Total Protein: 7.1 g/dL (ref 6.5–8.1)

## 2023-10-24 LAB — TROPONIN I (HIGH SENSITIVITY)
Troponin I (High Sensitivity): 7 ng/L (ref <18)
Troponin I (High Sensitivity): 8 ng/L (ref <18)

## 2023-10-24 LAB — LACTIC ACID, PLASMA
Lactic Acid, Venous: 4.1 mmol/L (ref 0.5–1.9)
Lactic Acid, Venous: 3.2 mmol/L (ref 0.5–1.9)

## 2023-10-24 LAB — MAGNESIUM: Magnesium: 1.4 mg/dL — ABNORMAL LOW (ref 1.7–2.4)

## 2023-10-24 LAB — CK: Total CK: 201 U/L (ref 49–397)

## 2023-10-24 LAB — ETHANOL: Alcohol, Ethyl (B): <10 mg/dL (ref <10)

## 2023-10-24 LAB — ACETAMINOPHEN LEVEL: Acetaminophen (Tylenol), Serum: <10 ug/mL — ABNORMAL LOW (ref 10–30)

## 2023-10-24 MED ORDER — SODIUM CHLORIDE 0.9 % IV BOLUS
1000.0000 mL | Freq: Once | INTRAVENOUS | Status: AC
  Administered 2023-10-24: 1000 mL via INTRAVENOUS

## 2023-10-24 MED ORDER — LEVETIRACETAM IN NACL 1500 MG/100ML IV SOLN
1500.0000 mg | Freq: Once | INTRAVENOUS | Status: AC
  Administered 2023-10-24: 1500 mg via INTRAVENOUS
  Filled 2023-10-24: qty 100

## 2023-10-24 NOTE — H&P (Signed)
 History and Physical    Patient: Scott Jackson:[id number] DOB: 08/17/1950 DOA: 10/24/2023 DOS: the patient was seen and examined on 10/25/2023 PCP: Aliene Beams, MD  Patient coming from: Home  Chief Complaint:  Chief Complaint  Patient presents with   Altered Mental Status   HPI: Scott Jackson is a 73 y.o. male with medical history significant of  alcohol abuse, COPD, hypertension, GERD, history of seizures, alcoholic dementia, tobacco abuse, failure to thrive in adult who presents to the emergency department via EMS from home due to altered mental status. Patient's last well-known was around 1:30 to 2 PM, patient went to the store earlier with the family and returned home without any issues.  Family checked back on him around 1600-1630 and was noted to be on the floor.  He was incontinent and family was concerned he may have had a seizure, EMS was activated and arrival of EMS team, there was no evidence of trauma noted.  Patient was slightly confused and was not sure of why he presented to the emergency department.  Patient states that he has been compliant with his home meds and last alcohol intake was about 2 days (Sunday -3/30).  He endorsed drinking 2 shots of vodka a few times during the week and denies symptoms of alcohol withdrawal when he abstains from drinking.  ED Course:  In the emergency department, patient was tachypneic, BP was 124/96, other vital signs were within normal range.  Workup in the ED showed normocytic anemia and thrombocytopenia, lactic acid 4.1 > 3.2, magnesium 1.4, troponin x 2 was normal, total CK2 1, ammonia 23, urine drug screen was normal, urinalysis was positive for proteinuria.  Influenza A, B, SARS coronavirus 2, RSV was negative. CT head without contrast showed no acute intracranial abnormality Chest x-ray showed no acute disease Keppra 1500 mg x 1 was given, IV hydration was provided. Hospitalist was asked to admit patient for further evaluation and  management.  Review of Systems: Review of systems as noted in the HPI. All other systems reviewed and are negative.   Past Medical History:  Diagnosis Date   Alcoholic dementia (HCC)    Anemia    ETOH abuse    Gastritis and duodenitis    Hypertension    Pulmonary nodule    Seizures (HCC)    Tobacco abuse    Past Surgical History:  Procedure Laterality Date   ANKLE FRACTURE SURGERY     APPENDECTOMY     BIOPSY  01/08/2019   Procedure: BIOPSY;  Surgeon: West Bali, MD;  Location: [facility];  Service: Endoscopy;;  gastric   BIOPSY  11/22/2021   Procedure: BIOPSY;  Surgeon: Lanelle Bal, DO;  Location: [facility];  Service: Endoscopy;;   COLONOSCOPY  01/2015   Dr. Allena Katz: two 2-80mm rectal polyps removed, hyperplastic   COLONOSCOPY  2007   Dr. Allena Katz: three adenomas removed measuring 4, 10, 15mm. diverticulosis   COLONOSCOPY WITH PROPOFOL N/A 01/08/2019   Dr. Darrick Penna: Severe diverticulosis with fixed rectosigmoid region, rectal bleeding likely due to internal hemorrhoids.  Recommended repeat colonoscopy at [facility] because examination was incomplete due to fixed rectosigmoid colon (patient never followed through).   COLONOSCOPY WITH PROPOFOL N/A 11/22/2021   Surgeon: Earnest Bailey K, DO;  nonbleeding internal hemorrhoids, diverticulosis in the sigmoid, descending, transverse colon, 4 mm tubular adenoma in the descending colon removed.  Recommended 5-year surveillance.   ESOPHAGOGASTRODUODENOSCOPY (EGD) WITH PROPOFOL N/A 01/08/2019   Dr. Darrick Penna: Low-grade narrowing  Schatzki ring at the GE junction, medium sized hiatal hernia, erosive gastritis/duodenitis in the setting of NSAID and alcohol use.  Gastric biopsy showed gastropathy but no H. pylori   ESOPHAGOGASTRODUODENOSCOPY (EGD) WITH PROPOFOL N/A 11/22/2021   Surgeon: Lanelle Bal, DO;   2 cm hiatal hernia, mild Schatzki's ring, gastritis biopsied, duodenitis.  Pathology with mild chronic gastritis, negative for  H. pylori.   intestininal blockage  1970   POLYPECTOMY  11/22/2021   Procedure: POLYPECTOMY;  Surgeon: Lanelle Bal, DO;  Location: [facility];  Service: Endoscopy;;    Social History:  reports that he has been smoking cigarettes. He has a 12.5 pack-year smoking history. He has never used smokeless tobacco. He reports current alcohol use of about 4.0 standard drinks of alcohol per week. He reports that he does not use drugs.   No Known Allergies  Family History  Problem Relation Age of Onset   Hypertension Mother    Stroke Mother    Breast cancer Sister    Breast cancer Sister    Fibromyalgia Daughter    Lupus Daughter    Anxiety disorder Daughter    Post-traumatic stress disorder Daughter    Asthma Daughter    Colon cancer Neg Hx    Seizures Neg Hx      Prior to Admission medications   Medication Sig Start Date End Date Taking? Authorizing Provider  amLODipine (NORVASC) 10 MG tablet Take 1 tablet (10 mg total) by mouth daily. For BP 01/08/22  Yes Emokpae, Courage, MD  Cyanocobalamin (B-12) 1000 MCG CAPS Take 1 capsule by mouth daily. 05/17/23  Yes [provider]  folic acid (FOLVITE) 1 MG tablet Take 1 tablet (1 mg total) by mouth daily. 01/08/22  Yes Emokpae, Courage, MD  hydrALAZINE (APRESOLINE) 50 MG tablet Take 1 tablet (50 mg total) by mouth 3 (three) times daily. For BP and Heart 01/08/22  Yes Emokpae, Courage, MD  levETIRAcetam (KEPPRA) 1000 MG tablet Take 1 tablet (1,000 mg total) by mouth 2 (two) times daily. 06/19/23  Yes McCue, Shanda Bumps, NP  methocarbamol (ROBAXIN) 750 MG tablet Take 750 mg by mouth every 8 (eight) hours as needed. 08/11/23  Yes [provider]  tamsulosin (FLOMAX) 0.4 MG CAPS capsule Take 1 capsule (0.4 mg total) by mouth daily after supper. 01/08/22  Yes Emokpae, Courage, MD  thiamine 100 MG tablet Take 1 tablet (100 mg total) by mouth daily. 01/08/22  Yes Shon Hale, MD    Physical Exam: BP 134/87   Pulse 74   Temp  98.2 F (36.8 C) (Oral)   Resp 18   Ht 6\' 1"  (1.854 m)   SpO2 98%   BMI 18.07 kg/m   General: 73 y.o. year-old male chronically ill-appearing, but in no acute distress.  Alert and oriented x3. HEENT: NCAT, EOMI Neck: Supple, trachea medial Cardiovascular: Regular rate and rhythm with no rubs or gallops.  No thyromegaly or JVD noted.  No lower extremity edema. 2/4 pulses in all 4 extremities. Respiratory: Clear to auscultation with no wheezes or rales. Good inspiratory effort. Abdomen: Soft, nontender nondistended with normal bowel sounds x4 quadrants. Muskuloskeletal: No cyanosis, clubbing or edema noted bilaterally Neuro: CN II-XII intact, strength 5/5 x 4, sensation, reflexes intact Skin: No ulcerative lesions noted or rashes Psychiatry: Judgement and insight appear normal. Mood is appropriate for condition and setting          Labs on Admission:  Basic Metabolic Panel: Recent Labs  Lab 10/24/23 1900  NA  135  K 4.6  CL 94*  CO2 19*  GLUCOSE 81  BUN 16  CREATININE 1.48*  CALCIUM 9.3  MG 1.4*   Liver Function Tests: Recent Labs  Lab 10/24/23 1900  AST 56*  ALT 23  ALKPHOS 47  BILITOT 1.4*  PROT 7.1  ALBUMIN 3.7   No results for input(s): "LIPASE", "AMYLASE" in the last 168 hours. Recent Labs  Lab 10/24/23 1900  AMMONIA 23   CBC: Recent Labs  Lab 10/24/23 1810  WBC 6.4  NEUTROABS 4.6  HGB 12.6*  HCT 38.2*  MCV 97.0  PLT 105*   Cardiac Enzymes: Recent Labs  Lab 10/24/23 1900  CKTOTAL 201    BNP (last 3 results) No results for input(s): "BNP" in the last 8760 hours.  ProBNP (last 3 results) No results for input(s): "PROBNP" in the last 8760 hours.  CBG: No results for input(s): "GLUCAP" in the last 168 hours.  Radiological Exams on Admission: CT Head Wo Contrast Result Date: 10/24/2023 CLINICAL DATA:  Delirium. Potential fall. Altered mental status. History of alcoholism. EXAM: CT HEAD WITHOUT CONTRAST TECHNIQUE: Contiguous axial images  were obtained from the base of the skull through the vertex without intravenous contrast. RADIATION DOSE REDUCTION: This exam was performed according to the departmental dose-optimization program which includes automated exposure control, adjustment of the mA and/or kV according to patient size and/or use of iterative reconstruction technique. COMPARISON:  01/06/2022 FINDINGS: Brain: No intracranial hemorrhage, mass effect, or evidence of acute infarct. No hydrocephalus. No extra-axial fluid collection. Age advanced cerebral atrophy and chronic small vessel ischemic disease. Vascular: No hyperdense vessel. Intracranial arterial calcification. Skull: No fracture or focal lesion. Sinuses/Orbits: No acute finding. Other: None. IMPRESSION: 1. No acute intracranial abnormality. 2. Age advanced cerebral atrophy. Electronically Signed   By: Minerva Fester M.D.   On: 10/24/2023 19:42   DG Chest Portable 1 View Result Date: 10/24/2023 CLINICAL DATA:  Altered mental status EXAM: PORTABLE CHEST 1 VIEW COMPARISON:  Chest radiograph dated 01/06/2022 FINDINGS: Normal lung volumes. No focal consolidations. No pleural effusion or pneumothorax. The heart size and mediastinal contours are within normal limits. Old right posterior rib fractures. IMPRESSION: No acute disease. Electronically Signed   By: Agustin Cree M.D.   On: 10/24/2023 18:24    EKG: I independently viewed the EKG done and my findings are as followed: Normal sinus rhythm at a rate of 92 bpm  Assessment/Plan Present on Admission:  Starvation ketoacidosis  Acute metabolic encephalopathy  AKI (acute kidney injury) (HCC)  Essential hypertension  COPD (chronic obstructive pulmonary disease) (HCC)  Tobacco abuse  Principal Problem:   Starvation ketoacidosis Active Problems:   Essential hypertension   COPD (chronic obstructive pulmonary disease) (HCC)   Tobacco abuse   AKI (acute kidney injury) (HCC)   Acute metabolic encephalopathy   Lactic acidosis    Hypomagnesemia   Breakthrough seizure (HCC)  Starvation ketoacidosis Initial VBG showed pH of 7.15, VBG s/p fluid resuscitation was 7.37 Continue IV D5 LR  Lactic acidosis secondary to above Lactic acid 4.1 > 3.2, continue IV hydration Continue to trend lactic acid  Hypomagnesemia Magnesium 1.4, this was possibly due to beer potomania This will be replenished  Breakthrough seizures Patient was reported to have breakthrough seizures at home Keppra was given in the ED Continue Keppra per home regimen Keppra level will be checked EEG will be checked in the morning Consider neurology consult based on findings  Acute metabolic encephalopathy-resolved This was possibly due to postictal state  s/p breakthrough seizures  Acute kidney injury Creatinine 1.48 (baseline creatinine at 0.9-1.2) Renally adjust medications, avoid nephrotoxic agents/dehydration/hypotension  Essential hypertension Continue hydralazine, amlodipine  COPD/Tobacco Abuse  Patient was advised on tobacco abuse cessation  Continue albuterol inhaler as needed  DVT prophylaxis: Subcu aspirin  Code Status: Full code  Family Communication: None at bedside  Consults: None  Severity of Illness: The appropriate patient status for this patient is OBSERVATION. Observation status is judged to be reasonable and necessary in order to provide the required intensity of service to ensure the patient's safety. The patient's presenting symptoms, physical exam findings, and initial radiographic and laboratory data in the context of their medical condition is felt to place them at decreased risk for further clinical deterioration. Furthermore, it is anticipated that the patient will be medically stable for discharge from the hospital within 2 midnights of admission.   Author: Frankey Shown, DO 10/25/2023 2:30 AM  For on call review [email or web address].

## 2023-10-24 NOTE — ED Provider Notes (Signed)
 [place] [facility] AT [facility] Provider Note  CSN: [id number] Arrival date & time: 10/24/23 1720  Chief Complaint(s) Altered Mental Status  HPI Scott Jackson is a 73 y.o. male with past medical history as below, significant for chronic alcohol abuse, gastritis, duodenitis, hypertension, alcoholic dementia, tobacco abuse, malnutrition who presents to the ED with complaint of AMS  Patient lives alone, last known normal was around 1:30-2pm, he went with family to store and returned home w/o difficulty. They went to check on him around 4:00-430 and he was on the floor, concern he was having  a seizure, was incontinent, no vomiting, no evidence of obvious trauma in the residence per ems. Pt [place] unclear about the events leading up to his presentation today, he reports good compliance with his home meds including keppra, no recent etoh. He has no pain, no n/v, he has poor appetite, reduced uop. No fevers/chills/sick contacts. Family at bedside w/ report of seizure activity, believe he has been taking his medications but not sure.   Past Medical History Past Medical History:  Diagnosis Date   Alcoholic dementia (HCC)    Anemia    ETOH abuse    Gastritis and duodenitis    Hypertension    Pulmonary nodule    Seizures (HCC)    Tobacco abuse    Patient Active Problem List   Diagnosis Date Noted   Starvation ketoacidosis 10/24/2023   Hypothermia 01/07/2022   Hypotension 01/07/2022   Hypokalemia 01/07/2022   Dehydration 01/07/2022   Hypoalbuminemia due to protein-calorie malnutrition (HCC) 01/07/2022   Noncompliance with medication regimen 01/07/2022   GERD (gastroesophageal reflux disease) 01/07/2022   AKI (acute kidney injury) (HCC) 10/12/2021   Acute metabolic encephalopathy 10/12/2021   Electrolyte abnormality 10/12/2021   Prolonged QT interval 10/12/2021   Encephalopathy chronic 09/28/2021   Nausea and vomiting 05/20/2021   Increased ammonia level 05/20/2021    History of gastritis 05/20/2021   Syncope 01/09/2021   Tobacco abuse 01/09/2021   Alcohol dependence with other alcohol-induced disorder (HCC) 05/07/2020   Seizure disorder (HCC) 05/07/2020   Anorexia nervosa 10/16/2019   Loss of weight 05/13/2019   Abnormal CT scan, colon 05/13/2019   Single seizure (HCC) 04/09/2019   Adult failure to thrive 04/09/2019   Alcoholism with alcohol dependence (HCC) 03/19/2019   Alcoholism associated with dementia (HCC) 03/19/2019   COPD/Emphysema of lung (HCC) 03/19/2019   Abdominal pain, epigastric    Hemorrhoid 09/28/2018   Rectal bleeding 09/28/2018   Constipation 09/28/2018   Screen for sexually transmitted diseases 11/24/2017   Hyperlipidemia 11/24/2017   Essential hypertension 11/24/2017   Screening for viral disease 11/24/2017   Alcohol abuse 11/24/2017   Alcohol abuse counseling and surveillance of alcoholic 11/24/2017   Home Medication(s) Prior to Admission medications   Medication Sig Start Date End Date Taking? Authorizing Provider  amLODipine (NORVASC) 10 MG tablet Take 1 tablet (10 mg total) by mouth daily. For BP 01/08/22  Yes Emokpae, Courage, MD  Cyanocobalamin (B-12) 1000 MCG CAPS Take 1 capsule by mouth daily. 05/17/23  Yes [provider]  folic acid (FOLVITE) 1 MG tablet Take 1 tablet (1 mg total) by mouth daily. 01/08/22  Yes Emokpae, Courage, MD  hydrALAZINE (APRESOLINE) 50 MG tablet Take 1 tablet (50 mg total) by mouth 3 (three) times daily. For BP and Heart 01/08/22  Yes Emokpae, Courage, MD  levETIRAcetam (KEPPRA) 1000 MG tablet Take 1 tablet (1,000 mg total) by mouth 2 (two) times daily. 06/19/23  Yes  Ihor Austin, NP  methocarbamol (ROBAXIN) 750 MG tablet Take 750 mg by mouth every 8 (eight) hours as needed. 08/11/23  Yes [provider]  tamsulosin (FLOMAX) 0.4 MG CAPS capsule Take 1 capsule (0.4 mg total) by mouth daily after supper. 01/08/22  Yes Emokpae, Courage, MD  thiamine 100 MG tablet Take 1 tablet  (100 mg total) by mouth daily. 01/08/22  Yes Shon Hale, MD                                                                                                                                    Past Surgical History Past Surgical History:  Procedure Laterality Date   ANKLE FRACTURE SURGERY     APPENDECTOMY     BIOPSY  01/08/2019   Procedure: BIOPSY;  Surgeon: West Bali, MD;  Location: [facility];  Service: Endoscopy;;  gastric   BIOPSY  11/22/2021   Procedure: BIOPSY;  Surgeon: Lanelle Bal, DO;  Location: [facility];  Service: Endoscopy;;   COLONOSCOPY  01/2015   Dr. Allena Katz: two 2-67mm rectal polyps removed, hyperplastic   COLONOSCOPY  2007   Dr. Allena Katz: three adenomas removed measuring 4, 10, 15mm. diverticulosis   COLONOSCOPY WITH PROPOFOL N/A 01/08/2019   Dr. Darrick Penna: Severe diverticulosis with fixed rectosigmoid region, rectal bleeding likely due to internal hemorrhoids.  Recommended repeat colonoscopy at [facility] because examination was incomplete due to fixed rectosigmoid colon (patient never followed through).   COLONOSCOPY WITH PROPOFOL N/A 11/22/2021   Surgeon: Earnest Bailey K, DO;  nonbleeding internal hemorrhoids, diverticulosis in the sigmoid, descending, transverse colon, 4 mm tubular adenoma in the descending colon removed.  Recommended 5-year surveillance.   ESOPHAGOGASTRODUODENOSCOPY (EGD) WITH PROPOFOL N/A 01/08/2019   Dr. Darrick Penna: Low-grade narrowing Schatzki ring at the GE junction, medium sized hiatal hernia, erosive gastritis/duodenitis in the setting of NSAID and alcohol use.  Gastric biopsy showed gastropathy but no H. pylori   ESOPHAGOGASTRODUODENOSCOPY (EGD) WITH PROPOFOL N/A 11/22/2021   Surgeon: Lanelle Bal, DO;   2 cm hiatal hernia, mild Schatzki's ring, gastritis biopsied, duodenitis.  Pathology with mild chronic gastritis, negative for H. pylori.   intestininal blockage  1970   POLYPECTOMY  11/22/2021   Procedure: POLYPECTOMY;  Surgeon:  Lanelle Bal, DO;  Location: [facility];  Service: Endoscopy;;   Family History Family History  Problem Relation Age of Onset   Hypertension Mother    Stroke Mother    Breast cancer Sister    Breast cancer Sister    Fibromyalgia Daughter    Lupus Daughter    Anxiety disorder Daughter    Post-traumatic stress disorder Daughter    Asthma Daughter    Colon cancer Neg Hx    Seizures Neg Hx     Social History Social History   Tobacco Use   Smoking status: Every Day    Current packs/day: 0.25    Average packs/day: 0.3  packs/day for 50.0 years (12.5 ttl pk-yrs)    Types: Cigarettes   Smokeless tobacco: Never  Vaping Use   Vaping status: Never Used  Substance Use Topics   Alcohol use: Yes    Alcohol/week: 4.0 standard drinks of alcohol    Types: 4 Shots of liquor per week   Drug use: No   Allergies Patient has no known allergies.  Review of Systems A thorough review of systems was obtained and all systems are negative except as noted in the HPI and PMH.   Physical Exam Vital Signs  I have reviewed the triage vital signs BP (!) 144/93   Pulse 79   Temp 99.1 F (37.3 C) (Oral)   Resp (!) 21   Ht 6\' 1"  (1.854 m)   SpO2 97%   BMI 18.07 kg/m  Physical Exam Vitals and nursing note reviewed.  Constitutional:      General: He is not in acute distress.    Appearance: He is well-developed.  HENT:     Head: Normocephalic and atraumatic.     Right Ear: External ear normal.     Left Ear: External ear normal.     Mouth/Throat:     Mouth: Mucous membranes are moist.     Comments: Anterior tongue laceration <1cm Eyes:     General: No scleral icterus.    Extraocular Movements: Extraocular movements intact.     Pupils: Pupils are equal, round, and reactive to light.  Cardiovascular:     Rate and Rhythm: Normal rate and regular rhythm.     Pulses: Normal pulses.     Heart sounds: Normal heart sounds.  Pulmonary:     Effort: Pulmonary effort is normal. No  respiratory distress.     Breath sounds: Normal breath sounds.  Abdominal:     General: Abdomen is flat.     Palpations: Abdomen is soft.     Tenderness: There is no abdominal tenderness.  Musculoskeletal:     Cervical back: No rigidity.     Right lower leg: No edema.     Left lower leg: No edema.  Skin:    General: Skin is warm and dry.     Capillary Refill: Capillary refill takes less than 2 seconds.  Neurological:     Mental Status: He is alert and oriented to person, place, and time.     GCS: GCS eye subscore is 4. GCS verbal subscore is 5. GCS motor subscore is 6.     Cranial Nerves: Cranial nerves 2-12 are intact. No dysarthria or facial asymmetry.     Sensory: Sensation is intact. No sensory deficit.     Motor: Motor function is intact. No weakness, tremor or pronator drift.     Coordination: Coordination is intact. Finger-Nose-Finger Test normal.     Comments: Gait testing deferred secondary to patient safety.  Strength 5/5 to BLUE/BLLE, equal and symmetric    Psychiatric:        Mood and Affect: Mood normal.        Behavior: Behavior normal.     ED Results and Treatments Labs (all labs ordered are listed, but only abnormal results are displayed) Labs Reviewed  CBC WITH DIFFERENTIAL/PLATELET - Abnormal; Notable for the following components:      Result Value   RBC 3.94 (*)    Hemoglobin 12.6 (*)    HCT 38.2 (*)    RDW 16.6 (*)    Platelets 105 (*)    All other components within normal limits  BLOOD GAS, VENOUS - Abnormal; Notable for the following components:   pH, Ven 7.15 (*)    pO2, Ven <31 (*)    Bicarbonate 19.2 (*)    Acid-base deficit 10.0 (*)    All other components within normal limits  SALICYLATE LEVEL - Abnormal; Notable for the following components:   Salicylate Lvl <7.0 (*)    All other components within normal limits  ACETAMINOPHEN LEVEL - Abnormal; Notable for the following components:   Acetaminophen (Tylenol), Serum <10 (*)    All other  components within normal limits  LACTIC ACID, PLASMA - Abnormal; Notable for the following components:   Lactic Acid, Venous 4.1 (*)    All other components within normal limits  LACTIC ACID, PLASMA - Abnormal; Notable for the following components:   Lactic Acid, Venous 3.2 (*)    All other components within normal limits  COMPREHENSIVE METABOLIC PANEL WITH GFR - Abnormal; Notable for the following components:   Chloride 94 (*)    CO2 19 (*)    Creatinine, Ser 1.48 (*)    AST 56 (*)    Total Bilirubin 1.4 (*)    GFR, Estimated 50 (*)    Anion gap 22 (*)    All other components within normal limits  MAGNESIUM - Abnormal; Notable for the following components:   Magnesium 1.4 (*)    All other components within normal limits  BLOOD GAS, VENOUS - Abnormal; Notable for the following components:   pCO2, Ven 39 (*)    pO2, Ven 31 (*)    All other components within normal limits  URINALYSIS, ROUTINE W REFLEX MICROSCOPIC - Abnormal; Notable for the following components:   Color, Urine AMBER (*)    APPearance HAZY (*)    Bilirubin Urine SMALL (*)    Ketones, ur 20 (*)    Protein, ur 100 (*)    All other components within normal limits  RESP PANEL BY RT-PCR (RSV, FLU A&B, COVID)  RVPGX2  ETHANOL  RAPID URINE DRUG SCREEN, HOSP PERFORMED  CK  AMMONIA  LEVETIRACETAM LEVEL  TROPONIN I (HIGH SENSITIVITY)  TROPONIN I (HIGH SENSITIVITY)                                                                                                                          Radiology CT Head Wo Contrast Result Date: 10/24/2023 CLINICAL DATA:  Delirium. Potential fall. Altered mental status. History of alcoholism. EXAM: CT HEAD WITHOUT CONTRAST TECHNIQUE: Contiguous axial images were obtained from the base of the skull through the vertex without intravenous contrast. RADIATION DOSE REDUCTION: This exam was performed according to the departmental dose-optimization program which includes automated exposure control,  adjustment of the mA and/or kV according to patient size and/or use of iterative reconstruction technique. COMPARISON:  01/06/2022 FINDINGS: Brain: No intracranial hemorrhage, mass effect, or evidence of acute infarct. No hydrocephalus. No extra-axial fluid collection. Age advanced cerebral atrophy and chronic small vessel ischemic disease. Vascular: No hyperdense vessel.  Intracranial arterial calcification. Skull: No fracture or focal lesion. Sinuses/Orbits: No acute finding. Other: None. IMPRESSION: 1. No acute intracranial abnormality. 2. Age advanced cerebral atrophy. Electronically Signed   By: Minerva Fester M.D.   On: 10/24/2023 19:42   DG Chest Portable 1 View Result Date: 10/24/2023 CLINICAL DATA:  Altered mental status EXAM: PORTABLE CHEST 1 VIEW COMPARISON:  Chest radiograph dated 01/06/2022 FINDINGS: Normal lung volumes. No focal consolidations. No pleural effusion or pneumothorax. The heart size and mediastinal contours are within normal limits. Old right posterior rib fractures. IMPRESSION: No acute disease. Electronically Signed   By: Agustin Cree M.D.   On: 10/24/2023 18:24    Pertinent labs & imaging results that were available during my care of the patient were reviewed by me and considered in my medical decision making (see MDM for details).  Medications Ordered in ED Medications  levETIRAcetam (KEPPRA) IVPB 1500 mg/ 100 mL premix (has no administration in time range)  sodium chloride 0.9 % bolus 1,000 mL (0 mLs Intravenous Stopped 10/24/23 2016)  sodium chloride 0.9 % bolus 1,000 mL (0 mLs Intravenous Stopped 10/24/23 2048)                                                                                                                                     Procedures .Critical Care  Performed by: Sloan Leiter, DO Authorized by: Sloan Leiter, DO   Critical care provider statement:    Critical care time (minutes):  50   Critical care time was exclusive of:  Separately billable  procedures and treating other patients   Critical care was necessary to treat or prevent imminent or life-threatening deterioration of the following conditions:  Metabolic crisis and dehydration   Critical care was time spent personally by me on the following activities:  Development of treatment plan with patient or surrogate, discussions with consultants, evaluation of patient's response to treatment, examination of patient, ordering and review of laboratory studies, ordering and review of radiographic studies, ordering and performing treatments and interventions, pulse oximetry, re-evaluation of patient's condition, review of old charts and obtaining history from patient or surrogate   Care discussed with: admitting provider     (including critical care time)  Medical Decision Making / ED Course    Medical Decision Making:    Kenshin Splawn is a 73 y.o. male  with past medical history as below, significant for chronic alcohol abuse, gastritis, duodenitis, hypertension, alcoholic dementia, tobacco abuse, malnutrition who presents to the ED with complaint of AMS. The complaint involves an extensive differential diagnosis and also carries with it a high risk of complications and morbidity.  Serious etiology was considered. Ddx includes but is not limited to: Differential diagnoses for altered mental status includes but is not exclusive to alcohol, illicit or prescription medications, intracranial pathology such as stroke, intracerebral hemorrhage, fever or infectious causes including sepsis, hypoxemia, uremia, trauma, endocrine related  disorders such as diabetes, hypoglycemia, thyroid-related diseases, etc.   Complete initial physical exam performed, notably the patient was in no distress, aox3.    Reviewed and confirmed nursing documentation for past medical history, family history, social history.  Vital signs reviewed.      Clinical Course as of 10/24/23 2327  Tue Oct 24, 2023  1759 Scott Jackson Daughter Emergency Contact [phone]  Left vm [SG]  1830 CXR non-acute [SG]  1959 pH, Ven(!!): 7.15 Pt refused abg, will rpt vbg  [SG]  2022 Creatinine(!): 1.48 Baseline around 1.0, give further fluids [SG]  2023 Lactic Acid, Venous(!!): 4.1 Possible seizure PTA reported by family [SG]  2104 pH, Ven: 7.37 pH normalized  [SG]  2227 Ketones, ur(!): 20 [SG]  2228 Lactic Acid, Venous(!!): 3.2 Concern for starvation ketosis [SG]    Clinical Course User Index [SG] Sloan Leiter, DO    Brief summary: 73 year old male history of dementia, chronic alcohol abuse, malnutrition here with AMS, possible seizure Neuro nonfocal, he is disoriented to time.  History underlying dementia. VBG initially quite acidotic, has since improved on recheck with fluids.  Urinalysis with ketones, he has poor p.o. intake, history of malnutrition.  Lactic acid also initially elevated but has since improved after fluids.  Mental status is improving. Recommend admission for AKI, starvation ketosis.  Breakthrough seizure, loaded w/ keppra. No seizure activity in the ED. Patient is agreeable.  Similar presentation 01/06/22  Accepted TRH              Additional history obtained: -Additional history obtained from family -External records from outside source obtained and reviewed including: Chart review including previous notes, labs, imaging, consultation notes including  Prior ed visit, prior admission, home meds/prior labs   Lab Tests: -I ordered, reviewed, and interpreted labs.   The pertinent results include:   Labs Reviewed  CBC WITH DIFFERENTIAL/PLATELET - Abnormal; Notable for the following components:      Result Value   RBC 3.94 (*)    Hemoglobin 12.6 (*)    HCT 38.2 (*)    RDW 16.6 (*)    Platelets 105 (*)    All other components within normal limits  BLOOD GAS, VENOUS - Abnormal; Notable for the following components:   pH, Ven 7.15 (*)    pO2, Ven <31 (*)     Bicarbonate 19.2 (*)    Acid-base deficit 10.0 (*)    All other components within normal limits  SALICYLATE LEVEL - Abnormal; Notable for the following components:   Salicylate Lvl <7.0 (*)    All other components within normal limits  ACETAMINOPHEN LEVEL - Abnormal; Notable for the following components:   Acetaminophen (Tylenol), Serum <10 (*)    All other components within normal limits  LACTIC ACID, PLASMA - Abnormal; Notable for the following components:   Lactic Acid, Venous 4.1 (*)    All other components within normal limits  LACTIC ACID, PLASMA - Abnormal; Notable for the following components:   Lactic Acid, Venous 3.2 (*)    All other components within normal limits  COMPREHENSIVE METABOLIC PANEL WITH GFR - Abnormal; Notable for the following components:   Chloride 94 (*)    CO2 19 (*)    Creatinine, Ser 1.48 (*)    AST 56 (*)    Total Bilirubin 1.4 (*)    GFR, Estimated 50 (*)    Anion gap 22 (*)    All other components within normal limits  MAGNESIUM - Abnormal; Notable for  the following components:   Magnesium 1.4 (*)    All other components within normal limits  BLOOD GAS, VENOUS - Abnormal; Notable for the following components:   pCO2, Ven 39 (*)    pO2, Ven 31 (*)    All other components within normal limits  URINALYSIS, ROUTINE W REFLEX MICROSCOPIC - Abnormal; Notable for the following components:   Color, Urine AMBER (*)    APPearance HAZY (*)    Bilirubin Urine SMALL (*)    Ketones, ur 20 (*)    Protein, ur 100 (*)    All other components within normal limits  RESP PANEL BY RT-PCR (RSV, FLU A&B, COVID)  RVPGX2  ETHANOL  RAPID URINE DRUG SCREEN, HOSP PERFORMED  CK  AMMONIA  LEVETIRACETAM LEVEL  TROPONIN I (HIGH SENSITIVITY)  TROPONIN I (HIGH SENSITIVITY)    Notable for as above  EKG   EKG Interpretation Date/Time:    Ventricular Rate:    PR Interval:    QRS Duration:    QT Interval:    QTC Calculation:   R Axis:      Text Interpretation:            Imaging Studies ordered: I ordered imaging studies including CTH CXR I independently visualized the following imaging with scope of interpretation limited to determining acute life threatening conditions related to emergency care; findings noted above I independently visualized and interpreted imaging. I agree with the radiologist interpretation   Medicines ordered and prescription drug management: Meds ordered this encounter  Medications   sodium chloride 0.9 % bolus 1,000 mL   sodium chloride 0.9 % bolus 1,000 mL   levETIRAcetam (KEPPRA) IVPB 1500 mg/ 100 mL premix    -I have reviewed the patients home medicines and have made adjustments as needed   Consultations Obtained: I requested consultation with the trh,  and discussed lab and imaging findings as well as pertinent plan    Cardiac Monitoring: The patient was maintained on a cardiac monitor.  I personally viewed and interpreted the cardiac monitored which showed an underlying rhythm of: nsr Continuous pulse oximetry interpreted by myself, 98% on RA.    Social Determinants of Health:  Diagnosis or treatment significantly limited by social determinants of health: current smoker Counseled patient for approximately 3 minutes regarding smoking cessation. Discussed risks of smoking and how they applied and affected their visit here today. Patient not ready to quit at this time, however will follow up with their primary doctor when they are.   CPT code: 47829: intermediate counseling for smoking cessation     Reevaluation: After the interventions noted above, I reevaluated the patient and found that they have improved  Co morbidities that complicate the patient evaluation  Past Medical History:  Diagnosis Date   Alcoholic dementia (HCC)    Anemia    ETOH abuse    Gastritis and duodenitis    Hypertension    Pulmonary nodule    Seizures (HCC)    Tobacco abuse       Dispostion: Disposition decision  including need for hospitalization was considered, and patient admitted to the hospital.    Final Clinical Impression(s) / ED Diagnoses Final diagnoses:  Altered mental status, unspecified altered mental status type  AKI (acute kidney injury) (HCC)  Starvation ketoacidosis        Sloan Leiter, DO 10/24/23 2328

## 2023-10-24 NOTE — Progress Notes (Signed)
 Pt and family refused ABG at this time. MD notified

## 2023-10-24 NOTE — ED Triage Notes (Signed)
 Pt bib rcems after a potential fall and ams. Pt has a hx of alcoholism (strait vodka). Daughter reports he was complaining of knee pain yesterday stating he was unable to bare weight on his right leg. Daughter states pt is noncompliant with medications and does not eat very much. Pt states his last drink was last week. Daughter states it was probably a day or 2 at most. She had not heard from him in 2 days and that is abnormal.

## 2023-10-24 NOTE — ED Notes (Signed)
Pt attempting to get urine sample. 

## 2023-10-24 NOTE — ED Notes (Signed)
 Pt was bladder scanned by this tech and another tech, 5ml. This was attempted multiple times with 5ml being the most volume seen on the bladder scanner

## 2023-10-25 ENCOUNTER — Observation Stay (HOSPITAL_COMMUNITY)
Admit: 2023-10-25 | Discharge: 2023-10-25 | Disposition: A | Discharge status: Home / Self Care | Attending: Internal Medicine

## 2023-10-25 DIAGNOSIS — R569 Unspecified convulsions: Secondary | ICD-10-CM | POA: Diagnosis not present

## 2023-10-25 DIAGNOSIS — G40919 Epilepsy, unspecified, intractable, without status epilepticus: Secondary | ICD-10-CM | POA: Insufficient documentation

## 2023-10-25 DIAGNOSIS — R4182 Altered mental status, unspecified: Secondary | ICD-10-CM | POA: Diagnosis not present

## 2023-10-25 DIAGNOSIS — E8729 Other acidosis: Secondary | ICD-10-CM | POA: Diagnosis not present

## 2023-10-25 DIAGNOSIS — T730XXA Starvation, initial encounter: Secondary | ICD-10-CM | POA: Diagnosis not present

## 2023-10-25 DIAGNOSIS — E872 Acidosis, unspecified: Secondary | ICD-10-CM | POA: Insufficient documentation

## 2023-10-25 LAB — COMPREHENSIVE METABOLIC PANEL WITH GFR
ALT: 19 U/L (ref 0–44)
AST: 39 U/L (ref 15–41)
Albumin: 3.2 g/dL — ABNORMAL LOW (ref 3.5–5.0)
Alkaline Phosphatase: 39 U/L (ref 38–126)
Anion gap: 15 (ref 5–15)
BUN: 17 mg/dL (ref 8–23)
CO2: 22 mmol/L (ref 22–32)
Calcium: 8.6 mg/dL — ABNORMAL LOW (ref 8.9–10.3)
Chloride: 95 mmol/L — ABNORMAL LOW (ref 98–111)
Creatinine, Ser: 1.18 mg/dL (ref 0.61–1.24)
GFR, Estimated: >60 mL/min (ref >60)
Glucose, Bld: 77 mg/dL (ref 70–99)
Potassium: 4.4 mmol/L (ref 3.5–5.1)
Sodium: 132 mmol/L — ABNORMAL LOW (ref 135–145)
Total Bilirubin: 1.3 mg/dL — ABNORMAL HIGH (ref 0.0–1.2)
Total Protein: 6.6 g/dL (ref 6.5–8.1)

## 2023-10-25 LAB — CBC
HCT: 33.7 % — ABNORMAL LOW (ref 39.0–52.0)
Hemoglobin: 11.2 g/dL — ABNORMAL LOW (ref 13.0–17.0)
MCH: 32.4 pg (ref 26.0–34.0)
MCHC: 33.2 g/dL (ref 30.0–36.0)
MCV: 97.4 fL (ref 80.0–100.0)
Platelets: 172 10*3/uL (ref 150–400)
RBC: 3.46 MIL/uL — ABNORMAL LOW (ref 4.22–5.81)
RDW: 16.4 % — ABNORMAL HIGH (ref 11.5–15.5)
WBC: 5.4 10*3/uL (ref 4.0–10.5)
nRBC: 0 % (ref 0.0–0.2)

## 2023-10-25 LAB — LACTIC ACID, PLASMA: Lactic Acid, Venous: 1.3 mmol/L (ref 0.5–1.9)

## 2023-10-25 LAB — PHOSPHORUS: Phosphorus: 3.7 mg/dL (ref 2.5–4.6)

## 2023-10-25 LAB — MAGNESIUM: Magnesium: 1.6 mg/dL — ABNORMAL LOW (ref 1.7–2.4)

## 2023-10-25 MED ORDER — MAGNESIUM SULFATE 2 GM/50ML IV SOLN
2.0000 g | Freq: Once | INTRAVENOUS | Status: AC
  Administered 2023-10-25: 2 g via INTRAVENOUS
  Filled 2023-10-25: qty 50

## 2023-10-25 MED ORDER — ACETAMINOPHEN 650 MG RE SUPP: 650.0000 mg | Freq: Four times a day (QID) | RECTAL | Status: DC | PRN

## 2023-10-25 MED ORDER — HYDRALAZINE HCL 50 MG PO TABS: 50.0000 mg | ORAL_TABLET | Freq: Three times a day (TID) | ORAL | 11 refills | Status: DC

## 2023-10-25 MED ORDER — ALBUTEROL SULFATE (2.5 MG/3ML) 0.083% IN NEBU: 2.5000 mg | INHALATION_SOLUTION | Freq: Four times a day (QID) | RESPIRATORY_TRACT | Status: DC | PRN

## 2023-10-25 MED ORDER — AMLODIPINE BESYLATE 10 MG PO TABS: 10.0000 mg | ORAL_TABLET | Freq: Every day | ORAL | 3 refills | Status: AC

## 2023-10-25 MED ORDER — ONDANSETRON HCL 4 MG PO TABS: 4.0000 mg | ORAL_TABLET | Freq: Four times a day (QID) | ORAL | Status: DC | PRN

## 2023-10-25 MED ORDER — ONDANSETRON HCL 4 MG/2ML IJ SOLN: 4.0000 mg | Freq: Four times a day (QID) | INTRAMUSCULAR | Status: DC | PRN

## 2023-10-25 MED ORDER — TAMSULOSIN HCL 0.4 MG PO CAPS: 0.4000 mg | ORAL_CAPSULE | Freq: Every day | ORAL | 11 refills | Status: AC

## 2023-10-25 MED ORDER — ALBUTEROL SULFATE HFA 108 (90 BASE) MCG/ACT IN AERS: 2.0000 | INHALATION_SPRAY | Freq: Four times a day (QID) | RESPIRATORY_TRACT | Status: DC | PRN

## 2023-10-25 MED ORDER — MIRTAZAPINE 15 MG PO TABS
15.0000 mg | ORAL_TABLET | Freq: Every day | ORAL | 2 refills | Status: AC
Start: 2023-10-25 — End: 2024-10-24

## 2023-10-25 MED ORDER — HEPARIN SODIUM (PORCINE) 5000 UNIT/ML IJ SOLN
5000.0000 [IU] | Freq: Three times a day (TID) | INTRAMUSCULAR | Status: DC
  Administered 2023-10-25 (×2): 5000 [IU] via SUBCUTANEOUS
  Filled 2023-10-25 (×2): qty 1

## 2023-10-25 MED ORDER — ACETAMINOPHEN 325 MG PO TABS: 650.0000 mg | ORAL_TABLET | Freq: Four times a day (QID) | ORAL | Status: DC | PRN

## 2023-10-25 MED ORDER — DEXTROSE IN LACTATED RINGERS 5 % IV SOLN: INTRAVENOUS | Status: AC

## 2023-10-25 MED ORDER — LEVETIRACETAM 1000 MG PO TABS: 1000.0000 mg | ORAL_TABLET | Freq: Two times a day (BID) | ORAL | 3 refills | Status: DC

## 2023-10-25 MED ORDER — LEVETIRACETAM 500 MG PO TABS
1000.0000 mg | ORAL_TABLET | Freq: Two times a day (BID) | ORAL | Status: DC
  Administered 2023-10-25: 1000 mg via ORAL
  Filled 2023-10-25: qty 2

## 2023-10-25 NOTE — Plan of Care (Signed)
  Problem: Education: Goal: Knowledge of General Education information will improve Description: Including pain rating scale, medication(s)/side effects and non-pharmacologic comfort measures Outcome: Adequate for Discharge   Problem: Health Behavior/Discharge Planning: Goal: Ability to manage health-related needs will improve Outcome: Progressing   Problem: Clinical Measurements: Goal: Ability to maintain clinical measurements within normal limits will improve Outcome: Progressing Goal: Diagnostic test results will improve Outcome: Progressing Goal: Cardiovascular complication will be avoided Outcome: Progressing

## 2023-10-25 NOTE — Care Management Obs Status (Signed)
 MEDICARE OBSERVATION STATUS NOTIFICATION   Patient Details  Name: Nicasio Barlowe MRN: [id number] Date of Birth: 27-Jun-1951   Medicare Observation Status Notification Given:  Yes    Corey Harold 10/25/2023, 5:35 PM

## 2023-10-25 NOTE — Discharge Summary (Signed)
 Physician Discharge Summary   Patient: Scott Jackson MRN: [id number] DOB: 12-May-1951  Admit date:     10/24/2023  Discharge date: 10/25/23  Discharge Physician: Alberteen Sam   PCP: Aliene Beams, MD     Recommendations at discharge:  Follow up with PCP Dr. Tracie Harrier in 1 week for seizure Please follow up new mirtazapine for depression      Discharge Diagnoses: Principal Problem:   Seizure Active Problems:   Essential hypertension   COPD (chronic obstructive pulmonary disease) (HCC)   Tobacco abuse   AKI (acute kidney injury) (HCC)   Acute metabolic encephalopathy   Lactic acidosis due to seizure   Hypomagnesemia   Depression    Hospital Course: Scott Jackson is a 73 y.o. M with alcohol use disorder, recurrent seizures in setting of alcohol, Keppra noncompliance, and HTN who presented with being found on the floor, confused.  In the ER, CT head unremarkable, lactic acid 4, creatinine mildly elevated.  Chest x-ray, urinalysis not suggestive of infection.    Seizure due to Keppra noncompliance and alcohol use Patient was observed overnight, and in the morning his encephalopathy from postictal state, had resolved.  His lactic acid was elevated due to seizure.     CT head and EEG were unremarkable.  He reported to neurology and nursing that he had been noncompliant with Keppra.  Family confirmed same and reported he had also been taken to the liquor store the day before admission.  He returned to baseline and was discharged with refill of Keppra.  Advised to abstain from alcohol.  AKI Creatinine 1.4 on admission, resolved to baseline 1.1 with fluids.  Depression Family reported that the patient had been depressed, listless, neglecting self-care since the recent death of his mother with whom he had lived for many years.  Given his weight loss, depression, he was started on mirtazapine at discharge, recommended to follow-up with PCP.            The [facility] Controlled Substances Registry was reviewed for this patient prior to discharge.  Consultants: Neurology Procedures performed: CT head EEG  Disposition: Home Diet recommendation:  Regular diet  DISCHARGE MEDICATION: Allergies as of 10/25/2023   No Known Allergies      Medication List     TAKE these medications    amLODipine 10 MG tablet Commonly known as: NORVASC Take 1 tablet (10 mg total) by mouth daily. For BP   B-12 1000 MCG Caps Take 1 capsule by mouth daily.   folic acid 1 MG tablet Commonly known as: FOLVITE Take 1 tablet (1 mg total) by mouth daily.   hydrALAZINE 50 MG tablet Commonly known as: APRESOLINE Take 1 tablet (50 mg total) by mouth 3 (three) times daily. For BP and Heart   levETIRAcetam 1000 MG tablet Commonly known as: KEPPRA Take 1 tablet (1,000 mg total) by mouth 2 (two) times daily.   methocarbamol 750 MG tablet Commonly known as: ROBAXIN Take 750 mg by mouth every 8 (eight) hours as needed.   mirtazapine 15 MG tablet Commonly known as: Remeron Take 1 tablet (15 mg total) by mouth at bedtime.   tamsulosin 0.4 MG Caps capsule Commonly known as: FLOMAX Take 1 capsule (0.4 mg total) by mouth daily after supper.   thiamine 100 MG tablet Commonly known as: VITAMIN B1 Take 1 tablet (100 mg total) by mouth daily.         Discharge Instructions     Discharge instructions   Complete  by: As directed    **IMPORTANT DISCHARGE INSTRUCTIONS**   From Dr. Maryfrances Bunnell: You were admitted for a seizure.    Here, you were evaluated with a CAT scan of the head (which showed nothing new) and an EEG (the electrical activity of the brain) which showed no active problems  We believe you had a seizure.    This was likely related to not taking your Keppra on schedule and drinking alcohol.  IMPORTANT: take your Keppra as prescribed Do not drink alcohol  To help with feelings of sadness and depression, I recommend starting the new medicine  mirtazapine 15 mg nightly In addition to helping people with feelings of sadness and depressed mood, it can help people sleep and increases appetite  Start mirtazapine 15 mg nightly and go see your primary doctor in 1 week   Increase activity slowly   Complete by: As directed        Discharge Exam: Filed Weights   10/25/23 0900  Weight: 63 kg    General: Pt is alert, awake, not in acute distress Cardiovascular: RRR, nl S1-S2, no murmurs appreciated.   No LE edema.   Respiratory: Normal respiratory rate and rhythm.  CTAB without rales or wheezes. Abdominal: Abdomen soft and non-tender.  No distension or HSM.   Neuro/Psych: Strength symmetric in upper and lower extremities.  Judgment and insight appear normal.   Condition at discharge: fair  The results of significant diagnostics from this hospitalization (including imaging, microbiology, ancillary and laboratory) are listed below for reference.   Imaging Studies: EEG adult Result Date: 10/25/2023 Charlsie Quest, MD     10/25/2023  4:44 PM Patient Name: Scott Jackson MRN: [id number] Epilepsy Attending: Charlsie Quest Referring Physician/Provider: Frankey Shown, DO Date: 10/25/2023 Duration: 22.01 mins Patient history: 73 year old male with history of epilepsy presented with altered mental status.  EEG to evaluate for seizure Level of alertness: Awake AEDs during EEG study: LEV Technical aspects: This EEG study was done with scalp electrodes positioned according to the 10-20 International system of electrode placement. Electrical activity was reviewed with band pass filter of 1-70Hz , sensitivity of 7 uV/mm, display speed of 68mm/sec with a 60Hz  notched filter applied as appropriate. EEG data were recorded continuously and digitally stored.  Video monitoring was available and reviewed as appropriate. Description: The posterior dominant rhythm consists of 9 Hz activity of moderate voltage (25-35 uV) seen predominantly in posterior head  regions, symmetric and reactive to eye opening and eye closing. Hyperventilation and photic stimulation were not performed.   IMPRESSION: This study is within normal limits. No seizures or epileptiform discharges were seen throughout the recording. A normal interictal EEG does not exclude the diagnosis of epilepsy. Charlsie Quest   CT Head Wo Contrast Result Date: 10/24/2023 CLINICAL DATA:  Delirium. Potential fall. Altered mental status. History of alcoholism. EXAM: CT HEAD WITHOUT CONTRAST TECHNIQUE: Contiguous axial images were obtained from the base of the skull through the vertex without intravenous contrast. RADIATION DOSE REDUCTION: This exam was performed according to the departmental dose-optimization program which includes automated exposure control, adjustment of the mA and/or kV according to patient size and/or use of iterative reconstruction technique. COMPARISON:  01/06/2022 FINDINGS: Brain: No intracranial hemorrhage, mass effect, or evidence of acute infarct. No hydrocephalus. No extra-axial fluid collection. Age advanced cerebral atrophy and chronic small vessel ischemic disease. Vascular: No hyperdense vessel. Intracranial arterial calcification. Skull: No fracture or focal lesion. Sinuses/Orbits: No acute finding. Other: None. IMPRESSION: 1. No acute intracranial  abnormality. 2. Age advanced cerebral atrophy. Electronically Signed   By: Minerva Fester M.D.   On: 10/24/2023 19:42   DG Chest Portable 1 View Result Date: 10/24/2023 CLINICAL DATA:  Altered mental status EXAM: PORTABLE CHEST 1 VIEW COMPARISON:  Chest radiograph dated 01/06/2022 FINDINGS: Normal lung volumes. No focal consolidations. No pleural effusion or pneumothorax. The heart size and mediastinal contours are within normal limits. Old right posterior rib fractures. IMPRESSION: No acute disease. Electronically Signed   By: Agustin Cree M.D.   On: 10/24/2023 18:24    Microbiology: Results for orders placed or performed during  the hospital encounter of 10/24/23  Resp panel by RT-PCR (RSV, Flu A&B, Covid) Anterior Nasal Swab     Status: None   Collection Time: 10/24/23  6:26 PM   Specimen: Anterior Nasal Swab  Result Value Ref Range Status   SARS Coronavirus 2 by RT PCR NEGATIVE NEGATIVE Final    Comment: (NOTE) SARS-CoV-2 target nucleic acids are NOT DETECTED.  The SARS-CoV-2 RNA is generally detectable in upper respiratory specimens during the acute phase of infection. The lowest concentration of SARS-CoV-2 viral copies this assay can detect is 138 copies/mL. A negative result does not preclude SARS-Cov-2 infection and should not be used as the sole basis for treatment or other patient management decisions. A negative result may occur with  improper specimen collection/handling, submission of specimen other than nasopharyngeal swab, presence of viral mutation(s) within the areas targeted by this assay, and inadequate number of viral copies(<138 copies/mL). A negative result must be combined with clinical observations, patient history, and epidemiological information. The expected result is Negative.  Fact Sheet for Patients:  BloggerCourse.com  Fact Sheet for Healthcare Providers:  SeriousBroker.it  This test is no t yet approved or cleared by the [place] FDA and  has been authorized for detection and/or diagnosis of SARS-CoV-2 by FDA under an Emergency Use Authorization (EUA). This EUA will remain  in effect (meaning this test can be used) for the duration of the COVID-19 declaration under Section 564(b)(1) of the Act, 21 U.S.C.section 360bbb-3(b)(1), unless the authorization is terminated  or revoked sooner.       Influenza A by PCR NEGATIVE NEGATIVE Final   Influenza B by PCR NEGATIVE NEGATIVE Final    Comment: (NOTE) The Xpert Xpress SARS-CoV-2/FLU/RSV plus assay is intended as an aid in the diagnosis of influenza from Nasopharyngeal swab  specimens and should not be used as a sole basis for treatment. Nasal washings and aspirates are unacceptable for Xpert Xpress SARS-CoV-2/FLU/RSV testing.  Fact Sheet for Patients: BloggerCourse.com  Fact Sheet for Healthcare Providers: SeriousBroker.it  This test is not yet approved or cleared by the [place] FDA and has been authorized for detection and/or diagnosis of SARS-CoV-2 by FDA under an Emergency Use Authorization (EUA). This EUA will remain in effect (meaning this test can be used) for the duration of the COVID-19 declaration under Section 564(b)(1) of the Act, 21 U.S.C. section 360bbb-3(b)(1), unless the authorization is terminated or revoked.     Resp Syncytial Virus by PCR NEGATIVE NEGATIVE Final    Comment: (NOTE) Fact Sheet for Patients: BloggerCourse.com  Fact Sheet for Healthcare Providers: SeriousBroker.it  This test is not yet approved or cleared by the [place] FDA and has been authorized for detection and/or diagnosis of SARS-CoV-2 by FDA under an Emergency Use Authorization (EUA). This EUA will remain in effect (meaning this test can be used) for the duration of the COVID-19 declaration under Section 564(b)(1)  of the Act, 21 U.S.C. section 360bbb-3(b)(1), unless the authorization is terminated or revoked.  Performed at [facility], [address], [place], [place] [address]     Labs: CBC: Recent Labs  Lab 10/24/23 1810 10/25/23 0254  WBC 6.4 5.4  NEUTROABS 4.6  --   HGB 12.6* 11.2*  HCT 38.2* 33.7*  MCV 97.0 97.4  PLT 105* 172   Basic Metabolic Panel: Recent Labs  Lab 10/24/23 1900 10/25/23 0254  NA 135 132*  K 4.6 4.4  CL 94* 95*  CO2 19* 22  GLUCOSE 81 77  BUN 16 17  CREATININE 1.48* 1.18  CALCIUM 9.3 8.6*  MG 1.4* 1.6*  PHOS  --  3.7   Liver Function Tests: Recent Labs  Lab 10/24/23 1900 10/25/23 0254  AST 56*  39  ALT 23 19  ALKPHOS 47 39  BILITOT 1.4* 1.3*  PROT 7.1 6.6  ALBUMIN 3.7 3.2*   CBG: No results for input(s): "GLUCAP" in the last 168 hours.  Discharge time spent: approximately 45 minutes spent on discharge counseling, evaluation of patient on day of discharge, and coordination of discharge planning with nursing, social work, pharmacy and case management  Signed: Alberteen Sam, MD [facility] 10/25/2023

## 2023-10-25 NOTE — ED Notes (Signed)
 Patient is resting comfortably.

## 2023-10-25 NOTE — Progress Notes (Signed)
 EEG complete - results pending

## 2023-10-25 NOTE — Plan of Care (Signed)

## 2023-10-25 NOTE — Consult Note (Signed)
 I connected with  Scott Jackson on 10/25/23 by a video enabled telemedicine application and verified that I am speaking with the correct person using two identifiers.   I discussed the limitations of evaluation and management by telemedicine. The patient expressed understanding and agreed to proceed.  Location of patient: [facility] Outpatient physician: [facility]   Neurology Consultation Reason for Consult: Seizure Referring Physician: Dr Joen Laura  CC: Seizure  History is obtained from: Patient, chart review  HPI: Scott Jackson is a 73 y.o. male with past medical history of alcohol use disorder, seizures on Keppra who was brought in by EMS after fall and altered mental status.  No family at bedside.  However per chart review patient was found on the floor at around 4-4:30 PM yesterday evening.  No clinical seizures were witnessed but patient states he has not been compliant with his Keppra. Patient has history of alcohol abuse and continues to drink vodka with last drink most likely couple of days ago.  Home antiseizure medications: Keppra 1000 mg twice daily  ROS: All other systems reviewed and negative except as noted in the HPI.   Past Medical History:  Diagnosis Date   Alcoholic dementia (HCC)    Anemia    ETOH abuse    Gastritis and duodenitis    Hypertension    Pulmonary nodule    Seizures (HCC)    Tobacco abuse     Family History  Problem Relation Age of Onset   Hypertension Mother    Stroke Mother    Breast cancer Sister    Breast cancer Sister    Fibromyalgia Daughter    Lupus Daughter    Anxiety disorder Daughter    Post-traumatic stress disorder Daughter    Asthma Daughter    Colon cancer Neg Hx    Seizures Neg Hx     Social History:  reports that he has been smoking cigarettes. He has a 12.5 pack-year smoking history. He has never used smokeless tobacco. He reports current alcohol use of about 4.0 standard drinks of alcohol  per week. He reports that he does not use drugs.   Medications Prior to Admission  Medication Sig Dispense Refill Last Dose/Taking   amLODipine (NORVASC) 10 MG tablet Take 1 tablet (10 mg total) by mouth daily. For BP 90 tablet 3 Past Week   Cyanocobalamin (B-12) 1000 MCG CAPS Take 1 capsule by mouth daily.   Past Week   folic acid (FOLVITE) 1 MG tablet Take 1 tablet (1 mg total) by mouth daily. 30 tablet 5 Past Week   hydrALAZINE (APRESOLINE) 50 MG tablet Take 1 tablet (50 mg total) by mouth 3 (three) times daily. For BP and Heart 90 tablet 3 Past Week   levETIRAcetam (KEPPRA) 1000 MG tablet Take 1 tablet (1,000 mg total) by mouth 2 (two) times daily. 180 tablet 3 Past Week   methocarbamol (ROBAXIN) 750 MG tablet Take 750 mg by mouth every 8 (eight) hours as needed.   Unknown   tamsulosin (FLOMAX) 0.4 MG CAPS capsule Take 1 capsule (0.4 mg total) by mouth daily after supper. 30 capsule 3 Past Week   thiamine 100 MG tablet Take 1 tablet (100 mg total) by mouth daily. 30 tablet 3 Past Week      Exam: Current vital signs: BP (!) 150/87 (BP Location: Left Arm)   Pulse 70   Temp 97.6 F (36.4 C) (Oral)   Resp 20   Ht 6' 0.99" (1.854  m)   Wt 63 kg   SpO2 100%   BMI 18.33 kg/m  Vital signs in last 24 hours: Temp:  [97.6 F (36.4 C)-99.1 F (37.3 C)] 97.6 F (36.4 C) (04/02 1300) Pulse Rate:  [64-97] 70 (04/02 0900) Resp:  [12-28] 20 (04/02 0900) BP: (124-157)/(76-97) 150/87 (04/02 1300) SpO2:  [96 %-100 %] 100 % (04/02 1300) Weight:  [63 kg] 63 kg (04/02 0900)   Physical Exam  Constitutional: Appears well-developed and well-nourished.  Psych: Affect appropriate to situation Neuro: AO x 3, cranial nerves intact, antigravity strength without drift in all 4 extremities, sensory intact to light touch, FTN intact bilaterally  I have reviewed labs in epic and the results pertinent to this consultation are: CBC:  Recent Labs  Lab 10/24/23 1810 10/25/23 0254  WBC 6.4 5.4   NEUTROABS 4.6  --   HGB 12.6* 11.2*  HCT 38.2* 33.7*  MCV 97.0 97.4  PLT 105* 172    Basic Metabolic Panel:  Lab Results  Component Value Date   NA 132 (L) 10/25/2023   K 4.4 10/25/2023   CO2 22 10/25/2023   GLUCOSE 77 10/25/2023   BUN 17 10/25/2023   CREATININE 1.18 10/25/2023   CALCIUM 8.6 (L) 10/25/2023   GFRNONAA >60 10/25/2023   GFRAA >60 12/17/2019   Lipid Panel:  Lab Results  Component Value Date   LDLCALC 86 11/23/2017   HgbA1c:  Lab Results  Component Value Date   HGBA1C 5.7 (H) 11/23/2017   Urine Drug Screen:     Component Value Date/Time   LABOPIA NONE DETECTED 10/24/2023 2104   COCAINSCRNUR NONE DETECTED 10/24/2023 2104   LABBENZ NONE DETECTED 10/24/2023 2104   AMPHETMU NONE DETECTED 10/24/2023 2104   THCU NONE DETECTED 10/24/2023 2104   LABBARB NONE DETECTED 10/24/2023 2104    Alcohol Level     Component Value Date/Time   ETH <10 10/24/2023 1810     I have reviewed the images obtained:   CT Head without contrast 10/24/2023: No acute intracranial abnormality. Age advanced cerebral atrophy.   ASSESSMENT/PLAN: 73 year old male with history of alcohol use, seizures who presented with altered mental status.  Acute encephalopathy, likely postictal Hyponatremia Lactic acidosis (resolved) -Patient had lactic acidosis at the time of arrival which is gradually improved.  No evidence of infection. -With quick improvement in mental status and history of medication noncompliance and negative workup for infection/toxic-metabolic etiology, patient most likely had a seizure  Recommendations: -Recommend resuming Keppra 1000 mg twice daily Discussed importance of medication compliance with patient.  Patient denies any medication side effects -Counseled about alcohol cessation -Discussed seizure provoking factors including alcohol use, lack of sleep, medication noncompliance, infections -Patient states he does not drive.  Seizure precautions discussed -Can  continue to follow-up with [facility] -Discussed plan with Dr. Maryfrances Bunnell via secure chat   Thank you for allowing [place] to participate in the care of this patient. If you have any further questions, please contact  me or neurohospitalist.   Lindie Spruce Epilepsy [facility]

## 2023-10-25 NOTE — Procedures (Signed)
 Patient Name: Scott Jackson  MRN: [id number]  Epilepsy Attending: Charlsie Quest  Referring Physician/Provider: Frankey Shown, DO  Date: 10/25/2023 Duration: 22.01 mins  Patient history: 73 year old male with history of epilepsy presented with altered mental status.  EEG to evaluate for seizure  Level of alertness: Awake  AEDs during EEG study: LEV  Technical aspects: This EEG study was done with scalp electrodes positioned according to the 10-20 International system of electrode placement. Electrical activity was reviewed with band pass filter of 1-70Hz , sensitivity of 7 uV/mm, display speed of 78mm/sec with a 60Hz  notched filter applied as appropriate. EEG data were recorded continuously and digitally stored.  Video monitoring was available and reviewed as appropriate.  Description: The posterior dominant rhythm consists of 9 Hz activity of moderate voltage (25-35 uV) seen predominantly in posterior head regions, symmetric and reactive to eye opening and eye closing. Hyperventilation and photic stimulation were not performed.     IMPRESSION: This study is within normal limits. No seizures or epileptiform discharges were seen throughout the recording.  A normal interictal EEG does not exclude the diagnosis of epilepsy.  Josehua Hammar Annabelle Harman

## 2023-10-26 LAB — LEVETIRACETAM LEVEL: Levetiracetam Lvl: 15.5 ug/mL (ref 10.0–40.0)

## 2024-06-03 ENCOUNTER — Telehealth: Payer: Self-pay | Admitting: Adult Health

## 2024-06-03 NOTE — Telephone Encounter (Signed)
 Appointment details confirmed with daughter

## 2024-06-06 ENCOUNTER — Observation Stay (HOSPITAL_COMMUNITY)
Admission: EM | Admit: 2024-06-06 | Discharge: 2024-06-08 | Disposition: A | Discharge status: Home / Self Care | Attending: Internal Medicine | Admitting: Internal Medicine

## 2024-06-06 ENCOUNTER — Encounter (HOSPITAL_COMMUNITY): Payer: Self-pay | Admitting: Family Medicine

## 2024-06-06 ENCOUNTER — Other Ambulatory Visit: Payer: Self-pay

## 2024-06-06 ENCOUNTER — Emergency Department (HOSPITAL_COMMUNITY)

## 2024-06-06 DIAGNOSIS — N179 Acute kidney failure, unspecified: Secondary | ICD-10-CM | POA: Diagnosis not present

## 2024-06-06 DIAGNOSIS — R627 Adult failure to thrive: Secondary | ICD-10-CM | POA: Diagnosis present

## 2024-06-06 DIAGNOSIS — I959 Hypotension, unspecified: Secondary | ICD-10-CM | POA: Diagnosis not present

## 2024-06-06 DIAGNOSIS — E785 Hyperlipidemia, unspecified: Secondary | ICD-10-CM | POA: Diagnosis present

## 2024-06-06 DIAGNOSIS — D61818 Other pancytopenia: Secondary | ICD-10-CM | POA: Diagnosis not present

## 2024-06-06 DIAGNOSIS — Z79899 Other long term (current) drug therapy: Secondary | ICD-10-CM | POA: Diagnosis not present

## 2024-06-06 DIAGNOSIS — F17210 Nicotine dependence, cigarettes, uncomplicated: Secondary | ICD-10-CM | POA: Insufficient documentation

## 2024-06-06 DIAGNOSIS — J449 Chronic obstructive pulmonary disease, unspecified: Secondary | ICD-10-CM | POA: Diagnosis not present

## 2024-06-06 DIAGNOSIS — R55 Syncope and collapse: Secondary | ICD-10-CM | POA: Diagnosis present

## 2024-06-06 DIAGNOSIS — I10 Essential (primary) hypertension: Secondary | ICD-10-CM | POA: Insufficient documentation

## 2024-06-06 DIAGNOSIS — F1010 Alcohol abuse, uncomplicated: Secondary | ICD-10-CM | POA: Diagnosis not present

## 2024-06-06 DIAGNOSIS — Z720 Tobacco use: Secondary | ICD-10-CM | POA: Diagnosis present

## 2024-06-06 DIAGNOSIS — G40909 Epilepsy, unspecified, not intractable, without status epilepticus: Secondary | ICD-10-CM

## 2024-06-06 DIAGNOSIS — Z91148 Patient's other noncompliance with medication regimen for other reason: Secondary | ICD-10-CM

## 2024-06-06 DIAGNOSIS — R7401 Elevation of levels of liver transaminase levels: Secondary | ICD-10-CM | POA: Diagnosis present

## 2024-06-06 DIAGNOSIS — G9341 Metabolic encephalopathy: Principal | ICD-10-CM | POA: Diagnosis present

## 2024-06-06 DIAGNOSIS — E43 Unspecified severe protein-calorie malnutrition: Secondary | ICD-10-CM | POA: Insufficient documentation

## 2024-06-06 DIAGNOSIS — R4182 Altered mental status, unspecified: Secondary | ICD-10-CM | POA: Diagnosis not present

## 2024-06-06 DIAGNOSIS — G4089 Other seizures: Secondary | ICD-10-CM | POA: Diagnosis not present

## 2024-06-06 LAB — PROTIME-INR
INR: 0.9 (ref 0.8–1.2)
Prothrombin Time: 12.8 s (ref 11.4–15.2)

## 2024-06-06 LAB — LIPID PANEL
Cholesterol: 231 mg/dL — ABNORMAL HIGH (ref 0–200)
HDL: 89 mg/dL (ref >40)
LDL Cholesterol: 106 mg/dL — ABNORMAL HIGH (ref 0–99)
Total CHOL/HDL Ratio: 2.6 ratio
Triglycerides: 183 mg/dL — ABNORMAL HIGH (ref <150)
VLDL: 37 mg/dL (ref 0–40)

## 2024-06-06 LAB — COMPREHENSIVE METABOLIC PANEL WITH GFR
ALT: 70 U/L — ABNORMAL HIGH (ref 0–44)
AST: 385 U/L — ABNORMAL HIGH (ref 15–41)
Albumin: 4.4 g/dL (ref 3.5–5.0)
Alkaline Phosphatase: 41 U/L (ref 38–126)
Anion gap: 24 — ABNORMAL HIGH (ref 5–15)
BUN: 15 mg/dL (ref 8–23)
CO2: 22 mmol/L (ref 22–32)
Calcium: 9.7 mg/dL (ref 8.9–10.3)
Chloride: 89 mmol/L — ABNORMAL LOW (ref 98–111)
Creatinine, Ser: 1.26 mg/dL — ABNORMAL HIGH (ref 0.61–1.24)
GFR, Estimated: >60 mL/min (ref >60)
Glucose, Bld: 119 mg/dL — ABNORMAL HIGH (ref 70–99)
Potassium: 3.8 mmol/L (ref 3.5–5.1)
Sodium: 135 mmol/L (ref 135–145)
Total Bilirubin: 0.5 mg/dL (ref 0.0–1.2)
Total Protein: 7.1 g/dL (ref 6.5–8.1)

## 2024-06-06 LAB — URINALYSIS, ROUTINE W REFLEX MICROSCOPIC
Bacteria, UA: NONE SEEN
Bilirubin Urine: NEGATIVE
Glucose, UA: NEGATIVE mg/dL
Hgb urine dipstick: NEGATIVE
Ketones, ur: 20 mg/dL — AB
Leukocytes,Ua: NEGATIVE
Nitrite: NEGATIVE
Protein, ur: 30 mg/dL — AB
Specific Gravity, Urine: 1.017 (ref 1.005–1.030)
pH: 5 (ref 5.0–8.0)

## 2024-06-06 LAB — HEPATITIS PANEL, ACUTE
HCV Ab: NONREACTIVE
Hep A IgM: NONREACTIVE
Hep B C IgM: NONREACTIVE
Hepatitis B Surface Ag: NONREACTIVE

## 2024-06-06 LAB — CBC
HCT: 36.9 % — ABNORMAL LOW (ref 39.0–52.0)
Hemoglobin: 12.5 g/dL — ABNORMAL LOW (ref 13.0–17.0)
MCH: 31.2 pg (ref 26.0–34.0)
MCHC: 33.9 g/dL (ref 30.0–36.0)
MCV: 92 fL (ref 80.0–100.0)
Platelets: 119 K/uL — ABNORMAL LOW (ref 150–400)
RBC: 4.01 MIL/uL — ABNORMAL LOW (ref 4.22–5.81)
RDW: 15.3 % (ref 11.5–15.5)
WBC: 3.9 K/uL — ABNORMAL LOW (ref 4.0–10.5)
nRBC: 0 % (ref 0.0–0.2)

## 2024-06-06 LAB — URINE DRUG SCREEN
Amphetamines: NEGATIVE
Barbiturates: NEGATIVE
Benzodiazepines: NEGATIVE
Cocaine: NEGATIVE
Fentanyl: NEGATIVE
Methadone Scn, Ur: NEGATIVE
Opiates: NEGATIVE
Tetrahydrocannabinol: NEGATIVE

## 2024-06-06 LAB — MAGNESIUM: Magnesium: 1.2 mg/dL — ABNORMAL LOW (ref 1.7–2.4)

## 2024-06-06 LAB — DIFFERENTIAL
Abs Immature Granulocytes: 0.02 K/uL (ref 0.00–0.07)
Basophils Absolute: 0 K/uL (ref 0.0–0.1)
Basophils Relative: 0 %
Eosinophils Absolute: 0 K/uL (ref 0.0–0.5)
Eosinophils Relative: 0 %
Immature Granulocytes: 1 %
Lymphocytes Relative: 35 %
Lymphs Abs: 1.4 K/uL (ref 0.7–4.0)
Monocytes Absolute: 0.3 K/uL (ref 0.1–1.0)
Monocytes Relative: 7 %
Neutro Abs: 2.2 K/uL (ref 1.7–7.7)
Neutrophils Relative %: 57 %

## 2024-06-06 LAB — ETHANOL: Alcohol, Ethyl (B): <15 mg/dL (ref <15)

## 2024-06-06 LAB — TROPONIN T, HIGH SENSITIVITY
Troponin T High Sensitivity: <15 ng/L (ref 0–19)
Troponin T High Sensitivity: <15 ng/L (ref 0–19)

## 2024-06-06 LAB — CBG MONITORING, ED: Glucose-Capillary: 122 mg/dL — ABNORMAL HIGH (ref 70–99)

## 2024-06-06 LAB — PHOSPHORUS: Phosphorus: 1.8 mg/dL — ABNORMAL LOW (ref 2.5–4.6)

## 2024-06-06 MED ORDER — ENSURE PLUS HIGH PROTEIN PO LIQD
237.0000 mL | Freq: Two times a day (BID) | ORAL | Status: DC
  Administered 2024-06-07: 237 mL via ORAL

## 2024-06-06 MED ORDER — THIAMINE MONONITRATE 100 MG PO TABS
100.0000 mg | ORAL_TABLET | Freq: Every day | ORAL | Status: DC
  Administered 2024-06-06 – 2024-06-07 (×2): 100 mg via ORAL
  Filled 2024-06-06 (×2): qty 1

## 2024-06-06 MED ORDER — ACETAMINOPHEN 325 MG PO TABS: 650.0000 mg | ORAL_TABLET | Freq: Four times a day (QID) | ORAL | Status: DC | PRN

## 2024-06-06 MED ORDER — IPRATROPIUM BROMIDE 0.02 % IN SOLN: 0.5000 mg | Freq: Four times a day (QID) | RESPIRATORY_TRACT | Status: DC | PRN

## 2024-06-06 MED ORDER — SODIUM CHLORIDE 0.9% FLUSH
3.0000 mL | Freq: Two times a day (BID) | INTRAVENOUS | Status: DC
  Administered 2024-06-06 – 2024-06-08 (×3): 3 mL via INTRAVENOUS

## 2024-06-06 MED ORDER — TAMSULOSIN HCL 0.4 MG PO CAPS
0.4000 mg | ORAL_CAPSULE | Freq: Every day | ORAL | Status: DC
  Administered 2024-06-07: 0.4 mg via ORAL
  Filled 2024-06-06: qty 1

## 2024-06-06 MED ORDER — MAGNESIUM SULFATE 2 GM/50ML IV SOLN
2.0000 g | Freq: Once | INTRAVENOUS | Status: AC
  Administered 2024-06-06: 2 g via INTRAVENOUS
  Filled 2024-06-06: qty 50

## 2024-06-06 MED ORDER — SENNOSIDES-DOCUSATE SODIUM 8.6-50 MG PO TABS: 1.0000 | ORAL_TABLET | Freq: Every evening | ORAL | Status: DC | PRN

## 2024-06-06 MED ORDER — OXYCODONE HCL 5 MG PO TABS: 5.0000 mg | ORAL_TABLET | ORAL | Status: DC | PRN

## 2024-06-06 MED ORDER — ACETAMINOPHEN 650 MG RE SUPP: 650.0000 mg | Freq: Four times a day (QID) | RECTAL | Status: DC | PRN

## 2024-06-06 MED ORDER — LORAZEPAM 2 MG/ML IJ SOLN: 1.0000 mg | INTRAMUSCULAR | Status: DC | PRN

## 2024-06-06 MED ORDER — HYDROMORPHONE HCL 1 MG/ML IJ SOLN: 0.5000 mg | INTRAMUSCULAR | Status: DC | PRN

## 2024-06-06 MED ORDER — LORAZEPAM 1 MG PO TABS: 1.0000 mg | ORAL_TABLET | ORAL | Status: DC | PRN

## 2024-06-06 MED ORDER — SODIUM CHLORIDE 0.9 % IV SOLN: INTRAVENOUS | Status: AC

## 2024-06-06 MED ORDER — MIRTAZAPINE 15 MG PO TABS: 15.0000 mg | ORAL_TABLET | Freq: Every day | ORAL | Status: DC

## 2024-06-06 MED ORDER — FLEET ENEMA RE ENEM: 1.0000 | ENEMA | Freq: Once | RECTAL | Status: DC | PRN

## 2024-06-06 MED ORDER — MAGNESIUM OXIDE -MG SUPPLEMENT 400 (240 MG) MG PO TABS
400.0000 mg | ORAL_TABLET | Freq: Two times a day (BID) | ORAL | Status: DC
  Administered 2024-06-07: 400 mg via ORAL
  Filled 2024-06-06: qty 1

## 2024-06-06 MED ORDER — ONDANSETRON HCL 4 MG/2ML IJ SOLN: 4.0000 mg | Freq: Four times a day (QID) | INTRAMUSCULAR | Status: DC | PRN

## 2024-06-06 MED ORDER — ADULT MULTIVITAMIN W/MINERALS CH
1.0000 | ORAL_TABLET | Freq: Every day | ORAL | Status: DC
  Administered 2024-06-06 – 2024-06-08 (×3): 1 via ORAL
  Filled 2024-06-06 (×3): qty 1

## 2024-06-06 MED ORDER — ONDANSETRON HCL 4 MG PO TABS: 4.0000 mg | ORAL_TABLET | Freq: Four times a day (QID) | ORAL | Status: DC | PRN

## 2024-06-06 MED ORDER — MIRTAZAPINE 15 MG PO TABS
15.0000 mg | ORAL_TABLET | Freq: Every day | ORAL | Status: DC
  Administered 2024-06-06 – 2024-06-07 (×2): 15 mg via ORAL
  Filled 2024-06-06 (×2): qty 1

## 2024-06-06 MED ORDER — HEPARIN SODIUM (PORCINE) 5000 UNIT/ML IJ SOLN
5000.0000 [IU] | Freq: Three times a day (TID) | INTRAMUSCULAR | Status: DC
  Administered 2024-06-06 – 2024-06-07 (×2): 5000 [IU] via SUBCUTANEOUS
  Filled 2024-06-06 (×2): qty 1

## 2024-06-06 MED ORDER — THIAMINE HCL 100 MG/ML IJ SOLN: 100.0000 mg | Freq: Every day | INTRAMUSCULAR | Status: DC

## 2024-06-06 MED ORDER — MAGNESIUM OXIDE -MG SUPPLEMENT 400 (240 MG) MG PO TABS: 400.0000 mg | ORAL_TABLET | Freq: Two times a day (BID) | ORAL | Status: DC

## 2024-06-06 MED ORDER — HYDRALAZINE HCL 20 MG/ML IJ SOLN
10.0000 mg | INTRAMUSCULAR | Status: DC | PRN
Start: 2024-06-06 — End: 2024-06-08

## 2024-06-06 MED ORDER — TRAZODONE HCL 50 MG PO TABS: 25.0000 mg | ORAL_TABLET | Freq: Every evening | ORAL | Status: DC | PRN

## 2024-06-06 MED ORDER — LEVETIRACETAM 500 MG PO TABS
1000.0000 mg | ORAL_TABLET | Freq: Two times a day (BID) | ORAL | Status: DC
  Administered 2024-06-06 – 2024-06-08 (×4): 1000 mg via ORAL
  Filled 2024-06-06 (×4): qty 2

## 2024-06-06 MED ORDER — SODIUM CHLORIDE 0.9 % IV BOLUS
1000.0000 mL | Freq: Once | INTRAVENOUS | Status: AC
  Administered 2024-06-06: 1000 mL via INTRAVENOUS

## 2024-06-06 MED ORDER — BISACODYL 5 MG PO TBEC
5.0000 mg | DELAYED_RELEASE_TABLET | Freq: Every day | ORAL | Status: DC | PRN
Start: 2024-06-06 — End: 2024-06-08

## 2024-06-06 MED ORDER — FOLIC ACID 1 MG PO TABS
1.0000 mg | ORAL_TABLET | Freq: Every day | ORAL | Status: DC
  Administered 2024-06-06 – 2024-06-08 (×3): 1 mg via ORAL
  Filled 2024-06-06 (×3): qty 1

## 2024-06-06 MED ORDER — SODIUM CHLORIDE 0.9 % IV BOLUS
1000.0000 mL | Freq: Once | INTRAVENOUS | Status: AC
Start: 2024-06-06 — End: 2024-06-06
  Administered 2024-06-06: 1000 mL via INTRAVENOUS

## 2024-06-06 NOTE — ED Notes (Signed)
 Patient transported to CT

## 2024-06-06 NOTE — Progress Notes (Signed)
   06/06/24 1545  TOC Brief Assessment  Insurance and Status Reviewed  Patient has primary care physician Yes  Home environment has been reviewed Home  Prior level of function: independent  Prior/Current Home Services No current home services  Social Drivers of Health Review SDOH reviewed no interventions necessary  Readmission risk has been reviewed Yes  Transition of care needs no transition of care needs at this time   Inpatient Care Manager (ICM) has reviewed patient and no ICM needs have been identified at this time. We will continue to monitor patient advancement through interdisciplinary progression rounds. If new patient transition needs arise, please place a ICM consult.

## 2024-06-06 NOTE — ED Notes (Signed)
 Attempted to call family and update, left a voicemail with daughter.

## 2024-06-06 NOTE — Assessment & Plan Note (Signed)
 History of alcohol abuse particularly vodka - Monitoring for withdrawals, -CIWA protocols Ativan , along with thiamine  and folate

## 2024-06-06 NOTE — ED Triage Notes (Signed)
 Pt BIB daughter. Per daughter pt has been weak and difficult to arrous today. She states pt has been complaining of neck and right shoulder pain. Pt has history of seizures. Daughter states pt will drink vodka and then forget to take seizure medications. Daughter thinks he possibly had seizure at home and fell and hurt neck and shoulder. Pt is able to freely move arms

## 2024-06-06 NOTE — Assessment & Plan Note (Signed)
-   Elevated liver exam-AST/ALT, likely due to chronic alcohol abuse -Obtaining otitis panel - Continuing IV fluids, monitoring closely

## 2024-06-06 NOTE — Assessment & Plan Note (Signed)
-   Likely due to dehydration, continue IV fluids -Monitoring kidney functions closely

## 2024-06-06 NOTE — ED Notes (Signed)
 Hung a pressure bag for fluids. Pt BP improving

## 2024-06-06 NOTE — ED Provider Notes (Signed)
 [place] EMERGENCY DEPARTMENT AT [facility] Provider Note   CSN: [id number] Arrival date & time: 06/06/24  1125     Patient presents with: Weakness and Seizures   Scott Jackson is a 73 y.o. male.  {Add pertinent medical, surgical, social history, OB history to YEP:67052} Patient has a history of EtOH abuse.  He had an episode where he was having pain in his upper back and had syncopal episode.  Then he came to the emergency department and during the triage he had another syncopal episode and was hypotensive   Weakness Associated symptoms: seizures   Seizures      Prior to Admission medications   Medication Sig Start Date End Date Taking? Authorizing Provider  amLODipine  (NORVASC ) 10 MG tablet Take 1 tablet (10 mg total) by mouth daily. For BP 10/25/23  Yes Danford, Lonni SQUIBB, MD  hydrALAZINE  (APRESOLINE ) 50 MG tablet Take 1 tablet (50 mg total) by mouth 3 (three) times daily. For BP and Heart 10/25/23  Yes Danford, Lonni SQUIBB, MD  levETIRAcetam  (KEPPRA ) 1000 MG tablet Take 1 tablet (1,000 mg total) by mouth 2 (two) times daily. 10/25/23  Yes Danford, Lonni SQUIBB, MD  magnesium  oxide (MAG-OX) 400 (240 Mg) MG tablet Take 1 tablet by mouth 2 (two) times daily. 04/08/24  Yes [provider]  mirtazapine  (REMERON ) 15 MG tablet Take 1 tablet (15 mg total) by mouth at bedtime. 10/25/23 10/24/24 Yes Danford, Lonni SQUIBB, MD  tamsulosin  (FLOMAX ) 0.4 MG CAPS capsule Take 1 capsule (0.4 mg total) by mouth daily after supper. 10/25/23  Yes Danford, Lonni SQUIBB, MD  Cyanocobalamin  (B-12) 1000 MCG CAPS Take 1 capsule by mouth daily. Patient not taking: Reported on 06/06/2024 05/17/23   [provider]    Allergies: Patient has no known allergies.    Review of Systems  Neurological:  Positive for seizures and weakness.    Updated Vital Signs BP 132/81   Pulse (!) 105   Temp 97.8 F (36.6 C) (Oral)   Resp (!) 25   Ht 6' 1 (1.854 m)   Wt 63 kg   SpO2  98%   BMI 18.32 kg/m   Physical Exam  (all labs ordered are listed, but only abnormal results are displayed) Labs Reviewed  COMPREHENSIVE METABOLIC PANEL WITH GFR - Abnormal; Notable for the following components:      Result Value   Chloride 89 (*)    Glucose, Bld 119 (*)    Creatinine, Ser 1.26 (*)    AST 385 (*)    ALT 70 (*)    Anion gap 24 (*)    All other components within normal limits  CBC - Abnormal; Notable for the following components:   WBC 3.9 (*)    RBC 4.01 (*)    Hemoglobin 12.5 (*)    HCT 36.9 (*)    Platelets 119 (*)    All other components within normal limits  CBG MONITORING, ED - Abnormal; Notable for the following components:   Glucose-Capillary 122 (*)    All other components within normal limits  EXPECTORATED SPUTUM ASSESSMENT W GRAM STAIN, RFLX TO RESP C  ETHANOL  DIFFERENTIAL  URINALYSIS, ROUTINE W REFLEX MICROSCOPIC  URINE DRUG SCREEN  PROLACTIN  MAGNESIUM   PHOSPHORUS  PROTIME-INR  LEVETIRACETAM  LEVEL  TROPONIN T, HIGH SENSITIVITY  TROPONIN T, HIGH SENSITIVITY    EKG: None  Radiology: CT Head Wo Contrast Result Date: 06/06/2024 CLINICAL DATA:  Worsening headache.  Weakness.  History of seizures. EXAM:  CT HEAD WITHOUT CONTRAST TECHNIQUE: Contiguous axial images were obtained from the base of the skull through the vertex without intravenous contrast. RADIATION DOSE REDUCTION: This exam was performed according to the departmental dose-optimization program which includes automated exposure control, adjustment of the mA and/or kV according to patient size and/or use of iterative reconstruction technique. COMPARISON:  10/24/2023 FINDINGS: Brain: Examination demonstrates mild age related atrophic change and minimal chronic ischemic microvascular disease which is stable. There is no mass, mass effect, shift of midline structures or acute hemorrhage. No evidence of acute infarction. Vascular: No hyperdense vessel or unexpected calcification. Skull:  Normal. Negative for fracture or focal lesion. Sinuses/Orbits: No acute finding. Other: None. IMPRESSION: 1. No acute findings. 2. Mild age related atrophic change and minimal chronic ischemic microvascular disease. Electronically Signed   By: Toribio Agreste M.D.   On: 06/06/2024 13:46   DG Chest Port 1 View Result Date: 06/06/2024 CLINICAL DATA:  Weakness.  Pain. EXAM: PORTABLE CHEST 1 VIEW COMPARISON:  10/24/2023. FINDINGS: The heart size and mediastinal contours are within normal limits. Hyperinflation. No focal consolidation, pleural effusion, or pneumothorax. Remote right posterior rib fractures. No acute osseous abnormality identified. IMPRESSION: 1. No acute findings in the chest. 2. Hyperinflation. Electronically Signed   By: Harrietta Sherry M.D.   On: 06/06/2024 12:28    {Document cardiac monitor, telemetry assessment procedure when appropriate:32947} Procedures   Medications Ordered in the ED  heparin  injection 5,000 Units (has no administration in time range)  sodium chloride  flush (NS) 0.9 % injection 3 mL (3 mLs Intravenous Not Given 06/06/24 1446)  0.9 %  sodium chloride  infusion ( Intravenous Not Given 06/06/24 1447)  sodium chloride  flush (NS) 0.9 % injection 3 mL (3 mLs Intravenous Not Given 06/06/24 1447)  acetaminophen  (TYLENOL ) tablet 650 mg (has no administration in time range)    Or  acetaminophen  (TYLENOL ) suppository 650 mg (has no administration in time range)  oxyCODONE (Oxy IR/ROXICODONE) immediate release tablet 5 mg (has no administration in time range)  HYDROmorphone  (DILAUDID ) injection 0.5-1 mg (has no administration in time range)  traZODone  (DESYREL ) tablet 25 mg (has no administration in time range)  senna-docusate (Senokot-S) tablet 1 tablet (has no administration in time range)  bisacodyl  (DULCOLAX) EC tablet 5 mg (has no administration in time range)  sodium phosphate (FLEET) enema 1 enema (has no administration in time range)  ondansetron  (ZOFRAN ) tablet  4 mg (has no administration in time range)    Or  ondansetron  (ZOFRAN ) injection 4 mg (has no administration in time range)  ipratropium (ATROVENT) nebulizer solution 0.5 mg (has no administration in time range)  hydrALAZINE  (APRESOLINE ) injection 10 mg (has no administration in time range)  LORazepam  (ATIVAN ) tablet 1-4 mg (has no administration in time range)    Or  LORazepam  (ATIVAN ) injection 1-4 mg (has no administration in time range)  thiamine  (VITAMIN B1) tablet 100 mg (100 mg Oral Given 06/06/24 1507)    Or  thiamine  (VITAMIN B1) injection 100 mg ( Intravenous See Alternative 06/06/24 1507)  folic acid  (FOLVITE ) tablet 1 mg (1 mg Oral Given 06/06/24 1507)  multivitamin with minerals tablet 1 tablet (1 tablet Oral Given 06/06/24 1507)  levETIRAcetam  (KEPPRA ) tablet 1,000 mg (has no administration in time range)  magnesium  oxide (MAG-OX) tablet 400 mg (has no administration in time range)  mirtazapine  (REMERON ) tablet 15 mg (has no administration in time range)  tamsulosin  (FLOMAX ) capsule 0.4 mg (has no administration in time range)  sodium chloride  0.9 % bolus  1,000 mL (0 mLs Intravenous Stopped 06/06/24 1303)  sodium chloride  0.9 % bolus 1,000 mL (0 mLs Intravenous Stopped 06/06/24 1434)  sodium chloride  0.9 % bolus 1,000 mL (1,000 mLs Intravenous New Bag/Given 06/06/24 1434)  CRITICAL CARE Performed by: Fairy Sermon Total critical care time: 15 minutes Critical care time was exclusive of separately billable procedures and treating other patients. Critical care was necessary to treat or prevent imminent or life-threatening deterioration. Critical care was time spent personally by me on the following activities: development of treatment plan with patient and/or surrogate as well as nursing, discussions with consultants, evaluation of patient's response to treatment, examination of patient, obtaining history from patient or surrogate, ordering and performing treatments and  interventions, ordering and review of laboratory studies, ordering and review of radiographic studies, pulse oximetry and re-evaluation of patient's condition.     {Click here for ABCD2, HEART and other calculators REFRESH Note before signing:1}                              Medical Decision Making Amount and/or Complexity of Data Reviewed Labs: ordered. Radiology: ordered. ECG/medicine tests: ordered.  Risk Decision regarding hospitalization.  Syncope with hypotension and history of EtOH abuse with elevated anion gap.  Patient is hydrated will be admitted to medicine.  Patient also has elevated liver enzymes  {Document critical care time when appropriate  Document review of labs and clinical decision tools ie CHADS2VASC2, etc  Document your independent review of radiology images and any outside records  Document your discussion with family members, caretakers and with consultants  Document social determinants of health affecting pt's care  Document your decision making why or why not admission, treatments were needed:32947:::1}   Final diagnoses:  Syncope and collapse    ED Discharge Orders     None

## 2024-06-06 NOTE — Assessment & Plan Note (Addendum)
 Brief altered mental status -possibly transient metabolic encephalopathy -  - Ruling out seizure activities, syncope, currently no signs of alcohol withdrawals - CT head negative - Continue with neurochecks, monitoring closely -Likely symptoms has been exacerbated by hypertension Obtaining orthostatic hypotension BPs, s/p 3 L IV fluid -Withholding BP meds, continue Keppra  - Obtaining EEG,

## 2024-06-06 NOTE — Assessment & Plan Note (Addendum)
-   History of seizure disorder likely due to alcohol -Patient reports he is compliant with Keppra  - Previously recorded noncompliant with medications -Continue home medication of Keppra  1000 mg twice daily -As needed Ativan  for seizure. -Seizure precautions

## 2024-06-06 NOTE — Assessment & Plan Note (Signed)
-   Discussed and recommended strongly for smoking cessation  - NicoDerm patch provided

## 2024-06-06 NOTE — H&P (Signed)
 History and Physical   Patient: Scott Jackson                            PCP: Rolinda Millman, MD                    DOB: 07-21-1951            DOA: 06/06/2024 FMW:[id number]             DOS: 06/06/2024, 4:25 PM  Rolinda Millman, MD  Patient coming from:   HOME  I have personally reviewed patient's medical records, in electronic medical records, including:  [facility], and care everywhere.    Chief Complaint:   Chief Complaint  Patient presents with   Weakness   Seizures    History of present illness:    Scott Jackson is a 73 year old male with extensive history of alcohol (vodka) abuse, seizure disorder in the setting of alcohol use, depression, COPD, HTN, COPD, GERD, tobacco abuse, failure to thrive... Noncompliant, once again presented to the ED with altered mental status.  Patient reports he had an appointment this morning that he was supposed to go with his daughter but he was feeling very dizzy and unsteady on his feet.  He passed out and did not have any seizure-like activities. Reports that he does not drink heavy but he does drink alcohol specifically vodka his last drink was last night. Reports that he is compliant with his medication including Keppra   No report of witnessed loss of consciousness, fall, or seizure-like activities.  Recent hospitalization and discharge for the same April 2025   ED Evaluation:  BP on arrival 79/62 Blood pressure 109/70, pulse 88, resp. rate (!) 25, height 6' 1 (1.854 m), weight 63 kg, SpO2 98%. Labs: Chloride 89, glucose 119, creatinine 1.26, anion gap 24, AST 385, ALT 70, troponin < 15 x 2,, WBC 3.9, hemoglobin 12.5, serum alcohol <15, pending UDS CT head: Chronic ischemic changes atrophy, no acute intracranial abnormalities Chest x-ray -hyperinflated, no acute findings   Requested for patient to be admitted for brief altered mental status, dizziness, hypertensive.    Patient Denies having: Fever, Chills, Cough, SOB, Chest  Pain, Abd pain, N/V/D, headache, dizziness, lightheadedness,  Dysuria, Joint pain, rash, open wounds   Review of Systems: As per HPI, otherwise 10 point review of systems were negative.   ----------------------------------------------------------------------------------------------------------------------  No Known Allergies  Home MEDs:  Prior to Admission medications   Medication Sig Start Date End Date Taking? Authorizing Provider  amLODipine  (NORVASC ) 10 MG tablet Take 1 tablet (10 mg total) by mouth daily. For BP 10/25/23  Yes Danford, Lonni SQUIBB, MD  hydrALAZINE  (APRESOLINE ) 50 MG tablet Take 1 tablet (50 mg total) by mouth 3 (three) times daily. For BP and Heart 10/25/23  Yes Danford, Lonni SQUIBB, MD  levETIRAcetam  (KEPPRA ) 1000 MG tablet Take 1 tablet (1,000 mg total) by mouth 2 (two) times daily. 10/25/23  Yes Danford, Lonni SQUIBB, MD  magnesium  oxide (MAG-OX) 400 (240 Mg) MG tablet Take 1 tablet by mouth 2 (two) times daily. 04/08/24  Yes [provider]  mirtazapine  (REMERON ) 15 MG tablet Take 1 tablet (15 mg total) by mouth at bedtime. 10/25/23 10/24/24 Yes Danford, Lonni SQUIBB, MD  tamsulosin  (FLOMAX ) 0.4 MG CAPS capsule Take 1 capsule (0.4 mg total) by mouth daily after supper. 10/25/23  Yes Danford, Lonni SQUIBB, MD  Cyanocobalamin  (B-12) 1000 MCG CAPS Take 1 capsule  by mouth daily. Patient not taking: Reported on 06/06/2024 05/17/23   [provider]    PRN MEDs: acetaminophen  **OR** acetaminophen , bisacodyl , hydrALAZINE , HYDROmorphone  (DILAUDID ) injection, ipratropium, LORazepam  **OR** LORazepam , ondansetron  **OR** ondansetron  (ZOFRAN ) IV, oxyCODONE, senna-docusate, sodium phosphate, traZODone   Past Medical History:  Diagnosis Date   Alcoholic dementia (HCC)    Anemia    ETOH abuse    Gastritis and duodenitis    Hypertension    Pulmonary nodule    Seizures (HCC)    Tobacco abuse     Past Surgical History:  Procedure Laterality Date   ANKLE FRACTURE  SURGERY     APPENDECTOMY     BIOPSY  01/08/2019   Procedure: BIOPSY;  Surgeon: Harvey Margo CROME, MD;  [facility]: [facility];  Service: Endoscopy;;  gastric   BIOPSY  11/22/2021   Procedure: BIOPSY;  Surgeon: Cindie Carlin POUR, DO;  Location: [facility];  Service: Endoscopy;;   COLONOSCOPY  01/2015   Dr. Tobie: two 2-65mm rectal polyps removed, hyperplastic   COLONOSCOPY  2007   Dr. Tobie: three adenomas removed measuring 4, 10, 15mm. diverticulosis   COLONOSCOPY WITH PROPOFOL  N/A 01/08/2019   Dr. Harvey: Severe diverticulosis with fixed rectosigmoid region, rectal bleeding likely due to internal hemorrhoids.  Recommended repeat colonoscopy at [facility] because examination was incomplete due to fixed rectosigmoid colon (patient never followed through).   COLONOSCOPY WITH PROPOFOL  N/A 11/22/2021   Surgeon: Cindie Carlin K, DO;  nonbleeding internal hemorrhoids, diverticulosis in the sigmoid, descending, transverse colon, 4 mm tubular adenoma in the descending colon removed.  Recommended 5-year surveillance.   ESOPHAGOGASTRODUODENOSCOPY (EGD) WITH PROPOFOL  N/A 01/08/2019   Dr. Harvey: Low-grade narrowing Schatzki ring at the GE junction, medium sized hiatal hernia, erosive gastritis/duodenitis in the setting of NSAID and alcohol use.  Gastric biopsy showed gastropathy but no H. pylori   ESOPHAGOGASTRODUODENOSCOPY (EGD) WITH PROPOFOL  N/A 11/22/2021   Surgeon: Cindie Carlin POUR, DO;   2 cm hiatal hernia, mild Schatzki's ring, gastritis biopsied, duodenitis.  Pathology with mild chronic gastritis, negative for H. pylori.   intestininal blockage  1970   POLYPECTOMY  11/22/2021   Procedure: POLYPECTOMY;  Surgeon: Cindie Carlin POUR, DO;  Location: [facility];  Service: Endoscopy;;     reports that he has been smoking cigarettes. He has a 12.5 pack-year smoking history. He has never used smokeless tobacco. He reports current alcohol use of about 4.0 standard drinks of alcohol per week. He  reports that he does not use drugs.   Family History  Problem Relation Age of Onset   Hypertension Mother    Stroke Mother    Breast cancer Sister    Breast cancer Sister    Fibromyalgia Daughter    Lupus Daughter    Anxiety disorder Daughter    Post-traumatic stress disorder Daughter    Asthma Daughter    Colon cancer Neg Hx    Seizures Neg Hx     Physical Exam:   Vitals:   06/06/24 1345 06/06/24 1451 06/06/24 1457 06/06/24 1500  BP: 109/70  109/70 132/81  Pulse: 88  88 (!) 105  Resp: (!) 25   (!) 25  Temp:  97.8 F (36.6 C)    TempSrc:  Oral    SpO2: 98%   98%  Weight:      Height:       Constitutional: NAD, calm, comfortable Eyes: PERRL, lids and conjunctivae normal ENMT: Mucous membranes are moist. Posterior pharynx clear of any exudate or lesions.Normal  dentition.  Neck: normal, supple, no masses, no thyromegaly Respiratory: clear to auscultation bilaterally, no wheezing, no crackles. Normal respiratory effort. No accessory muscle use.  Cardiovascular: Regular rate and rhythm, no murmurs / rubs / gallops. No extremity edema. 2+ pedal pulses. No carotid bruits.  Abdomen: no tenderness, no masses palpated. No hepatosplenomegaly. Bowel sounds positive.  Musculoskeletal: no clubbing / cyanosis. No joint deformity upper and lower extremities. Good ROM, no contractures. Normal muscle tone.  Neurologic: CN II-XII grossly intact. Sensation intact, DTR normal. Strength 5/5 in all 4.  Psychiatric: Normal judgment and insight. Alert and oriented x 3. Normal mood.  Skin: no rashes, lesions, ulcers. No induration   Labs on admission:    I have personally reviewed following labs and imaging studies  CBC: Recent Labs  Lab 06/06/24 1154  WBC 3.9*  NEUTROABS 2.2  HGB 12.5*  HCT 36.9*  MCV 92.0  PLT 119*   Basic Metabolic Panel: Recent Labs  Lab 06/06/24 1154 06/06/24 1539  NA 135  --   K 3.8  --   CL 89*  --   CO2 22  --   GLUCOSE 119*  --   BUN 15  --    CREATININE 1.26*  --   CALCIUM 9.7  --   MG  --  1.2*  PHOS  --  1.8*   GFR: Estimated Creatinine Clearance: 46.5 mL/min (A) (by C-G formula based on SCr of 1.26 mg/dL (H)). Liver Function Tests: Recent Labs  Lab 06/06/24 1154  AST 385*  ALT 70*  ALKPHOS 41  BILITOT 0.5  PROT 7.1  ALBUMIN 4.4   Recent Labs  Lab 06/06/24 1147  GLUCAP 122*    Urine analysis:    Component Value Date/Time   COLORURINE AMBER (A) 10/24/2023 2105   APPEARANCEUR HAZY (A) 10/24/2023 2105   LABSPEC 1.025 10/24/2023 2105   PHURINE 5.0 10/24/2023 2105   GLUCOSEU NEGATIVE 10/24/2023 2105   HGBUR NEGATIVE 10/24/2023 2105   BILIRUBINUR SMALL (A) 10/24/2023 2105   KETONESUR 20 (A) 10/24/2023 2105   PROTEINUR 100 (A) 10/24/2023 2105   NITRITE NEGATIVE 10/24/2023 2105   LEUKOCYTESUR NEGATIVE 10/24/2023 2105    Last A1C:  Lab Results  Component Value Date   HGBA1C 5.7 (H) 11/23/2017     Radiologic Exams on Admission:   CT Head Wo Contrast Result Date: 06/06/2024 CLINICAL DATA:  Worsening headache.  Weakness.  History of seizures. EXAM: CT HEAD WITHOUT CONTRAST TECHNIQUE: Contiguous axial images were obtained from the base of the skull through the vertex without intravenous contrast. RADIATION DOSE REDUCTION: This exam was performed according to the departmental dose-optimization program which includes automated exposure control, adjustment of the mA and/or kV according to patient size and/or use of iterative reconstruction technique. COMPARISON:  10/24/2023 FINDINGS: Brain: Examination demonstrates mild age related atrophic change and minimal chronic ischemic microvascular disease which is stable. There is no mass, mass effect, shift of midline structures or acute hemorrhage. No evidence of acute infarction. Vascular: No hyperdense vessel or unexpected calcification. Skull: Normal. Negative for fracture or focal lesion. Sinuses/Orbits: No acute finding. Other: None. IMPRESSION: 1. No acute findings.  2. Mild age related atrophic change and minimal chronic ischemic microvascular disease. Electronically Signed   By: Toribio Agreste M.D.   On: 06/06/2024 13:46   DG Chest Port 1 View Result Date: 06/06/2024 CLINICAL DATA:  Weakness.  Pain. EXAM: PORTABLE CHEST 1 VIEW COMPARISON:  10/24/2023. FINDINGS: The heart size and mediastinal contours are within normal limits.  Hyperinflation. No focal consolidation, pleural effusion, or pneumothorax. Remote right posterior rib fractures. No acute osseous abnormality identified. IMPRESSION: 1. No acute findings in the chest. 2. Hyperinflation. Electronically Signed   By: Harrietta Sherry M.D.   On: 06/06/2024 12:28    EKG:   Independently reviewed.  Orders placed or performed during the hospital encounter of 06/06/24   EKG 12-Lead   EKG 12-Lead   EKG 12-Lead   ---------------------------------------------------------------------------------------------------------------------------------------    Assessment / Plan:   Principal Problem:   Acute metabolic encephalopathy Active Problems:   Alcohol abuse   Adult failure to thrive   Seizure disorder (HCC)   Hypotension   AKI (acute kidney injury)   Hyperlipidemia   COPD (chronic obstructive pulmonary disease) (HCC)   Tobacco abuse   Noncompliance with medication regimen   Transaminitis   Assessment and Plan: * Acute metabolic encephalopathy Brief altered mental status -possibly transient metabolic encephalopathy -  - Ruling out seizure activities, syncope, currently no signs of alcohol withdrawals - CT head negative - Continue with neurochecks, monitoring closely -Likely symptoms has been exacerbated by hypertension Obtaining orthostatic hypotension BPs, s/p 3 L IV fluid -Withholding BP meds, continue Keppra  - Obtaining EEG,   Altered mental status - Continue to monitor closely, ruling out seizures, seizure induced due to alcohol withdrawal -Signs of withdrawal, last drink last  night -Continue with neurochecks -Initiating CIWA protocols -Seizure precautions, EEG -CT Head reviewed, chronic changes, atrophy, no acute intracranial abnormalities  Hypotension - Hypotensive upon arrival blood pressure 79/62 --Likely due to dehydration, poor nutrition, poor p.o. intake on 2 BP meds -S/p 3 L of IV fluid normal saline, blood pressure currently 109/70 -Monitoring closely, continue maintenance fluids - Holding home blood pressure medication of Norvasc , hydralazine   Seizure disorder (HCC) - History of seizure disorder likely due to alcohol -Patient reports he is compliant with Keppra  - Previously recorded noncompliant with medications -Continue home medication of Keppra  1000 mg twice daily -As needed Ativan  for seizure. -Seizure precautions  Adult failure to thrive Body mass index is 18.32 kg/m. Consulting dietitian-adding dietary supplements  Alcohol abuse History of alcohol abuse particularly vodka - Monitoring for withdrawals, -CIWA protocols Ativan , along with thiamine  and folate  AKI (acute kidney injury) - Likely due to dehydration, continue IV fluids -Monitoring kidney functions closely  COPD (chronic obstructive pulmonary disease) (HCC) - No signs of exacerbation, or hypoxia, as needed supplemental oxygen , DuoNebs  Hyperlipidemia - Holding hepatotoxins, including Statins due to elevated LFTs  Transaminitis - Elevated liver exam-AST/ALT, likely due to chronic alcohol abuse -Obtaining otitis panel - Continuing IV fluids, monitoring closely   Tobacco abuse - Discussed and recommended strongly for smoking cessation  - NicoDerm patch provided    Consults called:  None -------------------------------------------------------------------------------------------------------------------------------------------- DVT prophylaxis:  heparin  injection 5,000 Units Start: 06/06/24 2200 SCDs Start: 06/06/24 1442   Code Status:   Code Status: Full  Code   Admission status: Patient will be admitted as Observation, with a greater than 2 midnight length of stay. Level of care: Telemetry   Family Communication:  none at bedside  (The above findings and plan of care has been discussed with patient in detail, the patient expressed understanding and agreement of above plan)  --------------------------------------------------------------------------------------------------------------------------------------------------  Disposition Plan:  Anticipated 1-2 days Status is: Observation The patient remains OBS appropriate and will d/c before 2 midnights.     ----------------------------------------------------------------------------------------------------------------------------------------------------  Time spent:  78  Min.  Was spent seeing and evaluating the patient, reviewing all medical records, drawn plan  of care.  SIGNED: Adriana DELENA Grams, MD, FHM. FAAFP. [facility], Pager  (Please use [email or web address] to page/ or secure chat through epic) If 7PM-7AM, please contact night-coverage [email or web address],  06/06/2024, 4:25 PM

## 2024-06-06 NOTE — Assessment & Plan Note (Addendum)
-   Holding hepatotoxins, including Statins due to elevated LFTs

## 2024-06-06 NOTE — Assessment & Plan Note (Signed)
-   Continue to monitor closely, ruling out seizures, seizure induced due to alcohol withdrawal -Signs of withdrawal, last drink last night -Continue with neurochecks -Initiating CIWA protocols -Seizure precautions, EEG -CT Head reviewed, chronic changes, atrophy, no acute intracranial abnormalities

## 2024-06-06 NOTE — Assessment & Plan Note (Signed)
-   No signs of exacerbation, or hypoxia, as needed supplemental oxygen , DuoNebs

## 2024-06-06 NOTE — Assessment & Plan Note (Signed)
 Body mass index is 18.32 kg/m. Consulting dietitian-adding dietary supplements

## 2024-06-06 NOTE — Assessment & Plan Note (Addendum)
-   Hypotensive upon arrival blood pressure 79/62 --Likely due to dehydration, poor nutrition, poor p.o. intake on 2 BP meds -S/p 3 L of IV fluid normal saline, blood pressure currently 109/70 -Monitoring closely, continue maintenance fluids - Holding home blood pressure medication of Norvasc , hydralazine

## 2024-06-06 NOTE — ED Notes (Signed)
 Bladder scan showed 9mL. Started another NS bolus.

## 2024-06-06 NOTE — Hospital Course (Addendum)
 Scott Jackson is a 73 year old male with extensive history of alcohol (vodka) abuse, tobacco abuse, seizure disorder in the setting of alcohol use, depression, COPD, HTN, failure to thrive presenting with a near syncopal episode.  The patient states that he was getting ready to go see his primary provider.  He was making his way out the door to go with his daughter when he had generalized weakness and dizziness.  He nearly fell, but was able to catch himself on the door frame.  He denied syncope/loss of consciousness.  However he did endorse some gait instability.  He denied any chest pain, shortness breath, palpitations.  He denied any nausea, vomiting or direct abdominal pain.  He denies any new medications.  He denied any focal extremity weakness or dysesthesias.  The patient states that he continues to drink half pint of vodka on a daily basis.  His last drink was on the evening of 06/05/2024.  He continues to smoke tobacco.  He denies illicit drugs.  He denies any new medications.  In the ED, there was concern there was some confusion.  The patient was afebrile and hemodynamically stable.  WBC 3.9, hemoglobin 12.5, platelets 119.  Sodium 135, potassium 3.8, bicarbonate 22, serum creatinine 1.26.  AST 35, ALT 70, alk phosphatase 41, total bilirubin 0.5.  Troponin neg x 2.  UA was negative for pyuria.  UDS was negative.  Alcohol level was negative.  CT brain was negative for acute findings.  Chest x-ray showed hyperinflation.  The patient was admitted for further evaluation and treatment of encephalopathy and generalized weakness.

## 2024-06-07 ENCOUNTER — Observation Stay (HOSPITAL_COMMUNITY)

## 2024-06-07 ENCOUNTER — Other Ambulatory Visit (HOSPITAL_COMMUNITY): Payer: Self-pay | Admitting: *Deleted

## 2024-06-07 DIAGNOSIS — R55 Syncope and collapse: Secondary | ICD-10-CM

## 2024-06-07 DIAGNOSIS — G9341 Metabolic encephalopathy: Secondary | ICD-10-CM | POA: Diagnosis not present

## 2024-06-07 DIAGNOSIS — R7401 Elevation of levels of liver transaminase levels: Secondary | ICD-10-CM

## 2024-06-07 DIAGNOSIS — R627 Adult failure to thrive: Secondary | ICD-10-CM | POA: Diagnosis not present

## 2024-06-07 DIAGNOSIS — R569 Unspecified convulsions: Secondary | ICD-10-CM | POA: Diagnosis not present

## 2024-06-07 DIAGNOSIS — F1010 Alcohol abuse, uncomplicated: Secondary | ICD-10-CM | POA: Diagnosis not present

## 2024-06-07 DIAGNOSIS — E43 Unspecified severe protein-calorie malnutrition: Secondary | ICD-10-CM | POA: Insufficient documentation

## 2024-06-07 LAB — CBC
HCT: 30.7 % — ABNORMAL LOW (ref 39.0–52.0)
Hemoglobin: 10.3 g/dL — ABNORMAL LOW (ref 13.0–17.0)
MCH: 30.8 pg (ref 26.0–34.0)
MCHC: 33.6 g/dL (ref 30.0–36.0)
MCV: 91.9 fL (ref 80.0–100.0)
Platelets: 99 K/uL — ABNORMAL LOW (ref 150–400)
RBC: 3.34 MIL/uL — ABNORMAL LOW (ref 4.22–5.81)
RDW: 15.3 % (ref 11.5–15.5)
WBC: 2.8 K/uL — ABNORMAL LOW (ref 4.0–10.5)
nRBC: 0 % (ref 0.0–0.2)

## 2024-06-07 LAB — FOLATE: Folate: 11.6 ng/mL (ref >5.9)

## 2024-06-07 LAB — COMPREHENSIVE METABOLIC PANEL WITH GFR
ALT: 39 U/L (ref 0–44)
AST: 147 U/L — ABNORMAL HIGH (ref 15–41)
Albumin: 3.6 g/dL (ref 3.5–5.0)
Alkaline Phosphatase: 34 U/L — ABNORMAL LOW (ref 38–126)
Anion gap: 12 (ref 5–15)
BUN: 13 mg/dL (ref 8–23)
CO2: 26 mmol/L (ref 22–32)
Calcium: 8.5 mg/dL — ABNORMAL LOW (ref 8.9–10.3)
Chloride: 99 mmol/L (ref 98–111)
Creatinine, Ser: 0.85 mg/dL (ref 0.61–1.24)
GFR, Estimated: >60 mL/min (ref >60)
Glucose, Bld: 89 mg/dL (ref 70–99)
Potassium: 3.6 mmol/L (ref 3.5–5.1)
Sodium: 137 mmol/L (ref 135–145)
Total Bilirubin: 0.3 mg/dL (ref 0.0–1.2)
Total Protein: 5.4 g/dL — ABNORMAL LOW (ref 6.5–8.1)

## 2024-06-07 LAB — ECHOCARDIOGRAM COMPLETE
Area-P 1/2: 3.27 cm2
Height: 73 in
S' Lateral: 2.3 cm
Weight: 2088.2 [oz_av]

## 2024-06-07 LAB — CK: Total CK: 195 U/L (ref 49–397)

## 2024-06-07 LAB — TSH: TSH: 0.959 u[IU]/mL (ref 0.350–4.500)

## 2024-06-07 LAB — GLUCOSE, CAPILLARY: Glucose-Capillary: 81 mg/dL (ref 70–99)

## 2024-06-07 LAB — VITAMIN B12: Vitamin B-12: 1416 pg/mL — ABNORMAL HIGH (ref 180–914)

## 2024-06-07 LAB — PROLACTIN: Prolactin: 8.3 ng/mL (ref 3.6–25.2)

## 2024-06-07 LAB — T4, FREE: Free T4: 0.88 ng/dL (ref 0.61–1.12)

## 2024-06-07 LAB — AMMONIA: Ammonia: 14 umol/L (ref 9–35)

## 2024-06-07 MED ORDER — ENSURE PLUS HIGH PROTEIN PO LIQD: 237.0000 mL | Freq: Three times a day (TID) | ORAL | Status: AC

## 2024-06-07 MED ORDER — ENSURE PLUS HIGH PROTEIN PO LIQD
237.0000 mL | Freq: Three times a day (TID) | ORAL | Status: DC
  Administered 2024-06-07 – 2024-06-08 (×3): 237 mL via ORAL

## 2024-06-07 MED ORDER — POTASSIUM PHOSPHATES 15 MMOLE/5ML IV SOLN
30.0000 mmol | Freq: Once | INTRAVENOUS | Status: AC
  Administered 2024-06-07: 30 mmol via INTRAVENOUS
  Filled 2024-06-07: qty 10

## 2024-06-07 MED ORDER — THIAMINE HCL 100 MG/ML IJ SOLN
500.0000 mg | Freq: Three times a day (TID) | INTRAVENOUS | Status: DC
  Administered 2024-06-07 – 2024-06-08 (×3): 500 mg via INTRAVENOUS
  Filled 2024-06-07 (×6): qty 5

## 2024-06-07 MED ORDER — FOLIC ACID 1 MG PO TABS: 1.0000 mg | ORAL_TABLET | Freq: Every day | ORAL | Status: AC

## 2024-06-07 MED ORDER — LACTATED RINGERS IV SOLN: INTRAVENOUS | Status: AC

## 2024-06-07 MED ORDER — MAGNESIUM SULFATE 2 GM/50ML IV SOLN
2.0000 g | Freq: Once | INTRAVENOUS | Status: AC
  Administered 2024-06-07: 2 g via INTRAVENOUS
  Filled 2024-06-07: qty 50

## 2024-06-07 NOTE — Procedures (Signed)
 Patient Name: Nester Bachus  MRN: [id number]  Epilepsy Attending: Arlin MALVA Krebs  Referring Physician/Provider: Willette Adriana LABOR, MD  Date: 06/07/2024 Duration: 26.49 mins  Patient history: 73 year old male with syncope.  EEG to admit for seizure.  Level of alertness: Awake  AEDs during EEG study: LEV  Technical aspects: This EEG study was done with scalp electrodes positioned according to the 10-20 International system of electrode placement. Electrical activity was reviewed with band pass filter of 1-70Hz , sensitivity of 7 uV/mm, display speed of 44mm/sec with a 60Hz  notched filter applied as appropriate. EEG data were recorded continuously and digitally stored.  Video monitoring was available and reviewed as appropriate.  Description: The posterior dominant rhythm consists of 8-9 Hz activity of moderate voltage (25-35 uV) seen predominantly in posterior head regions, symmetric and reactive to eye opening and eye closing. Hyperventilation and photic stimulation were not performed.     IMPRESSION: This study is within normal limits. No seizures or epileptiform discharges were seen throughout the recording.  A normal interictal EEG does not exclude the diagnosis of epilepsy.   Alquan Morrish O Trezure Cronk

## 2024-06-07 NOTE — Plan of Care (Addendum)
  Problem: Acute Rehab PT Goals(only PT should resolve) Goal: Pt Will Go Sit To Supine/Side 06/07/2024 1110 by Maeley Matton, Student-PT Outcome: Progressing Flowsheets (Taken 06/07/2024 1110) Pt will go Sit to Supine/Side: Independently   Problem: Acute Rehab PT Goals(only PT should resolve) Goal: Patient Will Perform Sitting Balance 06/07/2024 1110 by Tanise Russman, Student-PT Outcome: Progressing Flowsheets (Taken 06/07/2024 1110) Patient will perform sitting balance: Independently   Problem: Acute Rehab PT Goals(only PT should resolve) Goal: Patient Will Transfer Sit To/From Stand 06/07/2024 1110 by Linus Weckerly, Student-PT Outcome: Progressing   Problem: Acute Rehab PT Goals(only PT should resolve) Goal: Pt Will Transfer Bed To Chair/Chair To Bed 06/07/2024 1110 by Lonni Dirden, Student-PT Outcome: Progressing   Problem: Acute Rehab PT Goals(only PT should resolve) Goal: Pt Will Perform Standing Balance Or Pre-Gait 06/07/2024 1110 by Lynia Landry, Student-PT Outcome: Progressing    Problem: Acute Rehab PT Goals(only PT should resolve) Goal: Pt Will Ambulate 06/07/2024 1110 by Dalina Samara, Student-PT Outcome: Progressing Flowsheets (Taken 06/07/2024 1110) Pt will Ambulate:  > 125 feet  Independently   Problem: Acute Rehab PT Goals(only PT should resolve) Goal: Pt Will Go Up/Down Stairs 06/07/2024 1110 by Anddy Wingert, Student-PT Outcome: Progressing Flowsheets (Taken 06/07/2024 1110) Pt will Go Up / Down Stairs:  3-5 stairs  Independently  with modified independence    Katesha Eichel, SPT

## 2024-06-07 NOTE — Discharge Summary (Signed)
 Physician Discharge Summary   Patient: Scott Jackson MRN: [id number] DOB: 07/12/1951  Admit date:     06/06/2024  Discharge date: 06/08/2024  Discharge Physician: Alm Roey Coopman   PCP: Rolinda Millman, MD   Recommendations at discharge:  { Please follow up with primary care provider within 1-2 weeks  Please repeat BMP and CBC in one week    Hospital Course: Scott Jackson is a 73 year old male with extensive history of alcohol (vodka) abuse, tobacco abuse, seizure disorder in the setting of alcohol use, depression, COPD, HTN, failure to thrive presenting with a near syncopal episode.  The patient states that he was getting ready to go see his primary provider.  He was making his way out the door to go with his daughter when he had generalized weakness and dizziness.  He nearly fell, but was able to catch himself on the door frame.  He denied syncope/loss of consciousness.  However he did endorse some gait instability.  He denied any chest pain, shortness breath, palpitations.  He denied any nausea, vomiting or direct abdominal pain.  He denies any new medications.  He denied any focal extremity weakness or dysesthesias.  The patient states that he continues to drink half pint of vodka on a daily basis.  His last drink was on the evening of 06/05/2024.  He continues to smoke tobacco.  He denies illicit drugs.  He denies any new medications.  In the ED, there was concern there was some confusion.  The patient was afebrile and hemodynamically stable.  WBC 3.9, hemoglobin 12.5, platelets 119.  Sodium 135, potassium 3.8, bicarbonate 22, serum creatinine 1.26.  AST 35, ALT 70, alk phosphatase 41, total bilirubin 0.5.  Troponin neg x 2.  UA was negative for pyuria.  UDS was negative.  Alcohol level was negative.  CT brain was negative for acute findings.  Chest x-ray showed hyperinflation.  The patient was admitted for further evaluation and treatment of encephalopathy and generalized  weakness.   Assessment and Plan: Acute metabolic encephalopathy - Multifactorial including dehydration, possible seizure, hypotension, Wernicke's encephalopathy - UA negative for pyuria - CT brain negative - Mental status improving but still slow to respond - B12--1416 - Folic acid --11.6 - TSH--0.959 - ammonia--14 - CK 195 - Started high-dose thiamine  empirically   Hypotension/near syncope - Patient presented with blood pressure 79/62 - Secondary to dysautonomia and volume depletion - He is afebrile and improved with IV fluids - Given IV fluids - Echo-- EF60-65%, no WMA, G1DD, normal RVF - Check orthostatics--neg   Failure to thrive - PT evaluation>>HHPT - Workup as above   Alcohol abuse - Alcohol withdrawal protocol - no signs of withdrawal   Seizure disorder - Continue Keppra  - Patient states that he has not been completely compliant - EEG--no seizure   Transaminasemia - Secondary to alcohol use - Viral hepatitis panel negative - No abdominal pain presently - tolerating diet - overall stable and trending down  Essential Hypertension -amlodipine  and hydralazine  held during hospitalization due to initial hypotension - will not restart hydralazine  -restart amlodipine  after dc   Hypomagnesemia/hypophosphatemia -Repleted   COPD/tobacco abuse - Tobacco cessation discussed - Stable on room air   Pancytopenia - Secondary to chronic alcohol use - Workup as above         Consultants: none Procedures performed: none  Disposition: Home Diet recommendation:  Regular diet DISCHARGE MEDICATION: Allergies as of 06/08/2024   No Known Allergies      Medication List  STOP taking these medications    hydrALAZINE  50 MG tablet Commonly known as: APRESOLINE        TAKE these medications    amLODipine  10 MG tablet Commonly known as: NORVASC  Take 1 tablet (10 mg total) by mouth daily. For BP   B-12 1000 MCG Caps Take 1 capsule by mouth daily.    feeding supplement Liqd Take 237 mLs by mouth 3 (three) times daily between meals.   folic acid  1 MG tablet Commonly known as: FOLVITE  Take 1 tablet (1 mg total) by mouth daily.   levETIRAcetam  1000 MG tablet Commonly known as: KEPPRA  Take 1 tablet (1,000 mg total) by mouth 2 (two) times daily.   magnesium  oxide 400 (240 Mg) MG tablet Commonly known as: MAG-OX Take 1 tablet by mouth 2 (two) times daily.   mirtazapine  15 MG tablet Commonly known as: Remeron  Take 1 tablet (15 mg total) by mouth at bedtime.   tamsulosin  0.4 MG Caps capsule Commonly known as: FLOMAX  Take 1 capsule (0.4 mg total) by mouth daily after supper.        Contact information for follow-up providers     [facility] ( Outpatient Treatment Plan). Call.   Contact information: [address] [place], [place]  Walk in - [phone]- Fri) 5:30am-11AM Schedule appointment [phone]             Contact information for after-discharge care     Home Medical Care     [facility]) .   Service: Home Health Services Contact information: [address] [place]  [address] [phone]                    Discharge Exam: Fredricka Weights   06/06/24 1158 06/07/24 0456 06/08/24 0500  Weight: 63 kg 59.2 kg 58.6 kg   HEENT:  [place]/AT, No thrush, no icterus CV:  RRR, no rub, no S3, no S4 Lung:  diminished BS, bibasilar crackles.  No wheeze Abd:  soft/+BS, NT Ext:  No edema, no lymphangitis, no synovitis, no rash   Condition at discharge: stable  The results of significant diagnostics from this hospitalization (including imaging, microbiology, ancillary and laboratory) are listed below for reference.   Imaging Studies: EEG adult Result Date: 06/07/2024 Shelton Arlin KIDD, MD     06/07/2024  4:35 PM Patient Name: Kejon Feild MRN: [id number] Epilepsy Attending: Arlin KIDD Shelton Referring Physician/Provider: Willette Adriana LABOR, MD Date: 06/07/2024  Duration: 26.49 mins Patient history: 73 year old male with syncope.  EEG to admit for seizure. Level of alertness: Awake AEDs during EEG study: LEV Technical aspects: This EEG study was done with scalp electrodes positioned according to the 10-20 International system of electrode placement. Electrical activity was reviewed with band pass filter of 1-70Hz , sensitivity of 7 uV/mm, display speed of 38mm/sec with a 60Hz  notched filter applied as appropriate. EEG data were recorded continuously and digitally stored.  Video monitoring was available and reviewed as appropriate. Description: The posterior dominant rhythm consists of 8-9 Hz activity of moderate voltage (25-35 uV) seen predominantly in posterior head regions, symmetric and reactive to eye opening and eye closing. Hyperventilation and photic stimulation were not performed.   IMPRESSION: This study is within normal limits. No seizures or epileptiform discharges were seen throughout the recording. A normal interictal EEG does not exclude the diagnosis of epilepsy. Arlin KIDD Shelton   ECHOCARDIOGRAM COMPLETE Result Date: 06/07/2024    ECHOCARDIOGRAM REPORT   Patient Name:  Lynwood Sauers V Date of Exam: 06/07/2024 Medical Rec #:  [id number]     Height:       73.0 in Accession #:    [id number]    Weight:       130.5 lb Date of Birth:  1950-09-29    BSA:          1.795 m Patient Age:    73 years      BP:           133/87 mmHg Patient Gender: M             HR:           70 bpm. Exam Location:  [facility] Procedure: 2D Echo, Cardiac Doppler and Color Doppler (Both Spectral and Color            Flow Doppler were utilized during procedure). Indications:    Syncope R55  History:        Patient has prior history of Echocardiogram examinations. Risk                 Factors:Hypertension, Dyslipidemia and Current Smoker. Hx of                 ETOH.  Sonographer:    Aida Pizza RCS Referring Phys: [phone] Orvetta Danielski IMPRESSIONS  1. Left ventricular ejection fraction, by  estimation, is 60 to 65%. The left ventricle has normal function. The left ventricle has no regional wall motion abnormalities. Left ventricular diastolic parameters are consistent with Grade I diastolic dysfunction (impaired relaxation).  2. Right ventricular systolic function is normal. The right ventricular size is normal. Tricuspid regurgitation signal is inadequate for assessing PA pressure.  3. The mitral valve is normal in structure. No evidence of mitral valve regurgitation. No evidence of mitral stenosis.  4. The aortic valve is tricuspid. Aortic valve regurgitation is not visualized. No aortic stenosis is present.  5. The inferior vena cava is normal in size with greater than 50% respiratory variability, suggesting right atrial pressure of 3 mmHg. Comparison(s): No significant change from prior study. FINDINGS  Left Ventricle: Left ventricular ejection fraction, by estimation, is 60 to 65%. The left ventricle has normal function. The left ventricle has no regional wall motion abnormalities. Strain was performed and the global longitudinal strain is indeterminate. The left ventricular internal cavity size was normal in size. There is no left ventricular hypertrophy. Left ventricular diastolic parameters are consistent with Grade I diastolic dysfunction (impaired relaxation). Normal left ventricular filling pressure. Right Ventricle: The right ventricular size is normal. No increase in right ventricular wall thickness. Right ventricular systolic function is normal. Tricuspid regurgitation signal is inadequate for assessing PA pressure. Left Atrium: Left atrial size was normal in size. Right Atrium: Right atrial size was normal in size. Pericardium: There is no evidence of pericardial effusion. Mitral Valve: The mitral valve is normal in structure. No evidence of mitral valve regurgitation. No evidence of mitral valve stenosis. Tricuspid Valve: The tricuspid valve is normal in structure. Tricuspid valve  regurgitation is trivial. No evidence of tricuspid stenosis. Aortic Valve: The aortic valve is tricuspid. Aortic valve regurgitation is not visualized. No aortic stenosis is present. Pulmonic Valve: The pulmonic valve was normal in structure. Pulmonic valve regurgitation is not visualized. No evidence of pulmonic stenosis. Aorta: The aortic root is normal in size and structure. Venous: The inferior vena cava is normal in size with greater than 50% respiratory variability, suggesting right atrial pressure of 3 mmHg. IAS/Shunts:  No atrial level shunt detected by color flow Doppler. Additional Comments: 3D was performed not requiring image post processing on an independent workstation and was indeterminate.  LEFT VENTRICLE PLAX 2D LVIDd:         4.40 cm   Diastology LVIDs:         2.30 cm   LV e' medial:    7.83 cm/s LV PW:         0.90 cm   LV E/e' medial:  9.3 LV IVS:        0.90 cm   LV e' lateral:   10.90 cm/s LVOT diam:     1.90 cm   LV E/e' lateral: 6.7 LV SV:         41 LV SV Index:   23 LVOT Area:     2.84 cm  RIGHT VENTRICLE RV S prime:     12.40 cm/s TAPSE (M-mode): 1.8 cm LEFT ATRIUM             Index        RIGHT ATRIUM           Index LA diam:        2.40 cm 1.34 cm/m   RA Area:     14.30 cm LA Vol (A2C):   24.7 ml 13.76 ml/m  RA Volume:   34.00 ml  18.95 ml/m LA Vol (A4C):   36.5 ml 20.34 ml/m LA Biplane Vol: 30.6 ml 17.05 ml/m  AORTIC VALVE LVOT Vmax:   74.50 cm/s LVOT Vmean:  48.100 cm/s LVOT VTI:    0.143 m  AORTA Ao Root diam: 3.60 cm MITRAL VALVE MV Area (PHT): 3.27 cm    SHUNTS MV Decel Time: 232 msec    Systemic VTI:  0.14 m MV E velocity: 73.20 cm/s  Systemic Diam: 1.90 cm MV A velocity: 86.00 cm/s MV E/A ratio:  0.85 Vishnu Priya Mallipeddi Electronically signed by Diannah Late Mallipeddi Signature Date/Time: 06/07/2024/3:14:15 PM    Final    US  Carotid Bilateral Result Date: 06/07/2024 CLINICAL DATA:  Syncope EXAM: BILATERAL CAROTID DUPLEX ULTRASOUND TECHNIQUE: Elnor scale imaging,  color Doppler and duplex ultrasound were performed of bilateral carotid and vertebral arteries in the neck. COMPARISON:  None Available. FINDINGS: Criteria: Quantification of carotid stenosis is based on velocity parameters that correlate the residual internal carotid diameter with NASCET-based stenosis levels, using the diameter of the distal internal carotid lumen as the denominator for stenosis measurement. The following velocity measurements were obtained: RIGHT ICA: 59 cm/sec CCA: 62 cm/sec SYSTOLIC ICA/CCA RATIO:  0.9 ECA: 64 cm/sec LEFT ICA: 64 cm/sec CCA: 51 cm/sec SYSTOLIC ICA/CCA RATIO:  1.3 ECA: 40 cm/sec RIGHT CAROTID ARTERY: Patent. RIGHT VERTEBRAL ARTERY:  Antegrade. LEFT CAROTID ARTERY:  Patent. LEFT VERTEBRAL ARTERY:  Antegrade. IMPRESSION: 1. Right carotid system: Within normal limits. No evidence of flow-limiting stenosis. 2. Left carotid system: Within normal limits. No evidence of flow-limiting stenosis. Electronically Signed   By: Maude Naegeli M.D.   On: 06/07/2024 09:24   CT Head Wo Contrast Result Date: 06/06/2024 CLINICAL DATA:  Worsening headache.  Weakness.  History of seizures. EXAM: CT HEAD WITHOUT CONTRAST TECHNIQUE: Contiguous axial images were obtained from the base of the skull through the vertex without intravenous contrast. RADIATION DOSE REDUCTION: This exam was performed according to the departmental dose-optimization program which includes automated exposure control, adjustment of the mA and/or kV according to patient size and/or use of iterative reconstruction technique. COMPARISON:  10/24/2023 FINDINGS: Brain: Examination demonstrates  mild age related atrophic change and minimal chronic ischemic microvascular disease which is stable. There is no mass, mass effect, shift of midline structures or acute hemorrhage. No evidence of acute infarction. Vascular: No hyperdense vessel or unexpected calcification. Skull: Normal. Negative for fracture or focal lesion. Sinuses/Orbits:  No acute finding. Other: None. IMPRESSION: 1. No acute findings. 2. Mild age related atrophic change and minimal chronic ischemic microvascular disease. Electronically Signed   By: Toribio Agreste M.D.   On: 06/06/2024 13:46   DG Chest Port 1 View Result Date: 06/06/2024 CLINICAL DATA:  Weakness.  Pain. EXAM: PORTABLE CHEST 1 VIEW COMPARISON:  10/24/2023. FINDINGS: The heart size and mediastinal contours are within normal limits. Hyperinflation. No focal consolidation, pleural effusion, or pneumothorax. Remote right posterior rib fractures. No acute osseous abnormality identified. IMPRESSION: 1. No acute findings in the chest. 2. Hyperinflation. Electronically Signed   By: Harrietta Sherry M.D.   On: 06/06/2024 12:28    Microbiology: Results for orders placed or performed during the hospital encounter of 10/24/23  Resp panel by RT-PCR (RSV, Flu A&B, Covid) Anterior Nasal Swab     Status: None   Collection Time: 10/24/23  6:26 PM   Specimen: Anterior Nasal Swab  Result Value Ref Range Status   SARS Coronavirus 2 by RT PCR NEGATIVE NEGATIVE Final    Comment: (NOTE) SARS-CoV-2 target nucleic acids are NOT DETECTED.  The SARS-CoV-2 RNA is generally detectable in upper respiratory specimens during the acute phase of infection. The lowest concentration of SARS-CoV-2 viral copies this assay can detect is 138 copies/mL. A negative result does not preclude SARS-Cov-2 infection and should not be used as the sole basis for treatment or other patient management decisions. A negative result may occur with  improper specimen collection/handling, submission of specimen other than nasopharyngeal swab, presence of viral mutation(s) within the areas targeted by this assay, and inadequate number of viral copies(<138 copies/mL). A negative result must be combined with clinical observations, patient history, and epidemiological information. The expected result is Negative.  Fact Sheet for Patients:   bloggercourse.com  Fact Sheet for Healthcare Providers:  seriousbroker.it  This test is no t yet approved or cleared by the [place]  FDA and  has been authorized for detection and/or diagnosis of SARS-CoV-2 by FDA under an Emergency Use Authorization (EUA). This EUA will remain  in effect (meaning this test can be used) for the duration of the COVID-19 declaration under Section 564(b)(1) of the Act, 21 U.S.C.section 360bbb-3(b)(1), unless the authorization is terminated  or revoked sooner.       Influenza A by PCR NEGATIVE NEGATIVE Final   Influenza B by PCR NEGATIVE NEGATIVE Final    Comment: (NOTE) The Xpert Xpress SARS-CoV-2/FLU/RSV plus assay is intended as an aid in the diagnosis of influenza from Nasopharyngeal swab specimens and should not be used as a sole basis for treatment. Nasal washings and aspirates are unacceptable for Xpert Xpress SARS-CoV-2/FLU/RSV testing.  Fact Sheet for Patients: bloggercourse.com  Fact Sheet for Healthcare Providers: seriousbroker.it  This test is not yet approved or cleared by the [place]  FDA and has been authorized for detection and/or diagnosis of SARS-CoV-2 by FDA under an Emergency Use Authorization (EUA). This EUA will remain in effect (meaning this test can be used) for the duration of the COVID-19 declaration under Section 564(b)(1) of the Act, 21 U.S.C. section 360bbb-3(b)(1), unless the authorization is terminated or revoked.     Resp Syncytial Virus by PCR NEGATIVE NEGATIVE Final  Comment: (NOTE) Fact Sheet for Patients: bloggercourse.com  Fact Sheet for Healthcare Providers: seriousbroker.it  This test is not yet approved or cleared by the [place]  FDA and has been authorized for detection and/or diagnosis of SARS-CoV-2 by FDA under an Emergency Use  Authorization (EUA). This EUA will remain in effect (meaning this test can be used) for the duration of the COVID-19 declaration under Section 564(b)(1) of the Act, 21 U.S.C. section 360bbb-3(b)(1), unless the authorization is terminated or revoked.  Performed at [facility], [address] [place], [place] [address]     Labs: CBC: Recent Labs  Lab 06/06/24 1154 06/07/24 0423 06/08/24 0426  WBC 3.9* 2.8* 2.9*  NEUTROABS 2.2  --   --   HGB 12.5* 10.3* 10.0*  HCT 36.9* 30.7* 29.7*  MCV 92.0 91.9 92.0  PLT 119* 99* 115*   Basic Metabolic Panel: Recent Labs  Lab 06/06/24 1154 06/06/24 1539 06/07/24 0423 06/08/24 0426  NA 135  --  137 138  K 3.8  --  3.6 3.6  CL 89*  --  99 101  CO2 22  --  26 28  GLUCOSE 119*  --  89 104*  BUN 15  --  13 12  CREATININE 1.26*  --  0.85 0.87  CALCIUM 9.7  --  8.5* 8.8*  MG  --  1.2*  --  1.8  PHOS  --  1.8*  --  4.3   Liver Function Tests: Recent Labs  Lab 06/06/24 1154 06/07/24 0423 06/08/24 0426  AST 385* 147* 93*  ALT 70* 39 33  ALKPHOS 41 34* 52  BILITOT 0.5 0.3 <0.2  PROT 7.1 5.4* 5.4*  ALBUMIN 4.4 3.6 3.3*   CBG: Recent Labs  Lab 06/06/24 1147 06/07/24 0741 06/08/24 0709  GLUCAP 122* 81 108*    Discharge time spent: greater than 30 minutes.  Signed: Alm Schneider, MD [facility] 06/08/2024

## 2024-06-07 NOTE — Discharge Instructions (Signed)
[facility] Stay Proper nutrition can help your body recover from illness and injury.   Foods and beverages high in protein, vitamins, and minerals help rebuild muscle loss, promote healing, & reduce fall risk.   In addition to eating healthy foods, a nutrition shake is an easy, delicious way to get the nutrition you need during and after your hospital stay  It is recommended that you continue to drink 2 bottles per day of:       Ensure Plus for at least 1 month (30 days) after your hospital stay   Tips for adding a nutrition shake into your routine: As allowed, drink one with vitamins or medications instead of water or juice Enjoy one as a tasty mid-morning or afternoon snack Drink cold or make a milkshake out of it Drink one instead of milk with cereal or snacks Use as a coffee creamer   Available at the following grocery stores and pharmacies:           * [place] [phone]            For COUPONS visit: www.ensure.com/join or http://dawson-may.com/   Suggested Substitutions Ensure Plus = Boost Plus = Carnation Breakfast Essentials = Boost Compact

## 2024-06-07 NOTE — Evaluation (Signed)
 Physical Therapy Evaluation Patient Details Name: Scott Jackson MRN: [id number] DOB: 1950/08/12 Today's Date: 06/07/2024  History of Present Illness  Scott Jackson is a 73 year old male with extensive history of alcohol (vodka) abuse, seizure disorder in the setting of alcohol use, depression, COPD, HTN,  COPD, GERD, tobacco abuse, failure to thrive... Noncompliant, once again presented to the ED with altered mental status.  Patient reports he had an appointment this morning that he was supposed to go with his daughter but he was feeling very dizzy and unsteady on his feet.  He passed out and did not have any seizure-like activities.  Reports that he does not drink heavy but he does drink alcohol specifically vodka his last drink was last night.  Reports that he is compliant with his medication including Keppra     Clinical Impression  Pt. Presented w/ general weakness in LE due to dx. Pt was able to perform bed mobility w/ supervision/ mod Independence to EOB, w/ transfer from bed to chair pt. Had good LE strength to transfer and ambulate into the hallway. Pt was able to perform transfer and ambulation w/o AD and supervision/ mod Independence w/ gait belt. Pt. Was left in chair w/ call bell and nursing staff was notified on pt. Status. Patient will benefit from continued skilled physical therapy in hospital and recommended venue below to increase strength, balance, endurance for safe ADLs and gait.         If plan is discharge home, recommend the following: Assist for transportation;A little help with walking and/or transfers   Can travel by private vehicle    Yes    Equipment Recommendations None recommended by PT  Recommendations for Other Services       Functional Status Assessment Patient has had a recent decline in their functional status and demonstrates the ability to make significant improvements in function in a reasonable and predictable amount of time.     Precautions /  Restrictions Precautions Precautions: Fall Recall of Precautions/Restrictions: Intact Restrictions Weight Bearing Restrictions Per Provider Order: No      Mobility  Bed Mobility Overal bed mobility: Independent                  Transfers Overall transfer level: Needs assistance   Transfers: Sit to/from Stand, Bed to chair/wheelchair/BSC Sit to Stand: Supervision, Modified independent (Device/Increase time)   Step pivot transfers: Supervision, Modified independent (Device/Increase time)       General transfer comment: mild labored movement; no physical assist needed.    Ambulation/Gait Ambulation/Gait assistance: Contact guard assist, Supervision Gait Distance (Feet): 100 Feet Assistive device: None Gait Pattern/deviations: Step-through pattern, Decreased step length - right, Decreased step length - left, Drifts right/left, Narrow base of support       General Gait Details: Pt. was able to ambulate w/o AD and was on room air. Pt has minor drifting when turning corners during ambulation. Pttolerated ambulation well.  Stairs            Wheelchair Mobility     Tilt Bed    Modified Rankin (Stroke Patients Only)       Balance Overall balance assessment: Mild deficits observed, not formally tested                                           Pertinent Vitals/Pain Pain Assessment Pain Assessment: No/denies pain  Home Living Family/patient expects to be discharged to:: Private residence Living Arrangements: Other relatives Available Help at Discharge: Family;Available PRN/intermittently Type of Home: House Home Access: Level entry       Home Layout: One level Home Equipment: Cane - quad      Prior Function Prior Level of Function : Needs assist;History of Falls (last six months)       Physical Assist : ADLs (physical)   ADLs (physical): IADLs Mobility Comments: Community ambulator without AD ADLs Comments: Independent  ADL's; assist IADL's.     Extremity/Trunk Assessment   Upper Extremity Assessment Upper Extremity Assessment: Defer to OT evaluation    Lower Extremity Assessment Lower Extremity Assessment: Overall WFL for tasks assessed    Cervical / Trunk Assessment Cervical / Trunk Assessment: Normal  Communication   Communication Communication: No apparent difficulties    Cognition Arousal: Alert Behavior During Therapy: WFL for tasks assessed/performed   PT - Cognitive impairments: No apparent impairments                         Following commands: Intact       Cueing Cueing Techniques: Verbal cues     General Comments      Exercises     Assessment/Plan    PT Assessment Patient needs continued PT services  PT Problem List Decreased strength;Decreased range of motion;Decreased safety awareness;Decreased activity tolerance;Decreased balance;Decreased mobility;Decreased coordination       PT Treatment Interventions DME instruction;Gait training;Stair training;Functional mobility training;Patient/family education;Therapeutic activities;Therapeutic exercise;Balance training    PT Goals (Current goals can be found in the Care Plan section)  Acute Rehab PT Goals Patient Stated Goal: pt. wants to return home PT Goal Formulation: With patient Time For Goal Achievement: 06/12/24 Potential to Achieve Goals: Good    Frequency Min 3X/week     Co-evaluation PT/OT/SLP Co-Evaluation/Treatment: Yes Reason for Co-Treatment: To address functional/ADL transfers PT goals addressed during session: Mobility/safety with mobility OT goals addressed during session: ADL's and self-care       AM-PAC PT 6 Clicks Mobility  Outcome Measure Help needed turning from your back to your side while in a flat bed without using bedrails?: None Help needed moving from lying on your back to sitting on the side of a flat bed without using bedrails?: None Help needed moving to and from a  bed to a chair (including a wheelchair)?: None Help needed standing up from a chair using your arms (e.g., wheelchair or bedside chair)?: A Little Help needed to walk in hospital room?: None Help needed climbing 3-5 steps with a railing? : A Little 6 Click Score: 22    End of Session Equipment Utilized During Treatment: Gait belt Activity Tolerance: Patient tolerated treatment well Patient left: in chair;with call bell/phone within reach Nurse Communication: Mobility status PT Visit Diagnosis: Unsteadiness on feet (R26.81);Muscle weakness (generalized) (M62.81);History of falling (Z91.81)    Time: 9198-9172 PT Time Calculation (min) (ACUTE ONLY): 26 min   Charges:   PT Evaluation $PT Eval Moderate Complexity: 1 Mod PT Treatments $Therapeutic Activity: 23-37 mins PT General Charges $$ ACUTE PT VISIT: 1 Visit        Hindy Perrault, SPT

## 2024-06-07 NOTE — Progress Notes (Addendum)
 PROGRESS NOTE  Scott Jackson FMW:[id number] DOB: Nov 27, 1950 DOA: 06/06/2024 PCP: Rolinda Millman, MD  Brief History:  Scott Jackson is a 73 year old male with extensive history of alcohol (vodka) abuse, tobacco abuse, seizure disorder in the setting of alcohol use, depression, COPD, HTN, failure to thrive presenting with a near syncopal episode.  The patient states that he was getting ready to go see his primary provider.  He was making his way out the door to go with his daughter when he had generalized weakness and dizziness.  He nearly fell, but was able to catch himself on the door frame.  He denied syncope/loss of consciousness.  However he did endorse some gait instability.  He denied any chest pain, shortness breath, palpitations.  He denied any nausea, vomiting or direct abdominal pain.  He denies any new medications.  He denied any focal extremity weakness or dysesthesias.  The patient states that he continues to drink half pint of vodka on a daily basis.  His last drink was on the evening of 06/05/2024.  He continues to smoke tobacco.  He denies illicit drugs.  He denies any new medications.  In the ED, there was concern there was some confusion.  The patient was afebrile and hemodynamically stable.  WBC 3.9, hemoglobin 12.5, platelets 119.  Sodium 135, potassium 3.8, bicarbonate 22, serum creatinine 1.26.  AST 35, ALT 70, alk phosphatase 41, total bilirubin 0.5.  Troponin neg x 2.  UA was negative for pyuria.  UDS was negative.  Alcohol level was negative.  CT brain was negative for acute findings.  Chest x-ray showed hyperinflation.  The patient was admitted for further evaluation and treatment of encephalopathy and generalized weakness.    Assessment/Plan: Acute metabolic encephalopathy - Multifactorial including dehydration, possible seizure, hypotension, Wernicke's encephalopathy - UA negative for pyuria - CT brain negative - Mental status improving but still slow to  respond - B12 - Folic acid  - TSH - ammonia - Start high-dose thiamine  empirically  Hypotension/near syncope - Patient presented with blood pressure 79/62 - Secondary to dysautonomia and volume depletion - He is afebrile and improved with IV fluids - Continue IV fluids - Echocardiogram - Check orthostatics  Failure to thrive - PT evaluation - Workup as above  Alcohol abuse - Alcohol withdrawal protocol  Seizure disorder - Continue Keppra  - Patient states that he has not been completely compliant  Transaminasemia - Secondary to alcohol use - Viral hepatitis panel negative - No abdominal pain presently  Hypomagnesemia/hypophosphatemia -Replete  COPD/tobacco abuse - Tobacco cessation discussed - Stable on room air  Pancytopenia - Secondary to chronic alcohol use - Workup as above        Family Communication: no  Family at bedside  Consultants:  none  Code Status:  FULL   DVT Prophylaxis:  SCDs   Procedures: As Listed in Progress Note Above  Antibiotics: None       Subjective: Patient denies fevers, chills, headache, chest pain, dyspnea, nausea, vomiting, diarrhea, abdominal pain, dysuria, hematuria, hematochezia, and melena.   Objective: Vitals:   06/06/24 2008 06/06/24 2310 06/07/24 0445 06/07/24 0456  BP: (!) 114/94 110/86 133/87   Pulse: 93 72 70   Resp: 16  17   Temp: 97.7 F (36.5 C) 97.8 F (36.6 C) 97.9 F (36.6 C)   TempSrc:      SpO2: 100% 99% 100%   Weight:    59.2 kg  Height:  Intake/Output Summary (Last 24 hours) at 06/07/2024 0854 Last data filed at 06/07/2024 0537 Gross per 24 hour  Intake 4554.11 ml  Output 1000 ml  Net 3554.11 ml   Weight change:  Exam:  General:  Pt is alert, follows commands appropriately, not in acute distress HEENT: No icterus, No thrush, No neck mass, [place]/AT Cardiovascular: RRR, S1/S2, no rubs, no gallops Respiratory: Diminished breath sounds.  Bibasilar rales.  No  wheezing. Abdomen: Soft/+BS, non tender, non distended, no guarding Extremities: No edema, No lymphangitis, No petechiae, No rashes, no synovitis -Neuro:  CN II-XII intact, strength 4/5 in RUE, RLE, strength 4/5 LUE, LLE; sensation intact bilateral; no dysmetria; babinski equivocal    Data Reviewed: I have personally reviewed following labs and imaging studies Basic Metabolic Panel: Recent Labs  Lab 06/06/24 1154 06/06/24 1539 06/07/24 0423  NA 135  --  137  K 3.8  --  3.6  CL 89*  --  99  CO2 22  --  26  GLUCOSE 119*  --  89  BUN 15  --  13  CREATININE 1.26*  --  0.85  CALCIUM 9.7  --  8.5*  MG  --  1.2*  --   PHOS  --  1.8*  --    Liver Function Tests: Recent Labs  Lab 06/06/24 1154 06/07/24 0423  AST 385* 147*  ALT 70* 39  ALKPHOS 41 34*  BILITOT 0.5 0.3  PROT 7.1 5.4*  ALBUMIN 4.4 3.6   No results for input(s): LIPASE, AMYLASE in the last 168 hours. No results for input(s): AMMONIA in the last 168 hours. Coagulation Profile: Recent Labs  Lab 06/06/24 1539  INR 0.9   CBC: Recent Labs  Lab 06/06/24 1154 06/07/24 0423  WBC 3.9* 2.8*  NEUTROABS 2.2  --   HGB 12.5* 10.3*  HCT 36.9* 30.7*  MCV 92.0 91.9  PLT 119* 99*   Cardiac Enzymes: No results for input(s): CKTOTAL, CKMB, CKMBINDEX, TROPONINI in the last 168 hours. BNP: Invalid input(s): POCBNP CBG: Recent Labs  Lab 06/06/24 1147 06/07/24 0741  GLUCAP 122* 81   HbA1C: No results for input(s): HGBA1C in the last 72 hours. Urine analysis:    Component Value Date/Time   COLORURINE YELLOW 06/06/2024 2110   APPEARANCEUR HAZY (A) 06/06/2024 2110   LABSPEC 1.017 06/06/2024 2110   PHURINE 5.0 06/06/2024 2110   GLUCOSEU NEGATIVE 06/06/2024 2110   HGBUR NEGATIVE 06/06/2024 2110   BILIRUBINUR NEGATIVE 06/06/2024 2110   KETONESUR 20 (A) 06/06/2024 2110   PROTEINUR 30 (A) 06/06/2024 2110   NITRITE NEGATIVE 06/06/2024 2110   LEUKOCYTESUR NEGATIVE 06/06/2024 2110   Sepsis  Labs: @LABRCNTIP (procalcitonin:4,lacticidven:4) )No results found for this or any previous visit (from the past 240 hours).   Scheduled Meds:  feeding supplement  237 mL Oral BID BM   folic acid   1 mg Oral Daily   heparin   5,000 Units Subcutaneous Q8H   levETIRAcetam   1,000 mg Oral BID   magnesium  oxide  400 mg Oral BID   mirtazapine   15 mg Oral QHS   multivitamin with minerals  1 tablet Oral Daily   sodium chloride  flush  3 mL Intravenous Q12H   sodium chloride  flush  3 mL Intravenous Q12H   tamsulosin   0.4 mg Oral QPC supper   thiamine   100 mg Oral Daily   Or   thiamine   100 mg Intravenous Daily   Continuous Infusions:  sodium chloride  100 mL/hr at 06/07/24 0537    Procedures/Studies: CT Head  Wo Contrast Result Date: 06/06/2024 CLINICAL DATA:  Worsening headache.  Weakness.  History of seizures. EXAM: CT HEAD WITHOUT CONTRAST TECHNIQUE: Contiguous axial images were obtained from the base of the skull through the vertex without intravenous contrast. RADIATION DOSE REDUCTION: This exam was performed according to the departmental dose-optimization program which includes automated exposure control, adjustment of the mA and/or kV according to patient size and/or use of iterative reconstruction technique. COMPARISON:  10/24/2023 FINDINGS: Brain: Examination demonstrates mild age related atrophic change and minimal chronic ischemic microvascular disease which is stable. There is no mass, mass effect, shift of midline structures or acute hemorrhage. No evidence of acute infarction. Vascular: No hyperdense vessel or unexpected calcification. Skull: Normal. Negative for fracture or focal lesion. Sinuses/Orbits: No acute finding. Other: None. IMPRESSION: 1. No acute findings. 2. Mild age related atrophic change and minimal chronic ischemic microvascular disease. Electronically Signed   By: Toribio Agreste M.D.   On: 06/06/2024 13:46   DG Chest Port 1 View Result Date: 06/06/2024 CLINICAL DATA:   Weakness.  Pain. EXAM: PORTABLE CHEST 1 VIEW COMPARISON:  10/24/2023. FINDINGS: The heart size and mediastinal contours are within normal limits. Hyperinflation. No focal consolidation, pleural effusion, or pneumothorax. Remote right posterior rib fractures. No acute osseous abnormality identified. IMPRESSION: 1. No acute findings in the chest. 2. Hyperinflation. Electronically Signed   By: Harrietta Sherry M.D.   On: 06/06/2024 12:28    Alm Schneider, DO  [facility]  If 7PM-7AM, please contact night-coverage [email or web address] Password [email or web address] 06/07/2024, 8:54 AM   LOS: 0 days

## 2024-06-07 NOTE — Progress Notes (Addendum)
 Initial Nutrition Assessment  DOCUMENTATION CODES:   Severe malnutrition in context of social or environmental circumstances, Underweight  INTERVENTION:   Ensure Plus High Protein po TID, each supplement provides 350 kcal and 20 grams of protein Magic cup TID with meals, each supplement provides 290 kcal and 9 grams of protein Continue thiamine  and MVI with minerals   NUTRITION DIAGNOSIS:   Severe Malnutrition related to social / environmental circumstances (alcohol abuse) as evidenced by severe muscle depletion, severe fat depletion.  GOAL:   Patient will meet greater than or equal to 90% of their needs  MONITOR:   PO intake, Supplement acceptance  REASON FOR ASSESSMENT:   Consult Assessment of nutrition requirement/status  ASSESSMENT:   73 yo male admitted with acute metabolic encephalopathy. PMH includes alcohol abuse, tobacco abuse, HTN, seizures, alcoholic dementia, anemia.  Patient states he has been eating poorly for several weeks d/t decreased appetite. He eats cereal for breakfast, skips lunch, and has a sandwich or two for dinner. He eats snacks between meals sometimes. He drinks alcohol, but would not elaborate on the type or amount. He likes vanilla and chocolate Ensure supplements; has some at home, but has not been drinking them regularly. Encouraged good intake of meals and supplements to help prevent further loss of lean body mass.  Currently on a regular diet. Meal intakes: 50% breakfast today  Labs reviewed.  Phos 1.8 Mag 1.2 Hgb 10.3 CBG: 81  Medications reviewed and include folic acid , keppra , remeron , MVI with minerals, flomax , IV mag sulfate, IV potassium phosphate, IV thiamine . IVF: NS at 100 ml/h  Weight history reviewed.  6% weight loss since 10/25/23, not significant for the time frame. Patient is underweight with BMI=17.2.  Patient meets criteria for severe malnutrition, given severe depletion of muscle and subcutaneous fat  mass.  NUTRITION - FOCUSED PHYSICAL EXAM:  Flowsheet Row Most Recent Value  Orbital Region Mild depletion  Upper Arm Region Severe depletion  Thoracic and Lumbar Region Severe depletion  Buccal Region Moderate depletion  Temple Region Severe depletion  Clavicle Bone Region Severe depletion  Clavicle and Acromion Bone Region Severe depletion  Scapular Bone Region Moderate depletion  Dorsal Hand Moderate depletion  Patellar Region Severe depletion  Anterior Thigh Region Severe depletion  Posterior Calf Region Severe depletion  Edema (RD Assessment) None  Hair Reviewed  Eyes Reviewed  Mouth Reviewed  Skin Reviewed  Nails Reviewed    Diet Order:   Diet Order             Diet regular Room service appropriate? Yes; Fluid consistency: Thin  Diet effective now                   EDUCATION NEEDS:   Education needs have been addressed  Skin:  Skin Assessment: Reviewed RN Assessment  Last BM:  11/11  Height:   Ht Readings from Last 1 Encounters:  06/06/24 6' 1 (1.854 m)    Weight:   Wt Readings from Last 1 Encounters:  06/07/24 59.2 kg    Ideal Body Weight:  83.6 kg  BMI:  Body mass index is 17.22 kg/m.  Estimated Nutritional Needs:   Kcal:  1900-2100  Protein:  85-105 gm  Fluid:  1.9 L   Suzen HUNT RD, LDN, CNSC Contact via secure chat. If unavailable, use group chat RD Inpatient.

## 2024-06-07 NOTE — TOC Initial Note (Signed)
 Transition of Care [facility]) - Initial/Assessment Note   Patient Details  Name: Scott Jackson MRN: [id number] Date of Birth: [birth year]  [facility] CM/SW Contact:    Lucie Lunger, LCSWA Phone Number: 06/07/2024, 12:09 PM  Clinical Narrative:                 CSW updated that PT is recommending HH PT for pt at D/C. CSW spoke with pt at bedside to complete assessment. Pt states that he and his sister live together. Pt states that he is able to complete ADLs independently, his daughter is able to provide transport when needed. Pt states he has a cane and walker in the home to use if needed. Pt states he is agreeable to [facility] being arranged and does not have an agency preference. CSW spoke to Cory with [place] who states they can accept pt for [facility] PT services. TOC to follow.   Expected Discharge Plan: Home w Home Health Services Barriers to Discharge: Continued Medical Work up   Patient Goals and CMS Choice Patient states their goals for this hospitalization and ongoing recovery are:: return home CMS Medicare.gov Compare Post Acute Care list provided to:: Patient Choice offered to / list presented to : Patient      Expected Discharge Plan and Services In-house Referral: Clinical Social Work Discharge Planning Services: CM Consult Post Acute Care Choice: Home Health Living arrangements for the past 2 months: Single Family Home                           HH Arranged: PT HH Agency: [facility] Date [facility] Agency Contacted: 06/07/24   Representative spoke with at [facility] Agency: Darleene  Prior Living Arrangements/Services Living arrangements for the past 2 months: Single Family Home Lives with:: Self, Siblings Patient language and need for interpreter reviewed:: Yes Do you feel safe going back to the place where you live?: Yes      Need for Family Participation in Patient Care: Yes (Comment) Care giver support system in place?: Yes (comment) Current home services:  DME Criminal Activity/Legal Involvement Pertinent to Current Situation/Hospitalization: No - Comment as needed  Activities of Daily Living   ADL Screening (condition at time of admission) Independently performs ADLs?: Yes (appropriate for developmental age) Is the patient deaf or have difficulty hearing?: No Does the patient have difficulty seeing, even when wearing glasses/contacts?: No Does the patient have difficulty concentrating, remembering, or making decisions?: Yes  Permission Sought/Granted                  Emotional Assessment Appearance:: Appears stated age Attitude/Demeanor/Rapport: Engaged Affect (typically observed): Accepting Orientation: : Oriented to Self, Oriented to Place, Oriented to  Time, Oriented to Situation Alcohol / Substance Use: Not Applicable Psych Involvement: No (comment)  Admission diagnosis:  Syncope and collapse [R55] Altered mental status [R41.82] Patient Active Problem List   Diagnosis Date Noted   Altered mental status 06/06/2024   Transaminitis 06/06/2024   Lactic acidosis 10/25/2023   Hypomagnesemia 10/25/2023   Breakthrough seizure (HCC) 10/25/2023   Hypothermia 01/07/2022   Hypotension 01/07/2022   Hypokalemia 01/07/2022   Dehydration 01/07/2022   Hypoalbuminemia due to protein-calorie malnutrition 01/07/2022   Noncompliance with medication regimen 01/07/2022   GERD (gastroesophageal reflux disease) 01/07/2022   AKI (acute kidney injury) 10/12/2021   Acute metabolic encephalopathy 10/12/2021   Electrolyte abnormality 10/12/2021   Encephalopathy chronic 09/28/2021   Increased ammonia level  05/20/2021   History of gastritis 05/20/2021   Syncope 01/09/2021   Tobacco abuse 01/09/2021   Alcohol dependence with other alcohol-induced disorder (HCC) 05/07/2020   Seizure disorder (HCC) 05/07/2020   Anorexia nervosa (HCC) 10/16/2019   Abnormal CT scan, colon 05/13/2019   Adult failure to thrive 04/09/2019   Alcoholism with alcohol  dependence (HCC) 03/19/2019   Alcoholism associated with dementia (HCC) 03/19/2019   COPD (chronic obstructive pulmonary disease) (HCC) 03/19/2019   Abdominal pain, epigastric    Hemorrhoid 09/28/2018   Rectal bleeding 09/28/2018   Screen for sexually transmitted diseases 11/24/2017   Hyperlipidemia 11/24/2017   Essential hypertension 11/24/2017   Screening for viral disease 11/24/2017   Alcohol abuse 11/24/2017   Alcohol abuse counseling and surveillance of alcoholic 11/24/2017   PCP:  Rolinda Millman, MD Pharmacy:   [facility] - [place], [place] - [address] [place] [address] Phone: [phone] Fax: [phone]     Social Drivers of Health (SDOH) Social History: SDOH Screenings   Food Insecurity: No Food Insecurity (06/06/2024)  Housing: Low Risk  (06/06/2024)  Transportation Needs: No Transportation Needs (06/06/2024)  Utilities: Not At Risk (06/06/2024)  Social Connections: Moderately Isolated (06/06/2024)  Tobacco Use: High Risk (06/06/2024)   SDOH Interventions:     Readmission Risk Interventions     No data to display

## 2024-06-07 NOTE — Care Management Obs Status (Signed)
 MEDICARE OBSERVATION STATUS NOTIFICATION   Patient Details  Name: Scott Jackson MRN: [id number] Date of Birth: Aug 25, 1950   Medicare Observation Status Notification Given:  Yes    Phu Record L Vivika Poythress 06/07/2024, 4:50 PM

## 2024-06-07 NOTE — Evaluation (Signed)
 Occupational Therapy Evaluation Patient Details Name: Scott Jackson MRN: [id number] DOB: 1950-12-26 Today's Date: 06/07/2024   History of Present Illness   Scott Jackson is a 73 year old male with extensive history of alcohol (vodka) abuse, seizure disorder in the setting of alcohol use, depression, COPD, HTN,  COPD, GERD, tobacco abuse, failure to thrive... Noncompliant, once again presented to the ED with altered mental status.  Patient reports he had an appointment this morning that he was supposed to go with his daughter but he was feeling very dizzy and unsteady on his feet.  He passed out and did not have any seizure-like activities.  Reports that he does not drink heavy but he does drink alcohol specifically vodka his last drink was last night.  Reports that he is compliant with his medication including Keppra  (per MD)     Clinical Impressions Pt agreeable to OT and PT co-evaluation. Pt appears to be near baseline function for ADL's and functional ambulation. Supervision assist when ambulating without AD for safety only. No physical assist needed for functional ambulation or ADL's. Pt has a fall history but was not interested in a home safety assessment. Pt left in the chair with call bell within reach. Pt is not recommended for any further acute OT services and will be discharged to care of nursing staff for remaining length of stay.       If plan is discharge home, recommend the following:   Assist for transportation     Functional Status Assessment   Patient has not had a recent decline in their functional status     Equipment Recommendations   None recommended by OT     Recommendations for Other Services         Precautions/Restrictions   Precautions Precautions: Fall Recall of Precautions/Restrictions: Intact Restrictions Weight Bearing Restrictions Per Provider Order: No     Mobility Bed Mobility Overal bed mobility: Independent                   Transfers Overall transfer level: Needs assistance   Transfers: Sit to/from Stand, Bed to chair/wheelchair/BSC Sit to Stand: Supervision, Modified independent (Device/Increase time)     Step pivot transfers: Supervision, Modified independent (Device/Increase time)     General transfer comment: mild labored movement; no physical assist needed.      Balance Overall balance assessment: Mild deficits observed, not formally tested                                         ADL either performed or assessed with clinical judgement   ADL Overall ADL's : Modified independent                                       General ADL Comments: Mod I to supervision for standing ADL's based on observation of ambulation without RW. No issues with seated ADL's.     Vision Baseline Vision/History: 0 No visual deficits Ability to See in Adequate Light: 0 Adequate Patient Visual Report: No change from baseline Vision Assessment?: No apparent visual deficits     Perception Perception: Not tested       Praxis Praxis: Not tested       Pertinent Vitals/Pain Pain Assessment Pain Assessment: No/denies pain     Extremity/Trunk Assessment Upper Extremity Assessment  Upper Extremity Assessment: Overall WFL for tasks assessed   Lower Extremity Assessment Lower Extremity Assessment: Defer to PT evaluation   Cervical / Trunk Assessment Cervical / Trunk Assessment: Normal   Communication Communication Communication: No apparent difficulties   Cognition Arousal: Alert Behavior During Therapy: WFL for tasks assessed/performed Cognition: No apparent impairments                               Following commands: Intact       Cueing  General Comments   Cueing Techniques: Verbal cues                 Home Living Family/patient expects to be discharged to:: Private residence Living Arrangements: Other relatives Available Help at  Discharge: Family;Available PRN/intermittently Type of Home: House Home Access: Level entry     Home Layout: One level     Bathroom Shower/Tub: Chief Strategy Officer: Standard Bathroom Accessibility: No   Home Equipment: Cane - quad          Prior Functioning/Environment Prior Level of Function : Needs assist;History of Falls (last six months)       Physical Assist : ADLs (physical)   ADLs (physical): IADLs Mobility Comments: Community ambulator without AD ADLs Comments: Independent ADL's; assist IADL's.                            Co-evaluation PT/OT/SLP Co-Evaluation/Treatment: Yes Reason for Co-Treatment: To address functional/ADL transfers   OT goals addressed during session: ADL's and self-care      AM-PAC OT 6 Clicks Daily Activity     Outcome Measure Help from another Jackson eating meals?: None Help from another Jackson taking care of personal grooming?: None Help from another Jackson toileting, which includes using toliet, bedpan, or urinal?: None Help from another Jackson bathing (including washing, rinsing, drying)?: None Help from another Jackson to put on and taking off regular upper body clothing?: None Help from another Jackson to put on and taking off regular lower body clothing?: None 6 Click Score: 24   End of Session Equipment Utilized During Treatment: Gait belt  Activity Tolerance: Patient tolerated treatment well Patient left: in chair;with call bell/phone within reach  OT Visit Diagnosis: Unsteadiness on feet (R26.81);Other symptoms and signs involving cognitive function                Time: 0811-0827 OT Time Calculation (min): 16 min Charges:  OT General Charges $OT Visit: 1 Visit OT Evaluation $OT Eval Low Complexity: 1 Low  Scott Jackson OT, MOT  Scott Jackson 06/07/2024, 9:58 AM

## 2024-06-07 NOTE — Progress Notes (Signed)
*  PRELIMINARY RESULTS* Echocardiogram 2D Echocardiogram has been performed.  Scott Jackson 06/07/2024, 1:31 PM

## 2024-06-07 NOTE — Plan of Care (Signed)

## 2024-06-07 NOTE — Progress Notes (Signed)
 Routine EEG complete. Results pending.

## 2024-06-08 DIAGNOSIS — G40909 Epilepsy, unspecified, not intractable, without status epilepticus: Secondary | ICD-10-CM

## 2024-06-08 DIAGNOSIS — R7401 Elevation of levels of liver transaminase levels: Secondary | ICD-10-CM | POA: Diagnosis not present

## 2024-06-08 DIAGNOSIS — D61818 Other pancytopenia: Secondary | ICD-10-CM | POA: Diagnosis not present

## 2024-06-08 DIAGNOSIS — G9341 Metabolic encephalopathy: Secondary | ICD-10-CM | POA: Diagnosis not present

## 2024-06-08 LAB — COMPREHENSIVE METABOLIC PANEL WITH GFR
ALT: 33 U/L (ref 0–44)
AST: 93 U/L — ABNORMAL HIGH (ref 15–41)
Albumin: 3.3 g/dL — ABNORMAL LOW (ref 3.5–5.0)
Alkaline Phosphatase: 52 U/L (ref 38–126)
Anion gap: 10 (ref 5–15)
BUN: 12 mg/dL (ref 8–23)
CO2: 28 mmol/L (ref 22–32)
Calcium: 8.8 mg/dL — ABNORMAL LOW (ref 8.9–10.3)
Chloride: 101 mmol/L (ref 98–111)
Creatinine, Ser: 0.87 mg/dL (ref 0.61–1.24)
GFR, Estimated: >60 mL/min (ref >60)
Glucose, Bld: 104 mg/dL — ABNORMAL HIGH (ref 70–99)
Potassium: 3.6 mmol/L (ref 3.5–5.1)
Sodium: 138 mmol/L (ref 135–145)
Total Bilirubin: <0.2 mg/dL (ref 0.0–1.2)
Total Protein: 5.4 g/dL — ABNORMAL LOW (ref 6.5–8.1)

## 2024-06-08 LAB — CBC
HCT: 29.7 % — ABNORMAL LOW (ref 39.0–52.0)
Hemoglobin: 10 g/dL — ABNORMAL LOW (ref 13.0–17.0)
MCH: 31 pg (ref 26.0–34.0)
MCHC: 33.7 g/dL (ref 30.0–36.0)
MCV: 92 fL (ref 80.0–100.0)
Platelets: 115 K/uL — ABNORMAL LOW (ref 150–400)
RBC: 3.23 MIL/uL — ABNORMAL LOW (ref 4.22–5.81)
RDW: 15.8 % — ABNORMAL HIGH (ref 11.5–15.5)
WBC: 2.9 K/uL — ABNORMAL LOW (ref 4.0–10.5)
nRBC: 0 % (ref 0.0–0.2)

## 2024-06-08 LAB — LEVETIRACETAM LEVEL: Levetiracetam Lvl: 28.3 ug/mL (ref 10.0–40.0)

## 2024-06-08 LAB — PHOSPHORUS: Phosphorus: 4.3 mg/dL (ref 2.5–4.6)

## 2024-06-08 LAB — MAGNESIUM: Magnesium: 1.8 mg/dL (ref 1.7–2.4)

## 2024-06-08 LAB — GLUCOSE, CAPILLARY: Glucose-Capillary: 108 mg/dL — ABNORMAL HIGH (ref 70–99)

## 2024-06-08 NOTE — TOC Transition Note (Signed)
 Transition of Care [facility]) - Discharge Note   Patient Details  Name: Scott Jackson MRN: [id number] Date of Birth: July 10, 1951  Transition of Care [facility]) CM/SW Contact:  Noreen KATHEE Cleotilde ISRAEL Phone Number: 06/08/2024, 9:58 AM   Clinical Narrative:     Patient is DC home today with HHPT through [facility]. CSW reached out to daughter and phone went to VM. Cory with [facility] was updated on DC and orders have already been placed. ICM signing off .  Final next level of care: Home w Home Health Services Barriers to Discharge: Barriers Resolved   Patient Goals and CMS Choice Patient states their goals for this hospitalization and ongoing recovery are:: return home CMS Medicare.gov Compare Post Acute Care list provided to:: Patient Choice offered to / list presented to : Patient      Discharge Placement                  Name of family member notified: Lynwood and Jasmin ( daugter ) Patient and family notified of of transfer: 06/08/24  Discharge Plan and Services Additional resources added to the After Visit Summary for   In-house Referral: Clinical Social Work Discharge Planning Services: CM Consult Post Acute Care Choice: Home Health                    HH Arranged: PT [facility] Agency: [facility] Date [facility] Agency Contacted: 06/08/24 Time HH Agency Contacted: [phone] Representative spoke with at [facility] Agency: Darleene  Social Drivers of Health (SDOH) Interventions SDOH Screenings   Food Insecurity: No Food Insecurity (06/06/2024)  Housing: Low Risk  (06/06/2024)  Transportation Needs: No Transportation Needs (06/06/2024)  Utilities: Not At Risk (06/06/2024)  Social Connections: Moderately Isolated (06/06/2024)  Tobacco Use: High Risk (06/06/2024)     Readmission Risk Interventions     No data to display

## 2024-06-11 ENCOUNTER — Encounter: Payer: Self-pay | Admitting: Acute Care

## 2024-06-18 ENCOUNTER — Ambulatory Visit: Payer: Medicare Other | Admitting: Neurology

## 2024-06-18 ENCOUNTER — Encounter: Payer: Self-pay | Admitting: Neurology

## 2024-06-18 VITALS — BP 93/60 | HR 86 | Ht 73.0 in | Wt 138.0 lb

## 2024-06-18 DIAGNOSIS — R569 Unspecified convulsions: Secondary | ICD-10-CM | POA: Insufficient documentation

## 2024-06-18 DIAGNOSIS — E46 Unspecified protein-calorie malnutrition: Secondary | ICD-10-CM

## 2024-06-18 DIAGNOSIS — F10288 Alcohol dependence with other alcohol-induced disorder: Secondary | ICD-10-CM | POA: Diagnosis not present

## 2024-06-18 DIAGNOSIS — G9349 Other encephalopathy: Secondary | ICD-10-CM | POA: Diagnosis not present

## 2024-06-18 DIAGNOSIS — F1027 Alcohol dependence with alcohol-induced persisting dementia: Secondary | ICD-10-CM

## 2024-06-18 DIAGNOSIS — Z91148 Patient's other noncompliance with medication regimen for other reason: Secondary | ICD-10-CM | POA: Diagnosis not present

## 2024-06-18 DIAGNOSIS — E8809 Other disorders of plasma-protein metabolism, not elsewhere classified: Secondary | ICD-10-CM

## 2024-06-18 MED ORDER — LEVETIRACETAM 1000 MG PO TABS: 1000.0000 mg | ORAL_TABLET | Freq: Two times a day (BID) | ORAL | 3 refills | Status: AC

## 2024-06-18 MED ORDER — VITAMIN B-1 100 MG PO TABS: 100.0000 mg | ORAL_TABLET | Freq: Every day | ORAL | 3 refills | Status: AC

## 2024-06-18 NOTE — Progress Notes (Signed)
 Provider:  Dedra Gores, MD  Primary Care Physician:  Rolinda Millman, MD [phone] [address] [place] [address]     Referring Provider: Rolinda Millman, Md [phone] [address] [place],  [place] [address]          Chief Complaint according to patient   Patient presents with:          Pt is well, reports recent hospital visit 11/13 for dizziness. No known seizures in the last year.  Had a passing out spell -syncope (?) and fall due to alcohol withdrawal, and dehydration. Dx as encephalopathic and malnourished as well.    Not seen yet by PCP>.          HISTORY OF PRESENT ILLNESS:  Scott Jackson is a 73 y.o. male patient who is here for revisit 06/18/2024 for  withdrawal seizure. He urinated on himself and became agitated when he came back to awareness. He was confused He has COPD.   Steffen Hase is a 73 year old male with extensive history of alcohol (vodka) abuse, seizure disorder in the setting of alcohol use, depression, COPD, HTN, COPD, GERD, tobacco abuse, failure to thrive... Noncompliant, once again presented to the ED with altered mental status.  Patient reports he had an appointment this morning that he was supposed to go with his daughter but he was feeling very dizzy and unsteady on his feet.  He passed out and did not have any seizure-like activities. Reports that he does not drink heavy but he does drink alcohol specifically vodka his last drink was last night. Reports that he is compliant with his medication including Keppra    Daughter reports of witnessed loss of consciousness, fall, and seizure-like activities with tonic and atonic body , fisted hands, eyes rolled back.  Recent hospitalization and discharge for the same April 2025     ED Evaluation :  06-06-2024  BP on arrival 79/62 Blood pressure 109/70, pulse 88, resp. rate (!) 25, height 6' 1 (1.854 m), weight 63 kg, SpO2 98%. Labs: Chloride 89, glucose 119, creatinine  1.26, anion gap 24, AST 385, ALT 70, troponin < 15 x 2,, WBC 3.9, hemoglobin 12.5, serum alcohol <15, pending UDS CT head: Chronic ischemic changes atrophy, no acute intracranial abnormalities Chest x-ray -hyperinflated, no acute findings     Requested for patient to be admitted for brief altered mental status, dizziness, hypertensive.   EEG the next day was normal.      Alcohol dependence with other alcohol-induced disorder (HCC) , history of alcoholic dementia, brain atrophy, alcohol withdrawal  and seizure. Tobacco abuse.  Duodenitis due to substance.  Malnorishment and dehydration , Anemia.      Chief concern according to patient :  see above .       Fam Hx : see previous note  Social HX; see previous note       Review of Systems: Out of a complete 14 system review, the patient complains of only the following symptoms, and all other reviewed systems are negative.:   SLEEPINESS ?  How likely are you to doze in the following situations: 0 = not likely, 1 = slight chance, 2 = moderate chance, 3 = high chance      Social History   Socioeconomic History   Marital status: Legally Separated    Spouse name: Not on file   Number of children: Not on file   Years of education: Not  on file   Highest education level: Not on file  Occupational History   Not on file  Tobacco Use   Smoking status: Every Day    Current packs/day: 0.25    Average packs/day: 0.3 packs/day for 50.0 years (12.5 ttl pk-yrs)    Types: Cigarettes   Smokeless tobacco: Never  Vaping Use   Vaping status: Never Used  Substance and Sexual Activity   Alcohol use: Yes    Alcohol/week: 4.0 standard drinks of alcohol    Types: 4 Shots of liquor per week   Drug use: No   Sexual activity: Yes  Other Topics Concern   Not on file  Social History Narrative   Works at [employer] in [place], [place].   Separated.   Fishing, hunting.    Social Drivers of Corporate Investment Banker Strain: Not on  file  Food Insecurity: No Food Insecurity (06/06/2024)   Hunger Vital Sign    Worried About Running Out of Food in the Last Year: Never true    Ran Out of Food in the Last Year: Never true  Transportation Needs: No Transportation Needs (06/06/2024)   PRAPARE - Administrator, Civil Service (Medical): No    Lack of Transportation (Non-Medical): No  Physical Activity: Not on file  Stress: Not on file  Social Connections: Moderately Isolated (06/06/2024)   Social Connection and Isolation Panel    Frequency of Communication with Friends and Family: More than three times a week    Frequency of Social Gatherings with Friends and Family: More than three times a week    Attends Religious Services: More than 4 times per year    Active Member of [employer] or Organizations: No    Attends Banker Meetings: Never    Marital Status: Separated    Family History  Problem Relation Age of Onset   Hypertension Mother    Stroke Mother    Breast cancer Sister    Breast cancer Sister    Fibromyalgia Daughter    Lupus Daughter    Anxiety disorder Daughter    Post-traumatic stress disorder Daughter    Asthma Daughter    Colon cancer Neg Hx    Seizures Neg Hx     Past Medical History:  Diagnosis Date   Alcoholic dementia (HCC)    Anemia    ETOH abuse    Gastritis and duodenitis    Hypertension    Pulmonary nodule    Seizures (HCC)    Tobacco abuse     Past Surgical History:  Procedure Laterality Date   ANKLE FRACTURE SURGERY     APPENDECTOMY     BIOPSY  01/08/2019   Procedure: BIOPSY;  Surgeon: Harvey Margo CROME, MD;  [facility];  Service: Endoscopy;;  gastric   BIOPSY  11/22/2021   Procedure: BIOPSY;  Surgeon: Cindie Carlin POUR, DO;  [facility]: [facility];  Service: Endoscopy;;   COLONOSCOPY  01/2015   Dr. Tobie: two 2-66mm rectal polyps removed, hyperplastic   COLONOSCOPY  2007   Dr. Tobie: three adenomas removed measuring 4, 10, 15mm.  diverticulosis   COLONOSCOPY WITH PROPOFOL  N/A 01/08/2019   Dr. Harvey: Severe diverticulosis with fixed rectosigmoid region, rectal bleeding likely due to internal hemorrhoids.  Recommended repeat colonoscopy at [facility] because examination was incomplete due to fixed rectosigmoid colon (patient never followed through).   COLONOSCOPY WITH PROPOFOL  N/A 11/22/2021   Surgeon: Cindie Carlin POUR, DO;  nonbleeding internal hemorrhoids, diverticulosis  in the sigmoid, descending, transverse colon, 4 mm tubular adenoma in the descending colon removed.  Recommended 5-year surveillance.   ESOPHAGOGASTRODUODENOSCOPY (EGD) WITH PROPOFOL  N/A 01/08/2019   Dr. Harvey: Low-grade narrowing Schatzki ring at the GE junction, medium sized hiatal hernia, erosive gastritis/duodenitis in the setting of NSAID and alcohol use.  Gastric biopsy showed gastropathy but no H. pylori   ESOPHAGOGASTRODUODENOSCOPY (EGD) WITH PROPOFOL  N/A 11/22/2021   Surgeon: Cindie Carlin POUR, DO;   2 cm hiatal hernia, mild Schatzki's ring, gastritis biopsied, duodenitis.  Pathology with mild chronic gastritis, negative for H. pylori.   intestininal blockage  1970   POLYPECTOMY  11/22/2021   Procedure: POLYPECTOMY;  Surgeon: Cindie Carlin POUR, DO;  [facility]: [facility];  Service: Endoscopy;;     Current Outpatient Medications on File Prior to Visit  Medication Sig Dispense Refill   amLODipine  (NORVASC ) 10 MG tablet Take 1 tablet (10 mg total) by mouth daily. For BP 90 tablet 3   Cyanocobalamin  (B-12) 1000 MCG CAPS Take 1 capsule by mouth daily.     feeding supplement (ENSURE PLUS HIGH PROTEIN) LIQD Take 237 mLs by mouth 3 (three) times daily between meals.     folic acid  (FOLVITE ) 1 MG tablet Take 1 tablet (1 mg total) by mouth daily.     levETIRAcetam  (KEPPRA ) 1000 MG tablet Take 1 tablet (1,000 mg total) by mouth 2 (two) times daily. 180 tablet 3   magnesium  oxide (MAG-OX) 400 (240 Mg) MG tablet Take 1 tablet by mouth 2 (two) times  daily.     mirtazapine  (REMERON ) 15 MG tablet Take 1 tablet (15 mg total) by mouth at bedtime. 30 tablet 2   tamsulosin  (FLOMAX ) 0.4 MG CAPS capsule Take 1 capsule (0.4 mg total) by mouth daily after supper. 30 capsule 11   No current facility-administered medications on file prior to visit.    No Known Allergies   DIAGNOSTIC DATA (LABS, IMAGING, TESTING) - I reviewed patient records, labs, notes, testing and imaging myself where available.  Lab Results  Component Value Date   WBC 2.9 (L) 06/08/2024   HGB 10.0 (L) 06/08/2024   HCT 29.7 (L) 06/08/2024   MCV 92.0 06/08/2024   PLT 115 (L) 06/08/2024      Component Value Date/Time   NA 138 06/08/2024 0426   K 3.6 06/08/2024 0426   CL 101 06/08/2024 0426   CO2 28 06/08/2024 0426   GLUCOSE 104 (H) 06/08/2024 0426   BUN 12 06/08/2024 0426   CREATININE 0.87 06/08/2024 0426   CREATININE 0.89 10/16/2019 1235   CALCIUM 8.8 (L) 06/08/2024 0426   PROT 5.4 (L) 06/08/2024 0426   ALBUMIN 3.3 (L) 06/08/2024 0426   AST 93 (H) 06/08/2024 0426   ALT 33 06/08/2024 0426   ALKPHOS 52 06/08/2024 0426   BILITOT <0.2 06/08/2024 0426   GFRNONAA >60 06/08/2024 0426   GFRNONAA 74 11/23/2017 1225   GFRAA >60 12/17/2019 1717   GFRAA 85 11/23/2017 1225   Lab Results  Component Value Date   CHOL 231 (H) 06/06/2024   HDL 89 06/06/2024   LDLCALC 106 (H) 06/06/2024   TRIG 183 (H) 06/06/2024   CHOLHDL 2.6 06/06/2024   Lab Results  Component Value Date   HGBA1C 5.7 (H) 11/23/2017   Lab Results  Component Value Date   VITAMINB12 1,416 (H) 06/07/2024   Lab Results  Component Value Date   TSH 0.959 06/07/2024    PHYSICAL EXAM:  Vitals:   06/18/24 1051  BP: 93/60  Pulse: 86   No data found. Body mass index is 18.21 kg/m.   Wt Readings from Last 3 Encounters:  06/18/24 138 lb (62.6 kg)  06/08/24 129 lb 3 oz (58.6 kg)  10/25/23 138 lb 14.2 oz (63 kg)     Ht Readings from Last 3 Encounters:  06/18/24 6' 1 (1.854 m)  06/06/24 6'  1 (1.854 m)  10/25/23 6' 0.99 (1.854 m)      General: The patient is awake, alert and appears not in acute distress and groomed. Head: Normocephalic, atraumatic.  Neck is supple.  Nasal airflow  patent.   Overbite Dwan is  seen.  Dental status: poor  Cardiovascular:  Regular rate and cardiac rhythm by pulse, without distended neck veins. Respiratory: no shortness of breath  Skin:  Without evidence of ankle edema, or rash. Trunk: BMI is 18.2     NEUROLOGIC EXAM: cognitive deficits. Cranial Nerves II-XII: Intact. PERL.  nystagmus. Saccadic eye movements , no smooth pursuit.   Restricted up and downward gaze.   No facial droop.  ptosis.  Hearing is grossly intact bilaterally.  The tongue is normal and midline.  ASSESSMENT AND PLAN :   reports that he has been smoking cigarettes. He has a 42.5 pack-year smoking history. He has never used smokeless tobacco. He reports current alcohol use of about 6.0 standard drinks of alcohol 2 days a week ( binge)  . He reports that he does not use drugs.    73 y.o. year old male  here with: Alcoholism and multiple sequelea of this disease:    1) another Alcohol withdrawal induced seizure.   Keppra  cannot help that as long as the patient drinks, binges. I am happy to refill but I cannot offer any treatment , I will ask the patient to return to PCP.   2) alcohol induced dehydration, malnourishment , anemia - now on ensure at home. AST was 385 in ED, and is now 93. He is likely developing cirrhosis.    3)  In patient treatment is not affordable to the patient,  according to his daughter _   I suggested Risk Manager. She will discuss further with PCP.    No medication changes needed, only alcohol treatment, abstinence is needed.    I would like to thank  Rolinda Millman, Md [address] [place],  [place] [address] for allowing me to meet with this pleasant patient.   The patient's condition requires frequent  monitoring and adjustments in the treatment plan, reflecting the ongoing complexity of care. The patient can benefit form addiction treatment.   After spending a total time of  38  minutes face to face and time for  history taking, physical and neurologic examination, review of laboratory studies,  personal review of imaging studies, reports and results of other testing and review of referral information / records as far as provided in visit,   Electronically signed by: Dedra Gores, MD 06/18/2024 11:10 AM  [facility] Board certified by The [employer] of Sleep Medicine and Diplomate of the [employer] of Sleep Medicine. Board certified In Neurology through the ABPN, Fellow of the [employer] of Neurology.

## 2024-06-18 NOTE — Patient Instructions (Signed)
Alcohol Use Disorder Alcohol use disorder is a condition in which drinking disrupts daily life. People with this condition drink too much alcohol and cannot control their drinking. Alcohol use disorder can cause serious problems with physical health. It can affect the brain, heart, and other internal organs. This disorder can raise the risk for certain cancers and cause problems with mental health, such as depression or anxiety. What are the causes? This condition is caused by drinking too much alcohol over time. Some people with this condition drink to cope with or escape from negative life events. Others drink to relieve symptoms of physical pain or symptoms of mental illness. What increases the risk? You are more likely to develop this condition if: You have a family history of alcohol use disorder. Your culture encourages drinking to the point of becoming drunk (intoxication). You had a mood or conduct disorder in childhood. You have been abused. You are an adolescent and you: Have poor performance in school. Have poor supervision or guidance. Act on impulse and like taking risks. What are the signs or symptoms? Symptoms of this condition include: Drinking more than you want to. Trying several times without success to drink less. Spending a lot of time thinking about alcohol, getting alcohol, drinking alcohol, or recovering from drinking alcohol. Continuing to drink even when it is causing serious problems in your daily life. Drinking when it is dangerous to drink, such as before driving a car. Needing more and more alcohol to get the same effect you want (building up tolerance). Having symptoms of withdrawal when you stop drinking. Withdrawal symptoms may include: Trouble sleeping, leading to tiredness (fatigue). Mood swings of depression and anxiety. Physical symptoms, such as a fast heart rate, rapid breathing, high blood pressure (hypertension), fever, cold sweats, or  nausea. Seizures. Severe confusion. Feeling or seeing things that are not there (hallucinations). Shaking movements that you cannot control (tremors). How is this diagnosed? This condition is diagnosed with an assessment. Your health care provider may start by asking three or four questions about your drinking, or they may give you a simple test to take. This helps to get clear information from you. You may also have a physical exam or lab tests. You may be referred to a substance abuse counselor. How is this treated? With education, some people with alcohol use disorder are able to reduce their drinking. Many with this disorder cannot change their drinking behavior on their own and need help with treatment from substance use specialists. Treatments may include: Detoxification. Detoxification involves quitting drinking with supervision and direction of health care providers. Your health care provider may prescribe medicines within the first week to help lessen withdrawal symptoms. Alcohol withdrawal can be dangerous and life-threatening. Detoxification may be provided in a home, community, or primary care setting, or in a hospital or substance use treatment facility. Counseling. This may involve motivational interviewing (MI), family therapy, or cognitive behavioral therapy (CBT). A counselor can address the things you can do to change your drinking behavior and how to maintain the changes. Talk therapy aims to: Identify your positive motivations to change. Identify and avoid the things that trigger your drinking. Help you learn how to plan your behavior change. Develop support systems that can help you sustain the change. Medicines. Medicines can help treat this disorder by: Decreasing cravings. Decreasing the positive feeling you have when you drink. Causing an uncomfortable physical reaction when you drink (aversion therapy). Support groups such as Alcoholics Anonymous (AA). These groups are  led by people who have quit drinking. The groups provide emotional support, advice, and guidance. Some people with this condition benefit from a combination of treatments provided by specialized substance use treatment centers. Follow these instructions at home:  Medicines Take over-the-counter and prescription medicines only as told by your health care provider. Ask before starting any new medicines, herbs, or supplements. General instructions Ask friends and family members to support your choice to stay sober. Avoid places where alcohol is served. Create a plan to deal with tempting situations. Attend support groups regularly. Practice hobbies or activities you enjoy. Do not drink and drive. How is this prevented? Do not drink alcohol if your health care provider tells you not to drink. If you drink alcohol: Limit how much you have to: 0-1 drink a day for women who are not pregnant. 0-2 drinks a day for men. Know how much alcohol is in your drink. In the [place] one drink equals one 12 oz bottle of beer (355 mL), one 5 oz glass of wine (148 mL), or one 1 oz glass of hard liquor (44 mL). If you have a mental health condition, seek treatment. Develop a healthy lifestyle through: Meditation or deep breathing. Exercise. Spending time in nature. Listening to music. Talking with a trusted friend or family member. If you are a teen: Do not drink alcohol. Avoid gatherings where you might be tempted to drink alcohol. Do not be afraid to say no if someone offers you alcohol. Speak up about why you do not want to drink. Set a positive example for others around you by not drinking. Build relationships with friends who do not drink. Where to find more information Substance Abuse and Mental Health Services Administration: [employer] Alcoholics Anonymous: [employer] Contact a health care provider if: You cannot take your medicines as told. Your symptoms get worse or you experience symptoms of  withdrawal when you stop drinking. You start drinking again (relapse) and your symptoms get worse. Get help right away if: You have thoughts about hurting yourself or others. Get help right away if you feel like you may hurt yourself or others, or have thoughts about taking your own life. Go to your nearest emergency room or: Call 911. Call the National Suicide Prevention Lifeline at 385-054-0790 or 988. This is open 24 hours a day. Text the Crisis Text Line at 6102983571. Summary Alcohol use disorder is a condition in which drinking disrupts daily life. People with this condition drink too much alcohol and cannot control their drinking. Treatment may include detoxification, counseling, medicines, and support groups. Ask friends and family members to support you. Avoid situations where alcohol is served. Get help right away if you have thoughts about hurting yourself or others. This information is not intended to replace advice given to you by your health care provider. Make sure you discuss any questions you have with your health care provider. Document Revised: 09/15/2021 Document Reviewed: 09/15/2021 [employer] Patient Education  2024 [employer].

## 2025-06-18 ENCOUNTER — Ambulatory Visit: Admitting: Adult Health

## 2025-06-25 ENCOUNTER — Ambulatory Visit: Admitting: Adult Health
